# Patient Record
Sex: Female | Born: 1965
Health system: Southern US, Community
[De-identification: ages and names within clinical notes are randomized; demographics above are authoritative.]

## PROBLEM LIST (undated history)

## (undated) DIAGNOSIS — E785 Hyperlipidemia, unspecified: Secondary | ICD-10-CM

## (undated) DIAGNOSIS — T4145XA Adverse effect of unspecified anesthetic, initial encounter: Secondary | ICD-10-CM

## (undated) DIAGNOSIS — T8859XA Other complications of anesthesia, initial encounter: Secondary | ICD-10-CM

## (undated) DIAGNOSIS — I10 Essential (primary) hypertension: Secondary | ICD-10-CM

## (undated) DIAGNOSIS — R112 Nausea with vomiting, unspecified: Secondary | ICD-10-CM

## (undated) DIAGNOSIS — T7840XA Allergy, unspecified, initial encounter: Secondary | ICD-10-CM

## (undated) DIAGNOSIS — Z9889 Other specified postprocedural states: Secondary | ICD-10-CM

## (undated) HISTORY — DX: Hyperlipidemia, unspecified: E78.5

## (undated) HISTORY — DX: Allergy, unspecified, initial encounter: T78.40XA

## (undated) HISTORY — PX: ABDOMINAL HYSTERECTOMY: SHX81

## (undated) HISTORY — DX: Essential (primary) hypertension: I10

## (undated) HISTORY — PX: WISDOM TOOTH EXTRACTION: SHX21

---

## 1998-03-29 ENCOUNTER — Encounter: Admission: RE | Admit: 1998-03-29 | Discharge: 1998-03-29 | Payer: Self-pay | Admitting: *Deleted

## 2003-10-23 ENCOUNTER — Ambulatory Visit (HOSPITAL_COMMUNITY): Admission: RE | Admit: 2003-10-23 | Discharge: 2003-10-23 | Payer: Self-pay | Admitting: Family Medicine

## 2003-12-05 ENCOUNTER — Other Ambulatory Visit: Admission: RE | Admit: 2003-12-05 | Discharge: 2003-12-05 | Payer: Self-pay | Admitting: Obstetrics and Gynecology

## 2004-09-11 ENCOUNTER — Ambulatory Visit: Payer: Self-pay | Admitting: Family Medicine

## 2004-10-16 ENCOUNTER — Ambulatory Visit: Payer: Self-pay | Admitting: Family Medicine

## 2005-06-30 ENCOUNTER — Ambulatory Visit: Payer: Self-pay | Admitting: Family Medicine

## 2005-07-01 ENCOUNTER — Ambulatory Visit (HOSPITAL_COMMUNITY): Admission: RE | Admit: 2005-07-01 | Discharge: 2005-07-01 | Payer: Self-pay | Admitting: Family Medicine

## 2005-09-04 ENCOUNTER — Ambulatory Visit: Payer: Self-pay | Admitting: Family Medicine

## 2005-09-07 ENCOUNTER — Ambulatory Visit (HOSPITAL_COMMUNITY): Admission: RE | Admit: 2005-09-07 | Discharge: 2005-09-07 | Payer: Self-pay | Admitting: Family Medicine

## 2005-10-08 ENCOUNTER — Ambulatory Visit: Payer: Self-pay | Admitting: Family Medicine

## 2005-10-08 ENCOUNTER — Encounter (INDEPENDENT_AMBULATORY_CARE_PROVIDER_SITE_OTHER): Payer: Self-pay | Admitting: *Deleted

## 2005-10-08 LAB — CONVERTED CEMR LAB: Pap Smear: NORMAL

## 2006-03-08 ENCOUNTER — Ambulatory Visit: Payer: Self-pay | Admitting: Family Medicine

## 2006-03-08 ENCOUNTER — Ambulatory Visit (HOSPITAL_COMMUNITY): Admission: RE | Admit: 2006-03-08 | Discharge: 2006-03-08 | Payer: Self-pay | Admitting: Family Medicine

## 2006-03-12 ENCOUNTER — Ambulatory Visit: Payer: Self-pay | Admitting: Family Medicine

## 2006-04-19 ENCOUNTER — Ambulatory Visit: Payer: Self-pay | Admitting: Family Medicine

## 2006-08-10 ENCOUNTER — Ambulatory Visit: Payer: Self-pay | Admitting: Family Medicine

## 2006-11-10 ENCOUNTER — Ambulatory Visit: Payer: Self-pay | Admitting: Family Medicine

## 2006-12-07 ENCOUNTER — Encounter: Payer: Self-pay | Admitting: Family Medicine

## 2006-12-07 LAB — CONVERTED CEMR LAB
ALT: 10 units/L (ref 0–35)
AST: 13 units/L (ref 0–37)
Albumin: 3.9 g/dL (ref 3.5–5.2)
Alkaline Phosphatase: 64 units/L (ref 39–117)
BUN: 9 mg/dL (ref 6–23)
Basophils Absolute: 0 10*3/uL (ref 0.0–0.1)
Basophils Relative: 0 % (ref 0–1)
Bilirubin, Direct: 0.2 mg/dL (ref 0.0–0.3)
CO2: 25 meq/L (ref 19–32)
Calcium: 9.8 mg/dL (ref 8.4–10.5)
Chloride: 100 meq/L (ref 96–112)
Cholesterol: 238 mg/dL — ABNORMAL HIGH (ref 0–200)
Creatinine, Ser: 0.82 mg/dL (ref 0.40–1.20)
Eosinophils Absolute: 0.1 10*3/uL (ref 0.0–0.7)
Eosinophils Relative: 1 % (ref 0–5)
Glucose, Bld: 76 mg/dL (ref 70–99)
HCT: 38.1 % (ref 36.0–46.0)
HDL: 47 mg/dL (ref >39)
Hemoglobin: 12.6 g/dL (ref 12.0–15.0)
Indirect Bilirubin: 0.5 mg/dL (ref 0.0–0.9)
LDL Cholesterol: 171 mg/dL — ABNORMAL HIGH (ref 0–99)
Lymphocytes Relative: 37 % (ref 12–46)
Lymphs Abs: 3 10*3/uL (ref 0.7–3.3)
MCHC: 33.1 g/dL (ref 30.0–36.0)
MCV: 85.4 fL (ref 78.0–100.0)
Monocytes Absolute: 0.6 10*3/uL (ref 0.2–0.7)
Monocytes Relative: 8 % (ref 3–11)
Neutro Abs: 4.4 10*3/uL (ref 1.7–7.7)
Neutrophils Relative %: 55 % (ref 43–77)
Platelets: 257 10*3/uL (ref 150–400)
Potassium: 3.6 meq/L (ref 3.5–5.3)
RBC: 4.46 M/uL (ref 3.87–5.11)
RDW: 13.1 % (ref 11.5–14.0)
Sodium: 138 meq/L (ref 135–145)
Total Bilirubin: 0.7 mg/dL (ref 0.3–1.2)
Total CHOL/HDL Ratio: 5.1
Total Protein: 7.4 g/dL (ref 6.0–8.3)
Triglycerides: 98 mg/dL (ref <150)
VLDL: 20 mg/dL (ref 0–40)
WBC: 8 10*3/uL (ref 4.0–10.5)

## 2006-12-13 ENCOUNTER — Ambulatory Visit: Payer: Self-pay | Admitting: Family Medicine

## 2007-01-24 ENCOUNTER — Ambulatory Visit (HOSPITAL_COMMUNITY): Admission: RE | Admit: 2007-01-24 | Discharge: 2007-01-24 | Payer: Self-pay | Admitting: Family Medicine

## 2007-02-18 ENCOUNTER — Ambulatory Visit: Payer: Self-pay | Admitting: Family Medicine

## 2007-05-24 ENCOUNTER — Encounter: Payer: Self-pay | Admitting: Family Medicine

## 2007-05-24 LAB — CONVERTED CEMR LAB
ALT: 8 units/L (ref 0–35)
AST: 12 units/L (ref 0–37)
Albumin: 4 g/dL (ref 3.5–5.2)
Alkaline Phosphatase: 65 units/L (ref 39–117)
Bilirubin, Direct: 0.1 mg/dL (ref 0.0–0.3)
Cholesterol: 233 mg/dL — ABNORMAL HIGH (ref 0–200)
HDL: 46 mg/dL (ref >39)
Indirect Bilirubin: 0.3 mg/dL (ref 0.0–0.9)
LDL Cholesterol: 168 mg/dL — ABNORMAL HIGH (ref 0–99)
Total Bilirubin: 0.4 mg/dL (ref 0.3–1.2)
Total CHOL/HDL Ratio: 5.1
Total Protein: 6.9 g/dL (ref 6.0–8.3)
Triglycerides: 93 mg/dL (ref <150)
VLDL: 19 mg/dL (ref 0–40)

## 2007-05-31 ENCOUNTER — Ambulatory Visit: Payer: Self-pay | Admitting: Family Medicine

## 2007-09-06 ENCOUNTER — Ambulatory Visit: Payer: Self-pay | Admitting: Family Medicine

## 2007-09-06 LAB — CONVERTED CEMR LAB
ALT: 11 units/L (ref 0–35)
AST: 18 units/L (ref 0–37)
Albumin: 4 g/dL (ref 3.5–5.2)
Alkaline Phosphatase: 67 units/L (ref 39–117)
Bilirubin, Direct: 0.1 mg/dL (ref 0.0–0.3)
Cholesterol: 236 mg/dL — ABNORMAL HIGH (ref 0–200)
HDL: 50 mg/dL (ref >39)
Indirect Bilirubin: 0.6 mg/dL (ref 0.0–0.9)
LDL Cholesterol: 174 mg/dL — ABNORMAL HIGH (ref 0–99)
Total Bilirubin: 0.7 mg/dL (ref 0.3–1.2)
Total CHOL/HDL Ratio: 4.7
Total Protein: 7 g/dL (ref 6.0–8.3)
Triglycerides: 60 mg/dL (ref <150)
VLDL: 12 mg/dL (ref 0–40)

## 2007-10-06 ENCOUNTER — Encounter: Payer: Self-pay | Admitting: Family Medicine

## 2007-10-31 ENCOUNTER — Other Ambulatory Visit: Admission: RE | Admit: 2007-10-31 | Discharge: 2007-10-31 | Payer: Self-pay | Admitting: Obstetrics and Gynecology

## 2007-11-02 ENCOUNTER — Ambulatory Visit: Payer: Self-pay | Admitting: Family Medicine

## 2008-01-02 ENCOUNTER — Encounter: Payer: Self-pay | Admitting: Family Medicine

## 2008-01-02 LAB — CONVERTED CEMR LAB
ALT: 15 U/L
AST: 19 U/L
Albumin: 4.1 g/dL
Alkaline Phosphatase: 59 U/L
BUN: 12 mg/dL
Basophils Absolute: 0 K/uL
Basophils Relative: 0 %
Bilirubin, Direct: 0.1 mg/dL
CO2: 23 meq/L
Calcium: 9.4 mg/dL
Chloride: 102 meq/L
Cholesterol: 256 mg/dL — ABNORMAL HIGH
Creatinine, Ser: 0.95 mg/dL
Eosinophils Absolute: 0.1 K/uL
Eosinophils Relative: 1 %
Glucose, Bld: 76 mg/dL
HCT: 37 %
HDL: 57 mg/dL
Hemoglobin: 12.5 g/dL
Indirect Bilirubin: 0.7 mg/dL
LDL Cholesterol: 181 mg/dL — ABNORMAL HIGH
Lymphocytes Relative: 46 %
Lymphs Abs: 2.8 K/uL
MCHC: 33.8 g/dL
MCV: 88.9 fL
Monocytes Absolute: 0.5 K/uL
Monocytes Relative: 7 %
Neutro Abs: 2.8 K/uL
Neutrophils Relative %: 45 %
Platelets: 219 K/uL
Potassium: 3.4 meq/L — ABNORMAL LOW
RBC: 4.16 M/uL
RDW: 13.8 %
Sodium: 139 meq/L
Total Bilirubin: 0.8 mg/dL
Total CHOL/HDL Ratio: 4.5
Total Protein: 7 g/dL
Triglycerides: 92 mg/dL
VLDL: 18 mg/dL
WBC: 6.1 10*3/microliter

## 2008-01-05 ENCOUNTER — Ambulatory Visit: Payer: Self-pay | Admitting: Family Medicine

## 2008-01-11 ENCOUNTER — Encounter (INDEPENDENT_AMBULATORY_CARE_PROVIDER_SITE_OTHER): Payer: Self-pay | Admitting: *Deleted

## 2008-01-11 DIAGNOSIS — E785 Hyperlipidemia, unspecified: Secondary | ICD-10-CM

## 2008-02-08 ENCOUNTER — Ambulatory Visit (HOSPITAL_COMMUNITY): Admission: RE | Admit: 2008-02-08 | Discharge: 2008-02-08 | Payer: Self-pay | Admitting: Family Medicine

## 2008-04-19 ENCOUNTER — Ambulatory Visit: Payer: Self-pay | Admitting: Family Medicine

## 2008-04-21 ENCOUNTER — Encounter: Payer: Self-pay | Admitting: Family Medicine

## 2008-04-21 LAB — CONVERTED CEMR LAB
ALT: 18 units/L (ref 0–35)
AST: 19 units/L (ref 0–37)
Albumin: 4 g/dL (ref 3.5–5.2)
Alkaline Phosphatase: 64 units/L (ref 39–117)
BUN: 10 mg/dL (ref 6–23)
Bilirubin, Direct: 0.2 mg/dL (ref 0.0–0.3)
CO2: 20 meq/L (ref 19–32)
Calcium: 9.2 mg/dL (ref 8.4–10.5)
Chloride: 106 meq/L (ref 96–112)
Cholesterol: 169 mg/dL (ref 0–200)
Creatinine, Ser: 0.89 mg/dL (ref 0.40–1.20)
Glucose, Bld: 88 mg/dL (ref 70–99)
HDL: 63 mg/dL (ref >39)
Indirect Bilirubin: 0.6 mg/dL (ref 0.0–0.9)
LDL Cholesterol: 94 mg/dL (ref 0–99)
Potassium: 4.3 meq/L (ref 3.5–5.3)
Sodium: 140 meq/L (ref 135–145)
Total Bilirubin: 0.8 mg/dL (ref 0.3–1.2)
Total CHOL/HDL Ratio: 2.7
Total Protein: 6.8 g/dL (ref 6.0–8.3)
Triglycerides: 61 mg/dL (ref <150)
VLDL: 12 mg/dL (ref 0–40)

## 2008-07-27 ENCOUNTER — Telehealth: Payer: Self-pay | Admitting: Family Medicine

## 2008-07-30 ENCOUNTER — Ambulatory Visit (HOSPITAL_COMMUNITY): Admission: RE | Admit: 2008-07-30 | Discharge: 2008-07-30 | Payer: Self-pay | Admitting: Family Medicine

## 2008-07-30 ENCOUNTER — Ambulatory Visit: Payer: Self-pay | Admitting: Family Medicine

## 2008-07-30 LAB — CONVERTED CEMR LAB
Basophils Absolute: 0 10*3/uL (ref 0.0–0.1)
Basophils Relative: 0 % (ref 0–1)
Eosinophils Absolute: 0 10*3/uL (ref 0.0–0.7)
Eosinophils Relative: 0 % (ref 0–5)
HCT: 42.2 % (ref 36.0–46.0)
Hemoglobin: 13.8 g/dL (ref 12.0–15.0)
Lymphocytes Relative: 52 % — ABNORMAL HIGH (ref 12–46)
Lymphs Abs: 2.3 10*3/uL (ref 0.7–4.0)
MCHC: 32.7 g/dL (ref 30.0–36.0)
MCV: 90.2 fL (ref 78.0–100.0)
Monocytes Absolute: 0.4 10*3/uL (ref 0.1–1.0)
Monocytes Relative: 9 % (ref 3–12)
Neutro Abs: 1.7 10*3/uL (ref 1.7–7.7)
Neutrophils Relative %: 38 % — ABNORMAL LOW (ref 43–77)
Platelets: 180 10*3/uL (ref 150–400)
RBC: 4.68 M/uL (ref 3.87–5.11)
RDW: 13.2 % (ref 11.5–15.5)
WBC: 4.5 10*3/uL (ref 4.0–10.5)

## 2008-07-31 ENCOUNTER — Encounter: Payer: Self-pay | Admitting: Family Medicine

## 2009-01-01 ENCOUNTER — Other Ambulatory Visit: Admission: RE | Admit: 2009-01-01 | Discharge: 2009-01-01 | Payer: Self-pay | Admitting: Obstetrics and Gynecology

## 2009-01-10 ENCOUNTER — Ambulatory Visit: Payer: Self-pay | Admitting: Family Medicine

## 2009-01-18 ENCOUNTER — Encounter: Payer: Self-pay | Admitting: Family Medicine

## 2009-01-21 ENCOUNTER — Encounter: Payer: Self-pay | Admitting: Family Medicine

## 2009-01-21 LAB — CONVERTED CEMR LAB
BUN: 8 mg/dL (ref 6–23)
Basophils Absolute: 0 10*3/uL (ref 0.0–0.1)
Basophils Relative: 0 % (ref 0–1)
CO2: 24 meq/L (ref 19–32)
Calcium: 9.4 mg/dL (ref 8.4–10.5)
Chloride: 104 meq/L (ref 96–112)
Cholesterol: 232 mg/dL — ABNORMAL HIGH (ref 0–200)
Creatinine, Ser: 0.9 mg/dL (ref 0.40–1.20)
Eosinophils Absolute: 0.1 10*3/uL (ref 0.0–0.7)
Eosinophils Relative: 1 % (ref 0–5)
Glucose, Bld: 80 mg/dL (ref 70–99)
HCT: 40.3 % (ref 36.0–46.0)
HDL: 61 mg/dL (ref >39)
Hemoglobin: 13.5 g/dL (ref 12.0–15.0)
LDL Cholesterol: 159 mg/dL — ABNORMAL HIGH (ref 0–99)
Lymphocytes Relative: 48 % — ABNORMAL HIGH (ref 12–46)
Lymphs Abs: 2.2 10*3/uL (ref 0.7–4.0)
MCHC: 33.5 g/dL (ref 30.0–36.0)
MCV: 89.2 fL (ref 78.0–100.0)
Monocytes Absolute: 0.3 10*3/uL (ref 0.1–1.0)
Monocytes Relative: 7 % (ref 3–12)
Neutro Abs: 2 10*3/uL (ref 1.7–7.7)
Neutrophils Relative %: 44 % (ref 43–77)
Platelets: 209 10*3/uL (ref 150–400)
Potassium: 4.2 meq/L (ref 3.5–5.3)
RBC: 4.52 M/uL (ref 3.87–5.11)
RDW: 13.8 % (ref 11.5–15.5)
Sodium: 140 meq/L (ref 135–145)
TSH: 1.226 microintl units/mL (ref 0.350–4.500)
Total CHOL/HDL Ratio: 3.8
Triglycerides: 59 mg/dL (ref <150)
VLDL: 12 mg/dL (ref 0–40)
WBC: 4.7 10*3/uL (ref 4.0–10.5)

## 2009-01-22 LAB — CONVERTED CEMR LAB
ALT: 9 units/L (ref 0–35)
AST: 14 units/L (ref 0–37)
Albumin: 4.1 g/dL (ref 3.5–5.2)
Alkaline Phosphatase: 72 units/L (ref 39–117)
Bilirubin, Direct: 0.1 mg/dL (ref 0.0–0.3)
Indirect Bilirubin: 0.3 mg/dL (ref 0.0–0.9)
Total Bilirubin: 0.4 mg/dL (ref 0.3–1.2)
Total Protein: 7.1 g/dL (ref 6.0–8.3)

## 2009-02-06 ENCOUNTER — Telehealth: Payer: Self-pay | Admitting: Family Medicine

## 2010-02-27 ENCOUNTER — Other Ambulatory Visit: Admission: RE | Admit: 2010-02-27 | Discharge: 2010-02-27 | Payer: Self-pay | Admitting: Family Medicine

## 2010-02-27 ENCOUNTER — Ambulatory Visit: Payer: Self-pay | Admitting: Family Medicine

## 2010-02-27 DIAGNOSIS — B351 Tinea unguium: Secondary | ICD-10-CM

## 2010-02-27 DIAGNOSIS — R5383 Other fatigue: Secondary | ICD-10-CM

## 2010-02-27 DIAGNOSIS — R5381 Other malaise: Secondary | ICD-10-CM

## 2010-02-27 DIAGNOSIS — K5909 Other constipation: Secondary | ICD-10-CM

## 2010-02-27 LAB — CONVERTED CEMR LAB: Pap Smear: NEGATIVE

## 2010-03-10 ENCOUNTER — Ambulatory Visit (HOSPITAL_COMMUNITY): Admission: RE | Admit: 2010-03-10 | Discharge: 2010-03-10 | Payer: Self-pay | Admitting: Family Medicine

## 2010-03-11 LAB — CONVERTED CEMR LAB
ALT: 13 units/L (ref 0–35)
AST: 17 units/L (ref 0–37)
Albumin: 4.2 g/dL (ref 3.5–5.2)
Alkaline Phosphatase: 56 units/L (ref 39–117)
BUN: 10 mg/dL (ref 6–23)
Basophils Absolute: 0 10*3/uL (ref 0.0–0.1)
Basophils Relative: 0 % (ref 0–1)
Bilirubin, Direct: 0.2 mg/dL (ref 0.0–0.3)
CO2: 24 meq/L (ref 19–32)
Calcium: 10.1 mg/dL (ref 8.4–10.5)
Chloride: 104 meq/L (ref 96–112)
Cholesterol: 141 mg/dL (ref 0–200)
Creatinine, Ser: 1 mg/dL (ref 0.40–1.20)
Eosinophils Absolute: 0.1 10*3/uL (ref 0.0–0.7)
Eosinophils Relative: 1 % (ref 0–5)
Glucose, Bld: 92 mg/dL (ref 70–99)
HCT: 43.5 % (ref 36.0–46.0)
HDL: 58 mg/dL (ref >39)
Hemoglobin: 14.6 g/dL (ref 12.0–15.0)
Indirect Bilirubin: 0.6 mg/dL (ref 0.0–0.9)
LDL Cholesterol: 72 mg/dL (ref 0–99)
Lymphocytes Relative: 41 % (ref 12–46)
Lymphs Abs: 2.2 10*3/uL (ref 0.7–4.0)
MCHC: 33.6 g/dL (ref 30.0–36.0)
MCV: 89.5 fL (ref 78.0–100.0)
Monocytes Absolute: 0.5 10*3/uL (ref 0.1–1.0)
Monocytes Relative: 8 % (ref 3–12)
Neutro Abs: 2.7 10*3/uL (ref 1.7–7.7)
Neutrophils Relative %: 50 % (ref 43–77)
Platelets: 177 10*3/uL (ref 150–400)
Potassium: 4.7 meq/L (ref 3.5–5.3)
RBC: 4.86 M/uL (ref 3.87–5.11)
RDW: 13.3 % (ref 11.5–15.5)
Sodium: 136 meq/L (ref 135–145)
TSH: 1.481 microintl units/mL (ref 0.350–4.500)
Total Bilirubin: 0.8 mg/dL (ref 0.3–1.2)
Total CHOL/HDL Ratio: 2.4
Total Protein: 7 g/dL (ref 6.0–8.3)
Triglycerides: 56 mg/dL (ref <150)
VLDL: 11 mg/dL (ref 0–40)
Vit D, 25-Hydroxy: 42 ng/mL (ref 30–89)
WBC: 5.5 10*3/uL (ref 4.0–10.5)

## 2010-08-18 ENCOUNTER — Encounter: Payer: Self-pay | Admitting: Family Medicine

## 2010-09-08 ENCOUNTER — Ambulatory Visit: Payer: Self-pay | Admitting: Family Medicine

## 2010-09-08 DIAGNOSIS — J0100 Acute maxillary sinusitis, unspecified: Secondary | ICD-10-CM | POA: Insufficient documentation

## 2010-09-08 DIAGNOSIS — J209 Acute bronchitis, unspecified: Secondary | ICD-10-CM | POA: Insufficient documentation

## 2010-09-08 LAB — CONVERTED CEMR LAB
ALT: 11 units/L (ref 0–35)
AST: 14 units/L (ref 0–37)
Albumin: 4.3 g/dL (ref 3.5–5.2)
Alkaline Phosphatase: 58 units/L (ref 39–117)
Bilirubin, Direct: 0.2 mg/dL (ref 0.0–0.3)
Cholesterol: 156 mg/dL (ref 0–200)
HDL: 56 mg/dL (ref >39)
Indirect Bilirubin: 0.5 mg/dL (ref 0.0–0.9)
LDL Cholesterol: 85 mg/dL (ref 0–99)
Total Bilirubin: 0.7 mg/dL (ref 0.3–1.2)
Total CHOL/HDL Ratio: 2.8
Total Protein: 7.4 g/dL (ref 6.0–8.3)
Triglycerides: 76 mg/dL (ref <150)
VLDL: 15 mg/dL (ref 0–40)

## 2010-09-15 ENCOUNTER — Telehealth: Payer: Self-pay | Admitting: Family Medicine

## 2010-10-26 ENCOUNTER — Encounter: Payer: Self-pay | Admitting: Family Medicine

## 2010-10-27 ENCOUNTER — Encounter: Payer: Self-pay | Admitting: Family Medicine

## 2010-11-04 NOTE — Letter (Signed)
Summary: lab  lab   Imported By: Curtis Sites 03/26/2010 11:55:44  _____________________________________________________________________  External Attachment:    Type:   Image     Comment:   External Document

## 2010-11-04 NOTE — Assessment & Plan Note (Signed)
Summary: F UP   Allergies: No Known Drug Allergies   Complete Medication List: 1)  Crestor 20 Mg Tabs (Rosuvastatin calcium) .... One tab by mouth at bedtime 2)  Terbinafine Hcl 250 Mg Tabs (Terbinafine hcl) .... Take 1 tablet by mouth once a day 3)  Miralax Powd (Polyethylene glycol 3350) .Marland KitchenMarland Kitchen. 17 gms in 8 ounces of water daily 4)  Senokot S 8.6-50 Mg Tabs (Sennosides-docusate sodium) .... Take 1 tablet by mouth two times a day    pt had to leave without being seen

## 2010-11-04 NOTE — Letter (Signed)
Summary: demographic  demographic   Imported By: Curtis Sites 03/26/2010 11:55:05  _____________________________________________________________________  External Attachment:    Type:   Image     Comment:   External Document

## 2010-11-04 NOTE — Letter (Signed)
Summary: xray  xray   Imported By: Curtis Sites 03/26/2010 11:56:49  _____________________________________________________________________  External Attachment:    Type:   Image     Comment:   External Document

## 2010-11-04 NOTE — Letter (Signed)
Summary: history and physical  history and physical   Imported By: Curtis Sites 03/26/2010 11:55:26  _____________________________________________________________________  External Attachment:    Type:   Image     Comment:   External Document

## 2010-11-04 NOTE — Letter (Signed)
Summary: progress notes  progress notes   Imported By: Curtis Sites 03/26/2010 11:56:33  _____________________________________________________________________  External Attachment:    Type:   Image     Comment:   External Document

## 2010-11-04 NOTE — Letter (Signed)
Summary: Out of Work  [facility]  [address]   [place], [place] [address]   Phone: [phone]  Fax: [phone]    September 08, 2010   Employee:  GABRIANA WILMOTT    To Whom It May Concern:   For Medical reasons, please excuse the above named employee from work for the following dates:  Start:   09/08/10  End:   09/09/10 to return with no restrictions  If you need additional information, please feel free to contact our office.         Sincerely,    Milus Mallick. Lodema Hong, MD

## 2010-11-04 NOTE — Assessment & Plan Note (Signed)
Summary: physical   Vital Signs:  Patient profile:   45 year old female Menstrual status:  regular Height:      64 inches Weight:      146 pounds BMI:     25.15 O2 Sat:      98 % Pulse rate:   64 / minute Pulse rhythm:   regular Resp:     16 per minute BP sitting:   112 / 80  (left arm) Cuff size:   regular  Vitals Entered By: Everitt Amber LPN (Feb 27, 2010 2:39 PM)  Nutrition Counseling: Patient's BMI is greater than 25 and therefore counseled on weight management options. CC: CPE   Vision Screening:Left eye with correction: 20 / 20 Right eye with correction: 20 / 20 Both eyes with correction: 20 / 20  Color vision testing: normal      Vision Entered By: Everitt Amber LPN (Feb 27, 2010 2:40 PM)   CC:  CPE .  History of Present Illness: Reports  thatshe has been doing well. Denies recent fever or chills. Denies sinus pressure, nasal congestion , ear pain or sore throat. Denies chest congestion, or cough productive of sputum. Denies chest pain, palpitations, PND, orthopnea or leg swelling. Denies abdominal pain, nausea, vomitting, diarrhea she has chronic constipation for years. Denies change in bowel movements or bloody stool. Denies dysuria , frequency, incontinence or hesitancy. Denies  joint pain, swelling, or reduced mobility. Denies headaches, vertigo, seizures. Denies depression, anxiety or insomnia. Denies  rash, lesions, or itch.c/o thickening of the nails on her left foot 4th and 5th     Current Medications (verified): 1)  Crestor 20 Mg  Tabs (Rosuvastatin Calcium) .... One Tab By Mouth At Bedtime  Allergies (verified): No Known Drug Allergies  Review of Systems      See HPI Eyes:  Denies blurring and discharge. GI:  Complains of constipation; chronic constipation for years, hasbeen using colon cleanser with some relief. Derm:  Complains of changes in nail beds; thickened toenails x 1 yr. Endo:  Denies cold intolerance, excessive hunger, excessive  thirst, excessive urination, heat intolerance, polyuria, and weight change. Heme:  Denies abnormal bruising and bleeding. Allergy:  Denies hives or rash and itching eyes.  Physical Exam  General:  Well-developed,well-nourished,in no acute distress; alert,appropriate and cooperative throughout examination Head:  Normocephalic and atraumatic without obvious abnormalities. No apparent alopecia or balding. Eyes:  No corneal or conjunctival inflammation noted. EOMI. Perrla. Funduscopic exam benign, without hemorrhages, exudates or papilledema. Vision grossly normal. Ears:  External ear exam shows no significant lesions or deformities.  Otoscopic examination reveals clear canals, tympanic membranes are intact bilaterally without bulging, retraction, inflammation or discharge. Hearing is grossly normal bilaterally. Nose:  External nasal examination shows no deformity or inflammation. Nasal mucosa are pink and moist without lesions or exudates. Mouth:  Oral mucosa and oropharynx without lesions or exudates.  Teeth in good repair. Neck:  No deformities, masses, or tenderness noted. Chest Wall:  No deformities, masses, or tenderness noted. Breasts:  No mass, nodules, thickening, tenderness, bulging, retraction, inflamation, nipple discharge or skin changes noted.   Lungs:  Normal respiratory effort, chest expands symmetrically. Lungs are clear to auscultation, no crackles or wheezes. Heart:  Normal rate and regular rhythm. S1 and S2 normal without gallop, murmur, click, rub or other extra sounds. Abdomen:  Bowel sounds positive,abdomen soft and non-tender without masses, organomegaly or hernias noted. Rectal:  No external abnormalities noted. Normal sphincter tone. No rectal masses or tenderness. Genitalia:  Normal introitus for age, no external lesions, no vaginal discharge, mucosa pink and moist, no vaginal or cervical lesions, no vaginal atrophy, no friaility or hemorrhage, normal uterus size and  position, no adnexal masses or tenderness Msk:  No deformity or scoliosis noted of thoracic or lumbar spine.   Pulses:  R and L carotid,radial,femoral,dorsalis pedis and posterior tibial pulses are full and equal bilaterally Extremities:  No clubbing, cyanosis, edema, or deformity noted with normal full range of motion of all joints.   Neurologic:  No cranial nerve deficits noted. Station and gait are normal. Plantar reflexes are down-going bilaterally. DTRs are symmetrical throughout. Sensory, motor and coordinative functions appear intact. Skin:  Intact without suspicious lesions or rashesonychomycosis, mild Cervical Nodes:  No lymphadenopathy noted Axillary Nodes:  No palpable lymphadenopathy Inguinal Nodes:  No significant adenopathy Psych:  Cognition and judgment appear intact. Alert and cooperative with normal attention span and concentration. No apparent delusions, illusions, hallucinations   Impression & Recommendations:  Problem # 1:  SPECIAL SCREENING FOR MALIGNANT NEOPLASMS COLON (ICD-V76.51) Assessment Comment Only  Orders: Hemoccult Guaiac-1 spec.(in office) (82270)  Problem # 2:  SCREENING FOR MALIGNANT NEOPLASM OF THE CERVIX (ICD-V76.2) Assessment: Comment Only  Orders: Pap Smear ([address]109)  Problem # 3:  HYPERLIPIDEMIA (ICD-272.4) Assessment: Comment Only  Her updated medication list for this problem includes:    Crestor 20 Mg Tabs (Rosuvastatin calcium) ..... One tab by mouth at bedtime  Orders: T-Hepatic Function [id number]) T-Lipid Profile [phone])  Labs Reviewed: SGOT: 14 (01/21/2009)   SGPT: 9 (01/21/2009)low fat dietdiscussed and encouraged   HDL:61 (01/18/2009), 63 (04/21/2008)  LDL:159 (01/18/2009), 94 (13/05/6577)  Chol:232 (01/18/2009), 169 (04/21/2008)  Trig:59 (01/18/2009), 61 (04/21/2008)  Problem # 4:  ONYCHOMYCOSIS (ICD-110.1) Assessment: Comment Only  The following medications were removed from the medication list:    Diflucan 100 Mg  Tabs (Fluconazole) .Marland Kitchen... Take 1 tablet by mouth once a day as needed Her updated medication list for this problem includes:    Terbinafine Hcl 250 Mg Tabs (Terbinafine hcl) .Marland Kitchen... Take 1 tablet by mouth once a day  Problem # 5:  CONSTIPATION, CHRONIC (ICD-564.09) Assessment: Unchanged  Her updated medication list for this problem includes:    Miralax Powd (Polyethylene glycol 3350) .Marland KitchenMarland KitchenMarland KitchenMarland Kitchen 17 gms in 8 ounces of water daily    Senokot S 8.6-50 Mg Tabs (Sennosides-docusate sodium) .Marland Kitchen... Take 1 tablet by mouth two times a day  Discussed dietary fiber measures and increased water intake.   Complete Medication List: 1)  Crestor 20 Mg Tabs (Rosuvastatin calcium) .... One tab by mouth at bedtime 2)  Terbinafine Hcl 250 Mg Tabs (Terbinafine hcl) .... Take 1 tablet by mouth once a day 3)  Miralax Powd (Polyethylene glycol 3350) .Marland KitchenMarland Kitchen. 17 gms in 8 ounces of water daily 4)  Senokot S 8.6-50 Mg Tabs (Sennosides-docusate sodium) .... Take 1 tablet by mouth two times a day  Other Orders: T-Basic Metabolic Panel [phone]) T-TSH [phone]) T-CBC w/Diff [phone]) T-Vitamin D (25-Hydroxy) [id number]) Radiology Referral (Radiology)  Patient Instructions: 1)  F/u in 5 months and 3 weeks. 2)  It is important that you exercise regularly at least 20 minutes 5 times a week. If you develop chest pain, have severe difficulty breathing, or feel very tired , stop exercising immediately and seek medical attention. 3)  You need to lose weight. Consider a lower calorie diet and regular exercise.  4)  med is written for fungal nail infection, we will give this to you. 5)  BMP prior to  visit, ICD-9: 6)  Hepatic Panel prior to visit, ICD-9: 7)  Lipid Panel prior to visit, ICD-9: 8)  TSH prior to visit, ICD-9:   fasting next week Friday or after. 9)  your mamo isto be scheduled opn your way out 10)  uily, senokot s one twice daily and ensure you drink at least 8 glasses waterdaily, and eat alot of veg and  fruit, call for gI referral ifno better 11)  CBC w/ Diff prior to visit, ICD-9: 12)  vitamin d Prescriptions: SENOKOT S 8.6-50 MG TABS (SENNOSIDES-DOCUSATE SODIUM) Take 1 tablet by mouth two times a day  #60 x 5   Entered and Authorized by:   Syliva Overman MD   Signed by:   Syliva Overman MD on 02/27/2010   Method used:   [facility] to        [facility] [place] [phone]* (retail)       [address]       [place], [place]  [address]       Ph: [phone] or [phone]       Fax: [phone]   RxID:   [id number] MIRALAX  POWD (POLYETHYLENE GLYCOL 3350) 17 gms in 8 ounces of water daily  #510 gm x 5   Entered and Authorized by:   Syliva Overman MD   Signed by:   Syliva Overman MD on 02/27/2010   Method used:   [facility]ically to        [facility] [phone]* (retail)       [address]       [place], [place]  [address]       Ph: [phone] or [phone]       Fax: [phone]   RxID:   [id number] TERBINAFINE HCL 250 MG TABS (TERBINAFINE HCL) Take 1 tablet by mouth once a day  #90 x 0   Entered and Authorized by:   Syliva Overman MD   Signed by:   Syliva Overman MD on 02/27/2010   Method used:   Print then Give to Patient   RxID:   [id number]   Laboratory Results    Stool - Occult Blood Date: 02/27/2010 Comments: [id number] 9r 8/11 118 10 12

## 2010-11-04 NOTE — Letter (Signed)
Summary: misc.  misc.   Imported By: Curtis Sites 03/26/2010 11:56:00  _____________________________________________________________________  External Attachment:    Type:   Image     Comment:   External Document

## 2010-11-06 NOTE — Assessment & Plan Note (Signed)
Summary: SINUS   Vital Signs:  Patient profile:   45 year old female Menstrual status:  regular Height:      64 inches Weight:      140 pounds BMI:     24.12 O2 Sat:      99 % on Room air Pulse rate:   99 / minute Pulse rhythm:   regular Resp:     16 per minute BP sitting:   110 / 70  (left arm)  Vitals Entered By: Adella Hare LPN (September 08, 2010 9:15 AM)  O2 Flow:  Room air CC: head congestion, cough -x 3 weeks Is Patient Diabetic? No Pain Assessment Patient in pain? no        CC:  head congestion and cough -x 3 weeks.  History of Present Illness: 3 week h/o yellow green thick sinus drainage seemed to have been responding to mucinex but worsened. Sore throat, cough productive of yellow sputum, fever and chills to a temp of 99.9. Reports  that she had been doing well prior to this  Denies chest pain, palpitations, PND, orthopnea or leg swelling. Denies abdominal pain, nausea, vomitting, diarrhea or constipation. Denies change in bowel movements or bloody stool. Denies dysuria , frequency, incontinence or hesitancy. Denies  joint pain, swelling, or reduced mobility. Denies headaches, vertigo, seizures. Denies depression, anxiety or insomnia. Denies  rash, lesions, or itch.      Current Medications (verified): 1)  Crestor 20 Mg  Tabs (Rosuvastatin Calcium) .... One Tab By Mouth At Bedtime 2)  Miralax  Powd (Polyethylene Glycol 3350) .Marland KitchenMarland KitchenMarland Kitchen 17 Gms in 8 Ounces of Water Daily 3)  Senokot S 8.6-50 Mg Tabs (Sennosides-Docusate Sodium) .... Take 1 Tablet By Mouth Two Times A Day  Allergies (verified): No Known Drug Allergies  Review of Systems      See HPI General:  Complains of chills, fatigue, fever, malaise, and sweats. Eyes:  Denies blurring, discharge, eye pain, and red eye. Neuro:  Complains of headaches. Psych:  Denies anxiety and depression. Endo:  Denies excessive hunger, excessive thirst, and excessive urination. Heme:  Denies abnormal bruising and  bleeding. Allergy:  Denies hives or rash and itching eyes.  Physical Exam  General:  Well-developed,well-nourished,in no acute distress; alert,appropriate and cooperative throughout examination HEENT: No facial asymmetry,  EOMI, No sinus tenderness, TM's Clear, oropharynx  pink and moist.   Chest: decreased air entry bilateral crackles no wheezes CVS: S1, S2, No murmurs, No S3.   Abd: Soft, Nontender.  MS: Adequate ROM spine, hips, shoulders and knees.  Ext: No edema.   CNS: CN 2-12 intact, power tone and sensation normal throughout.   Skin: Intact, no visible lesions or rashes.  Psych: Good eye contact, normal affect.  Memory intact, not anxious or depressed appearing.    Impression & Recommendations:  Problem # 1:  ACUTE BRONCHITIS (ICD-466.0) Assessment Comment Only  Her updated medication list for this problem includes:    Levaquin 750 Mg Tabs (Levofloxacin) .Marland Kitchen... Take 1 tablet by mouth once a day  Orders: Rocephin  250mg  (G3151) Admin of Therapeutic Inj  intramuscular or subcutaneous (76160)  Problem # 2:  ACUTE SINUSITIS, UNSPECIFIED (ICD-461.9) Assessment: Comment Only  Her updated medication list for this problem includes:    Levaquin 750 Mg Tabs (Levofloxacin) .Marland Kitchen... Take 1 tablet by mouth once a day  Problem # 3:  HYPERLIPIDEMIA (ICD-272.4) Assessment: Comment Only  Her updated medication list for this problem includes:    Crestor 20 Mg Tabs (Rosuvastatin  calcium) ..... One tab by mouth at bedtime  Orders: T-Lipid Profile [phone]) T-Hepatic Function [phone]) Low fat dietdiscussed and encouraged  Labs Reviewed: SGOT: 17 (03/08/2010)   SGPT: 13 (03/08/2010)   HDL:58 (03/08/2010), 61 (01/18/2009)  LDL:72 (03/08/2010), 159 (30/86/5784)  Chol:141 (03/08/2010), 232 (01/18/2009)  Trig:56 (03/08/2010), 59 (01/18/2009)  Complete Medication List: 1)  Crestor 20 Mg Tabs (Rosuvastatin calcium) .... One tab by mouth at bedtime 2)  Miralax Powd (Polyethylene  glycol 3350) .Marland KitchenMarland Kitchen. 17 gms in 8 ounces of water daily 3)  Senokot S 8.6-50 Mg Tabs (Sennosides-docusate sodium) .... Take 1 tablet by mouth two times a day 4)  Levaquin 750 Mg Tabs (Levofloxacin) .... Take 1 tablet by mouth once a day 5)  Fluconazole 150 Mg Tabs (Fluconazole) .... Take 1 tablet by mouth once a day as needed for vaginal itch  Patient Instructions: 1)  Follow up appointment in 5.51months 2)  You are being treated for acute sinustis and bronchitis. 3)  You will get Rocephin in the opffice, and meds are sent to your pharmacy also, pls take the antibiotics as prescribed. 4)  Take 650-1000mg  of Tylenol every 4-6 hours as needed for relief of pain or comfort of fever AVOID taking more than 4000mg   in a 24 hour period (can cause liver damage in higher doses). 5)  Take 400-600mg  of Ibuprofen (Advil, Motrin) with food every 4-6 hours as needed for relief of pain or comfort of fever. 6)  Drink alot of fluids and get bed rest. 7)  We will call with any med adjustments 8)  Hepatic Panel prior to visit, ICD-9: and lipid panel 9)  Work excuse for today Prescriptions: FLUCONAZOLE 150 MG TABS (FLUCONAZOLE) Take 1 tablet by mouth once a day as needed for vaginal itch  #3 x 0   Entered and Authorized by:   Syliva Overman MD   Signed by:   Syliva Overman MD on 09/08/2010   Method used:   [facility] to        [facility]. [phone]* (retail)       [address]       [place], [place]  [address]       Ph: [phone] or [phone]       Fax: [phone]   RxID:   [id number] LEVAQUIN 750 MG TABS (LEVOFLOXACIN) Take 1 tablet by mouth once a day  #5 x 0   Entered and Authorized by:   Syliva Overman MD   Signed by:   Syliva Overman MD on 09/08/2010   Method used:   Electronically to        [facility]. [phone]* (retail)       [address]       [place], [place]  [address]       Ph: [phone] or [phone]       Fax: [phone]   RxID:    [id number]    Medication Administration  Injection # 1:    Medication: Rocephin  250mg     Diagnosis: ACUTE BRONCHITIS (ICD-466.0)    Route: IM    Site: L thigh    Exp Date: 01/14    Lot #: [id number]    Mfr: [email or web address]    Comments: rocephin 500mg  given    Patient tolerated injection without complications    Given by: Adella Hare LPN (September 08, 2010 9:52 AM)  Orders Added: 1)  Est.  Patient Level IV [[address]109] 2)  T-Lipid Profile [80061-22930] 3)  T-Hepatic Function [80076-22960] 4)  Rocephin  250mg  [J0696] 5)  Admin of Therapeutic Inj  intramuscular or subcutaneous [96372]     Medication Administration  Injection # 1:    Medication: Rocephin  250mg     Diagnosis: ACUTE BRONCHITIS (ICD-466.0)    Route: IM    Site: L thigh    Exp Date: 01/14    Lot #: [id number]    Mfr: [email or web address]    Comments: rocephin 500mg  given    Patient tolerated injection without complications    Given by: Adella Hare LPN (September 08, 2010 9:52 AM)  Orders Added: 1)  Est. Patient Level IV [98119] 2)  T-Lipid Profile [[id number]] 3)  T-Hepatic Function [30865-78469] 4)  Rocephin  250mg  [J0696] 5)  Admin of Therapeutic Inj  intramuscular or subcutaneous [62952]

## 2010-11-06 NOTE — Progress Notes (Signed)
Summary: please advise  Phone Note Call from Patient   Summary of Call: patient called in and states she thinks she might need another prescription of antibiotic called in she states she still has green mucus after taking her 5 days of antibiotic.  [phone] is the best number to contact her at.  Initial call taken by: Curtis Sites,  September 15, 2010 10:36 AM  Follow-up for Phone Call        pls let her know the antibiotic is still in her system, and in the next 5 days she should be better. If concerned that "no better' i suggest sputum culture be sent to see if she has a bacteria which is resistant to the med, pls order and have her collect container ifshe thinks this is necessary, I tried to call yesterday, no response Follow-up by: Syliva Overman MD,  September 16, 2010 4:47 AM  Additional Follow-up for Phone Call Additional follow up Details #1::        left msg for patient to return my call. Additional Follow-up by: Curtis Sites,  September 16, 2010 10:10 AM    Additional Follow-up for Phone Call Additional follow up Details #2::    PATIENT RETURNING CALL ADVISE PATIENT WHAT DR. Lodema Hong HAS REQUESTED FOR HER TO DO IF NOT BETTER IN 5 DAYS GIVE CALL BACK   Additional Follow-up for Phone Call Additional follow up Details #3:: Additional Follow-up by: Eugenio Hoes,  September 16, 2010 10:26 AM

## 2010-11-07 NOTE — Letter (Signed)
Summary: phone notes  phone notes   Imported By: Curtis Sites 03/26/2010 11:56:17  _____________________________________________________________________  External Attachment:    Type:   Image     Comment:   External Document

## 2010-12-04 ENCOUNTER — Telehealth: Payer: Self-pay | Admitting: Family Medicine

## 2010-12-10 ENCOUNTER — Ambulatory Visit: Payer: Self-pay | Admitting: Gastroenterology

## 2010-12-11 NOTE — Progress Notes (Signed)
Summary: lab  Phone Note Call from Patient   Summary of Call: pt would like to get a copy of her lab work. [phone] last time she was in.  Initial call taken by: Rudene Anda,  December 04, 2010 1:27 PM  Follow-up for Phone Call        called patient, left message mailed results Follow-up by: Adella Hare LPN,  December 05, 2010 2:27 PM

## 2011-04-23 ENCOUNTER — Other Ambulatory Visit: Payer: Self-pay | Admitting: Family Medicine

## 2011-04-23 DIAGNOSIS — Z139 Encounter for screening, unspecified: Secondary | ICD-10-CM

## 2011-04-27 ENCOUNTER — Ambulatory Visit (HOSPITAL_COMMUNITY)
Admission: RE | Admit: 2011-04-27 | Discharge: 2011-04-27 | Disposition: A | Discharge status: Home / Self Care | Payer: 59 | Source: Ambulatory Visit | Attending: Family Medicine | Admitting: Family Medicine

## 2011-04-27 DIAGNOSIS — Z139 Encounter for screening, unspecified: Secondary | ICD-10-CM

## 2011-04-27 DIAGNOSIS — Z1231 Encounter for screening mammogram for malignant neoplasm of breast: Secondary | ICD-10-CM | POA: Insufficient documentation

## 2011-05-18 ENCOUNTER — Encounter: Payer: Self-pay | Admitting: Family Medicine

## 2011-05-18 ENCOUNTER — Other Ambulatory Visit (HOSPITAL_COMMUNITY)
Admission: RE | Admit: 2011-05-18 | Discharge: 2011-05-18 | Disposition: A | Discharge status: Home / Self Care | Payer: 59 | Source: Ambulatory Visit | Attending: Family Medicine | Admitting: Family Medicine

## 2011-05-18 ENCOUNTER — Encounter: Payer: Self-pay | Admitting: *Deleted

## 2011-05-18 ENCOUNTER — Ambulatory Visit (INDEPENDENT_AMBULATORY_CARE_PROVIDER_SITE_OTHER): Payer: 59 | Admitting: Family Medicine

## 2011-05-18 VITALS — BP 130/80 | HR 54 | Ht 64.0 in | Wt 145.1 lb

## 2011-05-18 DIAGNOSIS — E785 Hyperlipidemia, unspecified: Secondary | ICD-10-CM

## 2011-05-18 DIAGNOSIS — K5909 Other constipation: Secondary | ICD-10-CM

## 2011-05-18 DIAGNOSIS — Z0000 Encounter for general adult medical examination without abnormal findings: Secondary | ICD-10-CM

## 2011-05-18 DIAGNOSIS — R5381 Other malaise: Secondary | ICD-10-CM

## 2011-05-18 DIAGNOSIS — Z1211 Encounter for screening for malignant neoplasm of colon: Secondary | ICD-10-CM

## 2011-05-18 DIAGNOSIS — Z124 Encounter for screening for malignant neoplasm of cervix: Secondary | ICD-10-CM

## 2011-05-18 DIAGNOSIS — I10 Essential (primary) hypertension: Secondary | ICD-10-CM

## 2011-05-18 DIAGNOSIS — Z13820 Encounter for screening for osteoporosis: Secondary | ICD-10-CM

## 2011-05-18 DIAGNOSIS — Z01419 Encounter for gynecological examination (general) (routine) without abnormal findings: Secondary | ICD-10-CM | POA: Insufficient documentation

## 2011-05-18 DIAGNOSIS — R5383 Other fatigue: Secondary | ICD-10-CM

## 2011-05-18 LAB — POC HEMOCCULT BLD/STL (OFFICE/1-CARD/DIAGNOSTIC): Fecal Occult Blood, POC: NEGATIVE

## 2011-05-18 MED ORDER — SENNOSIDES 8.6 MG PO TABS: 1.0000 | ORAL_TABLET | Freq: Two times a day (BID) | ORAL | Status: AC

## 2011-05-18 MED ORDER — DOCUSATE SODIUM 100 MG PO CAPS: 100.0000 mg | ORAL_CAPSULE | Freq: Two times a day (BID) | ORAL | Status: AC

## 2011-05-18 NOTE — Assessment & Plan Note (Signed)
Controlled, no change in medication  

## 2011-05-18 NOTE — Assessment & Plan Note (Signed)
Unchanged, passes little balls of stool at times, will continue daily fiber

## 2011-05-18 NOTE — Patient Instructions (Signed)
F/U in March 12, 2012 or after.  Keep up the great exercise and dietary habits.  Weight goal is 135 to 140 pounds.  Fasting labs December 6 or after.  Fasting lipid and hepatic in June 2013 3 to 7 days before visit.  You will get coupon for the crestor, and do not take metamucil with senokot if your ins will help cover that and is is more affordable

## 2011-05-18 NOTE — Progress Notes (Signed)
  Subjective:    Patient ID: Cheyenne Harris, female    DOB: 1966-06-19, 45 y.o.   MRN: [id number]  HPI The PT is here for annual exam  and re-evaluation of chronic medical conditions, medication management and review of any available recent lab and radiology data.  Preventive health is updated, specifically  Cancer screening and Immunization.   Questions or concerns regarding consultations or procedures which the PT has had in the interim are  addressed. The PT denies any adverse reactions to current medications since the last visit.  There are no new concerns.  There are no specific complaints       Review of Systems Denies recent fever or chills. Denies sinus pressure, nasal congestion, ear pain or sore throat. Denies chest congestion, productive cough or wheezing. Denies chest pains, palpitations and leg swelling Denies abdominal pain, nausea, vomiting,diarrhea or constipation.   Denies dysuria, frequency, hesitancy or incontinence. Denies joint pain, swelling and limitation in mobility. Denies headaches, seizures, numbness, or tingling. Denies depression, anxiety or insomnia. Denies skin break down or rash.       Objective:   Physical Exam Pleasant well nourished female, alert and oriented x 3, in no cardio-pulmonary distress. Afebrile. HEENT No facial trauma or asymetry. Sinuses non tender.  EOMI, PERTL. External ears normal, tympanic membranes clear. Oropharynx moist, no exudate, good dentition. Neck: supple, no adenopathy,JVD or thyromegaly.No bruits.  Chest: Clear to ascultation bilaterally.No crackles or wheezes. Non tender to palpation  Breast: No asymetry,no masses. No nipple discharge or inversion. No axillary or supraclavicular adenopathy  Cardiovascular system; Heart sounds normal,  S1 and  S2 ,no S3.  No murmur, or thrill. Apical beat not displaced Peripheral pulses normal.  Abdomen: Soft, non tender, no organomegaly or masses. No bruits. Bowel  sounds normal. No guarding, tenderness or rebound.  Rectal:  No mass. Guaiac negative stool.  GU: External genitalia normal. No lesions. Vaginal canal normal.No discharge. Uterus normal size, no adnexal masses, no cervical motion or adnexal tenderness.  Musculoskeletal exam: Full ROM of spine, hips , shoulders and knees. No deformity ,swelling or crepitus noted. No muscle wasting or atrophy.   Neurologic: Cranial nerves 2 to 12 intact. Power, tone ,sensation and reflexes normal throughout. No disturbance in gait. No tremor.  Skin: Intact, no ulceration, erythema , scaling or rash noted. Pigmentation normal throughout  Psych; Normal mood and affect. Judgement and concentration normal        Assessment & Plan:

## 2011-05-20 ENCOUNTER — Encounter: Payer: Self-pay | Admitting: *Deleted

## 2011-09-12 LAB — HEPATIC FUNCTION PANEL
ALT: 9 U/L (ref 0–35)
AST: 19 U/L (ref 0–37)
Albumin: 4 g/dL (ref 3.5–5.2)
Alkaline Phosphatase: 47 U/L (ref 39–117)
Bilirubin, Direct: 0.1 mg/dL (ref 0.0–0.3)
Indirect Bilirubin: 0.5 mg/dL (ref 0.0–0.9)
Total Bilirubin: 0.6 mg/dL (ref 0.3–1.2)
Total Protein: 7 g/dL (ref 6.0–8.3)

## 2011-09-12 LAB — CBC WITH DIFFERENTIAL/PLATELET
Basophils Absolute: 0 10*3/uL (ref 0.0–0.1)
Eosinophils Absolute: 0 10*3/uL (ref 0.0–0.7)
Eosinophils Relative: 1 % (ref 0–5)
HCT: 41.1 % (ref 36.0–46.0)
Hemoglobin: 13.9 g/dL (ref 12.0–15.0)
Lymphocytes Relative: 29 % (ref 12–46)
Lymphs Abs: 1.7 10*3/uL (ref 0.7–4.0)
MCH: 29.9 pg (ref 26.0–34.0)
MCHC: 33.8 g/dL (ref 30.0–36.0)
MCV: 88.4 fL (ref 78.0–100.0)
Monocytes Absolute: 0.4 10*3/uL (ref 0.1–1.0)
Monocytes Relative: 6 % (ref 3–12)
Neutrophils Relative %: 65 % (ref 43–77)
Platelets: 192 10*3/uL (ref 150–400)
RBC: 4.65 MIL/uL (ref 3.87–5.11)
RDW: 13.1 % (ref 11.5–15.5)
WBC: 6 10*3/uL (ref 4.0–10.5)

## 2011-09-12 LAB — LIPID PANEL
Cholesterol: 152 mg/dL (ref 0–200)
HDL: 59 mg/dL (ref >39)
LDL Cholesterol: 85 mg/dL (ref 0–99)
Total CHOL/HDL Ratio: 2.6 Ratio
Triglycerides: 40 mg/dL (ref <150)
VLDL: 8 mg/dL (ref 0–40)

## 2011-09-12 LAB — BASIC METABOLIC PANEL
BUN: 13 mg/dL (ref 6–23)
CO2: 25 mEq/L (ref 19–32)
Calcium: 9.5 mg/dL (ref 8.4–10.5)
Chloride: 103 mEq/L (ref 96–112)
Creat: 0.9 mg/dL (ref 0.50–1.10)
Glucose, Bld: 65 mg/dL — ABNORMAL LOW (ref 70–99)
Sodium: 139 mEq/L (ref 135–145)

## 2011-09-12 LAB — TSH: TSH: 1.005 u[IU]/mL (ref 0.350–4.500)

## 2011-09-17 LAB — VITAMIN D 1,25 DIHYDROXY
Vitamin D 1, 25 (OH)2 Total: 77 pg/mL — ABNORMAL HIGH (ref 18–72)
Vitamin D2 1, 25 (OH)2: <8 pg/mL
Vitamin D3 1, 25 (OH)2: 77 pg/mL

## 2011-09-21 ENCOUNTER — Telehealth: Payer: Self-pay | Admitting: Family Medicine

## 2011-09-21 NOTE — Telephone Encounter (Signed)
Labs mailed to pt on 09/18/2011

## 2011-10-07 ENCOUNTER — Telehealth: Payer: Self-pay | Admitting: Family Medicine

## 2011-10-07 NOTE — Telephone Encounter (Signed)
States she has had a sinus headache and yellow drainage for over a week. She knows its a sinus infection and wants something sent in since there are no appts  [facility]

## 2011-10-07 NOTE — Telephone Encounter (Signed)
pls advise saline nasal washes , sudafed and claritin. Ask scheduling to give work in appt for 01/07 if pt agrees.  I will not prescribe abiotics without Ov. If unable to wait till Monday, I suggest urgent care, unless we have a cancellation in the next 2 days, and she will be called in

## 2011-10-07 NOTE — Telephone Encounter (Signed)
Called patient, no answer 

## 2011-10-08 NOTE — Telephone Encounter (Signed)
Called and left message to return call. 

## 2011-10-08 NOTE — Telephone Encounter (Signed)
Pt stated that she is using nasal washes and sudafed. She states that she will go to urgent care.

## 2012-01-13 ENCOUNTER — Ambulatory Visit (INDEPENDENT_AMBULATORY_CARE_PROVIDER_SITE_OTHER): Payer: 59 | Admitting: Family Medicine

## 2012-01-13 ENCOUNTER — Encounter: Payer: Self-pay | Admitting: Family Medicine

## 2012-01-13 VITALS — BP 118/80 | HR 76 | Resp 15 | Ht 64.0 in | Wt 147.0 lb

## 2012-01-13 DIAGNOSIS — M179 Osteoarthritis of knee, unspecified: Secondary | ICD-10-CM

## 2012-01-13 DIAGNOSIS — M1710 Unilateral primary osteoarthritis, unspecified knee: Secondary | ICD-10-CM

## 2012-01-13 DIAGNOSIS — Z3040 Encounter for surveillance of contraceptives, unspecified: Secondary | ICD-10-CM

## 2012-01-13 DIAGNOSIS — R5381 Other malaise: Secondary | ICD-10-CM

## 2012-01-13 DIAGNOSIS — E785 Hyperlipidemia, unspecified: Secondary | ICD-10-CM

## 2012-01-13 DIAGNOSIS — R5383 Other fatigue: Secondary | ICD-10-CM

## 2012-01-13 MED ORDER — NORETHIN-ETH ESTRAD TRIPHASIC 0.5/0.75/1-35 MG-MCG PO TABS: 1.0000 | ORAL_TABLET | Freq: Every day | ORAL | Status: DC

## 2012-01-13 NOTE — Progress Notes (Signed)
  Subjective:    Patient ID: Cheyenne Harris, female    DOB: 1965/11/11, 46 y.o.   MRN: [id number]  HPI The PT is here for follow up and re-evaluation of chronic medical conditions, medication management and review of any available recent lab and radiology data.  Preventive health is updated, specifically  Cancer screening and Immunization.   Questions or concerns regarding consultations or procedures which the PT has had in the interim are  addressed. The PT denies any adverse reactions to current medications since the last visit.  3 to 4 month h/o increased bilateral knee pain and groin pain. Was running 25 miles per week has had to reduce to about 15. Pain and stiffness are worse after prolonged immobility. Requests contraceptive pills short term, so she will have no menstrual cycle in May when she is away on vacation. She is a non smoker and has no DVT history. Reports cycles every 24 days and they are heavy     Review of Systems See HPI Denies recent fever or chills. Denies sinus pressure, nasal congestion, ear pain or sore throat. Denies chest congestion, productive cough or wheezing. Denies chest pains, palpitations and leg swelling Denies abdominal pain, nausea, vomiting,diarrhea or constipation.   Denies dysuria, frequency, hesitancy or incontinence.  Denies headaches, seizures, numbness, or tingling. Denies depression, anxiety or insomnia. Denies skin break down or rash.        Objective:   Physical Exam Patient alert and oriented and in no cardiopulmonary distress.  HEENT: No facial asymmetry, EOMI, no sinus tenderness,  oropharynx pink and moist.  Neck supple no adenopathy.  Chest: Clear to auscultation bilaterally.  CVS: S1, S2 no murmurs, no S3.  ABD: Soft non tender. Bowel sounds normal.  Ext: No edema  MS: Adequate ROM spine, shoulders, hips and knees.  Skin: Intact, no ulcerations or rash noted.  Psych: Good eye contact, normal affect. Memory intact  not anxious or depressed appearing.  CNS: CN 2-12 intact, power, tone and sensation normal throughout.        Assessment & Plan:

## 2012-01-13 NOTE — Assessment & Plan Note (Signed)
Hyperlipidemia:Low fat diet discussed and encouraged.  Updated labs before next visit, continue current medication

## 2012-01-13 NOTE — Patient Instructions (Signed)
CPE August 13 or after, please cancel June appointment.   Start OCP on day 1 of your next cycle. Take the first 3 weeks, the go to the 2nd pack and take only the active pills , the first 3 weeks.You will bleed once you start week 4 of cycle  2 or 3 , whichever you chose. Enjoy vacation!  CBc, fastin lipid, cmp and TSH in August before you return.  Mammogram due in July, please schedule.  Ibuprofen 200mg  four tablets twice daily for 4 days then once daily as needed. Try to incorporate less weight bearing exercise as well

## 2012-01-13 NOTE — Assessment & Plan Note (Signed)
3 months of oral contraceptives prescribed, and use explained for 3 weeks continually from 1 pack to the next

## 2012-01-13 NOTE — Assessment & Plan Note (Signed)
Judicious use of anti inflammatory as well as reduction in weight bearing exercises

## 2012-03-17 ENCOUNTER — Ambulatory Visit: Payer: 59 | Admitting: Family Medicine

## 2012-05-12 ENCOUNTER — Other Ambulatory Visit: Payer: Self-pay | Admitting: Family Medicine

## 2012-05-12 DIAGNOSIS — Z139 Encounter for screening, unspecified: Secondary | ICD-10-CM

## 2012-05-16 ENCOUNTER — Ambulatory Visit (HOSPITAL_COMMUNITY)
Admission: RE | Admit: 2012-05-16 | Discharge: 2012-05-16 | Disposition: A | Discharge status: Home / Self Care | Payer: 59 | Source: Ambulatory Visit | Attending: Family Medicine | Admitting: Family Medicine

## 2012-05-16 DIAGNOSIS — Z1231 Encounter for screening mammogram for malignant neoplasm of breast: Secondary | ICD-10-CM | POA: Insufficient documentation

## 2012-05-16 DIAGNOSIS — Z139 Encounter for screening, unspecified: Secondary | ICD-10-CM

## 2012-05-17 ENCOUNTER — Other Ambulatory Visit: Payer: Self-pay | Admitting: Family Medicine

## 2012-05-18 LAB — LIPID PANEL
Cholesterol: 160 mg/dL (ref 0–200)
HDL: 57 mg/dL (ref >39)

## 2012-05-18 LAB — COMPLETE METABOLIC PANEL WITH GFR
ALT: 8 U/L (ref 0–35)
AST: 14 U/L (ref 0–37)
Calcium: 9.7 mg/dL (ref 8.4–10.5)
Chloride: 104 mEq/L (ref 96–112)
Creat: 0.9 mg/dL (ref 0.50–1.10)
Sodium: 137 mEq/L (ref 135–145)
Total Protein: 7.2 g/dL (ref 6.0–8.3)

## 2012-05-18 LAB — CBC WITH DIFFERENTIAL/PLATELET
Basophils Absolute: 0 10*3/uL (ref 0.0–0.1)
Eosinophils Relative: 1 % (ref 0–5)
Lymphocytes Relative: 35 % (ref 12–46)
MCV: 89.3 fL (ref 78.0–100.0)
Neutro Abs: 2.9 10*3/uL (ref 1.7–7.7)
Neutrophils Relative %: 55 % (ref 43–77)
Platelets: 199 10*3/uL (ref 150–400)
RDW: 13.7 % (ref 11.5–15.5)
WBC: 5.3 10*3/uL (ref 4.0–10.5)

## 2012-05-18 LAB — TSH: TSH: 1.73 u[IU]/mL (ref 0.350–4.500)

## 2012-05-23 ENCOUNTER — Encounter: Payer: 59 | Admitting: Family Medicine

## 2012-06-02 ENCOUNTER — Telehealth: Payer: Self-pay | Admitting: Family Medicine

## 2012-06-03 NOTE — Telephone Encounter (Signed)
Copy mailed.

## 2012-07-11 ENCOUNTER — Encounter: Payer: 59 | Admitting: Family Medicine

## 2012-07-31 ENCOUNTER — Other Ambulatory Visit: Payer: Self-pay | Admitting: Family Medicine

## 2013-03-13 ENCOUNTER — Other Ambulatory Visit (HOSPITAL_COMMUNITY)
Admission: RE | Admit: 2013-03-13 | Discharge: 2013-03-13 | Disposition: A | Discharge status: Home / Self Care | Payer: 59 | Source: Ambulatory Visit | Attending: Family Medicine | Admitting: Family Medicine

## 2013-03-13 ENCOUNTER — Ambulatory Visit (INDEPENDENT_AMBULATORY_CARE_PROVIDER_SITE_OTHER): Payer: 59 | Admitting: Family Medicine

## 2013-03-13 ENCOUNTER — Encounter: Payer: Self-pay | Admitting: Family Medicine

## 2013-03-13 ENCOUNTER — Telehealth: Payer: Self-pay | Admitting: Family Medicine

## 2013-03-13 VITALS — BP 110/68 | HR 72 | Resp 18 | Ht 64.0 in | Wt 143.0 lb

## 2013-03-13 DIAGNOSIS — Z1211 Encounter for screening for malignant neoplasm of colon: Secondary | ICD-10-CM

## 2013-03-13 DIAGNOSIS — D251 Intramural leiomyoma of uterus: Secondary | ICD-10-CM | POA: Insufficient documentation

## 2013-03-13 DIAGNOSIS — R5381 Other malaise: Secondary | ICD-10-CM

## 2013-03-13 DIAGNOSIS — R87810 Cervical high risk human papillomavirus (HPV) DNA test positive: Secondary | ICD-10-CM | POA: Insufficient documentation

## 2013-03-13 DIAGNOSIS — Z01419 Encounter for gynecological examination (general) (routine) without abnormal findings: Secondary | ICD-10-CM

## 2013-03-13 DIAGNOSIS — E785 Hyperlipidemia, unspecified: Secondary | ICD-10-CM

## 2013-03-13 DIAGNOSIS — R1900 Intra-abdominal and pelvic swelling, mass and lump, unspecified site: Secondary | ICD-10-CM

## 2013-03-13 DIAGNOSIS — Z1321 Encounter for screening for nutritional disorder: Secondary | ICD-10-CM

## 2013-03-13 DIAGNOSIS — Z1151 Encounter for screening for human papillomavirus (HPV): Secondary | ICD-10-CM | POA: Insufficient documentation

## 2013-03-13 DIAGNOSIS — R32 Unspecified urinary incontinence: Secondary | ICD-10-CM

## 2013-03-13 DIAGNOSIS — Z0000 Encounter for general adult medical examination without abnormal findings: Secondary | ICD-10-CM

## 2013-03-13 DIAGNOSIS — Z130 Encounter for screening for diseases of the blood and blood-forming organs and certain disorders involving the immune mechanism: Secondary | ICD-10-CM

## 2013-03-13 DIAGNOSIS — R5383 Other fatigue: Secondary | ICD-10-CM

## 2013-03-13 LAB — POCT URINALYSIS DIPSTICK
Bilirubin, UA: NEGATIVE
Blood, UA: NEGATIVE
Glucose, UA: NEGATIVE
Ketones, UA: NEGATIVE
Leukocytes, UA: NEGATIVE
pH, UA: 7

## 2013-03-13 LAB — POC HEMOCCULT BLD/STL (OFFICE/1-CARD/DIAGNOSTIC): Fecal Occult Blood, POC: NEGATIVE

## 2013-03-13 MED ORDER — SOLIFENACIN SUCCINATE 5 MG PO TABS: 5.0000 mg | ORAL_TABLET | Freq: Every day | ORAL | Status: DC

## 2013-03-13 NOTE — Progress Notes (Signed)
  Subjective:    Patient ID: Cheyenne Harris, female    DOB: 1966/07/10, 47 y.o.   MRN: [id number]  HPI The PT is here for annual exam  and re-evaluation of chronic medical conditions, medication management and review of any available recent lab and radiology data.  Preventive health is updated, specifically  Cancer screening and Immunization.    The PT denies any adverse reactions to current medications since the last visit.  C/o worsening urge incontinence and is interested in medication for symptom control    Review of Systems    See HPI Denies recent fever or chills. Denies sinus pressure, nasal congestion, ear pain or sore throat. Denies chest congestion, productive cough or wheezing. Denies chest pains, palpitations and leg swelling Denies abdominal pain, nausea, vomiting,diarrhea or constipation.    Denies joint pain, swelling and limitation in mobility. Denies headaches, seizures, numbness, or tingling. Denies depression, anxiety or insomnia. Denies skin break down or rash.     Objective:   Physical Exam Pleasant well nourished female, alert and oriented x 3, in no cardio-pulmonary distress. Afebrile. HEENT No facial trauma or asymetry. Sinuses non tender.  EOMI, PERTL, fundoscopic exam is normal, no hemorhage or exudate.  External ears normal, tympanic membranes clear. Oropharynx moist, no exudate, good dentition. Neck: supple, no adenopathy,JVD or thyromegaly.No bruits.  Chest: Clear to ascultation bilaterally.No crackles or wheezes. Non tender to palpation  Breast: No asymetry,no masses. No nipple discharge or inversion. No axillary or supraclavicular adenopathy  Cardiovascular system; Heart sounds normal,  S1 and  S2 ,no S3.  No murmur, or thrill. Apical beat not displaced Peripheral pulses normal.  Abdomen: Soft, non tender, no organomegaly or masses. No bruits. Bowel sounds normal. No guarding, tenderness or rebound.  Rectal:  No mass. Guaiac  negative stool.  GU: External genitalia normal. No lesions. Vaginal canal normal.physiologic  discharge. Uterus enlarged to approx 16 week size, no adnexal masses, no cervical motion or adnexal tenderness.  Musculoskeletal exam: Full ROM of spine, hips , shoulders and knees. No deformity ,swelling or crepitus noted. No muscle wasting or atrophy.   Neurologic: Cranial nerves 2 to 12 intact. Power, tone ,sensation and reflexes normal throughout. No disturbance in gait. No tremor.  Skin: Intact, no ulceration, erythema , scaling or rash noted. Pigmentation normal throughout  Psych; Normal mood and affect. Judgement and concentration normal        Assessment & Plan:

## 2013-03-13 NOTE — Patient Instructions (Addendum)
F/u in 3 month, call if you need me before.  Urine is being tested for infection and you will start medication vesicare, to help with incontinence, if not successful I will refer you to urologist for further evaluation  You are referred for a pelvic US , your womb is enlarged, which may be contributing too the problem with urine  Please do the exercises discussed to strengthen bladder muscles and empty your bladder regularly on a schedule to reduce accidents. If no success you will be referred to urology  Check with your insurance to see if screening colonoscopy is covered, recommended for African American community starting at age 10, call back to let me know if it is , so I can refer you  Fasting CBC.lipid, cmp, and TSH and Vit D end August

## 2013-03-14 NOTE — Telephone Encounter (Signed)
Patient is aware 

## 2013-03-20 ENCOUNTER — Other Ambulatory Visit (HOSPITAL_COMMUNITY): Payer: 59

## 2013-03-20 ENCOUNTER — Ambulatory Visit (HOSPITAL_COMMUNITY): Payer: 59

## 2013-03-20 ENCOUNTER — Ambulatory Visit (HOSPITAL_COMMUNITY)
Admission: RE | Admit: 2013-03-20 | Discharge: 2013-03-20 | Disposition: A | Discharge status: Home / Self Care | Payer: 59 | Source: Ambulatory Visit | Attending: Family Medicine | Admitting: Family Medicine

## 2013-03-20 DIAGNOSIS — R1900 Intra-abdominal and pelvic swelling, mass and lump, unspecified site: Secondary | ICD-10-CM

## 2013-03-20 DIAGNOSIS — D259 Leiomyoma of uterus, unspecified: Secondary | ICD-10-CM | POA: Insufficient documentation

## 2013-03-20 DIAGNOSIS — R1909 Other intra-abdominal and pelvic swelling, mass and lump: Secondary | ICD-10-CM | POA: Insufficient documentation

## 2013-03-25 DIAGNOSIS — Z0000 Encounter for general adult medical examination without abnormal findings: Secondary | ICD-10-CM | POA: Insufficient documentation

## 2013-03-25 NOTE — Assessment & Plan Note (Signed)
Large fibroids, refer for [place] to further eval

## 2013-03-25 NOTE — Assessment & Plan Note (Signed)
Pelvic , breast and rectal exam as documented. Pt encouraged to contiue healthy lifestyle re diet,and exercise

## 2013-03-25 NOTE — Assessment & Plan Note (Signed)
Worsening urge incontinence, likely due to fibroid, UA is negative for infection

## 2013-05-08 ENCOUNTER — Telehealth: Payer: Self-pay | Admitting: Family Medicine

## 2013-05-08 DIAGNOSIS — Z3040 Encounter for surveillance of contraceptives, unspecified: Secondary | ICD-10-CM

## 2013-05-08 MED ORDER — NORETHIN-ETH ESTRAD TRIPHASIC 0.5/0.75/1-35 MG-MCG PO TABS: 1.0000 | ORAL_TABLET | Freq: Every day | ORAL | Status: DC

## 2013-05-08 NOTE — Telephone Encounter (Signed)
rx sent

## 2013-05-23 ENCOUNTER — Other Ambulatory Visit: Payer: Self-pay | Admitting: Family Medicine

## 2013-05-23 DIAGNOSIS — Z139 Encounter for screening, unspecified: Secondary | ICD-10-CM

## 2013-05-29 ENCOUNTER — Ambulatory Visit (HOSPITAL_COMMUNITY)
Admission: RE | Admit: 2013-05-29 | Discharge: 2013-05-29 | Disposition: A | Discharge status: Home / Self Care | Payer: 59 | Source: Ambulatory Visit | Attending: Family Medicine | Admitting: Family Medicine

## 2013-05-29 DIAGNOSIS — Z1231 Encounter for screening mammogram for malignant neoplasm of breast: Secondary | ICD-10-CM | POA: Insufficient documentation

## 2013-05-29 DIAGNOSIS — Z139 Encounter for screening, unspecified: Secondary | ICD-10-CM

## 2013-06-06 ENCOUNTER — Other Ambulatory Visit: Payer: Self-pay | Admitting: Family Medicine

## 2013-06-06 DIAGNOSIS — R928 Other abnormal and inconclusive findings on diagnostic imaging of breast: Secondary | ICD-10-CM

## 2013-06-20 ENCOUNTER — Other Ambulatory Visit: Payer: Self-pay | Admitting: Family Medicine

## 2013-06-20 ENCOUNTER — Ambulatory Visit (INDEPENDENT_AMBULATORY_CARE_PROVIDER_SITE_OTHER): Payer: 59 | Admitting: Family Medicine

## 2013-06-20 VITALS — BP 122/66 | HR 78 | Resp 18 | Ht 64.0 in | Wt 150.0 lb

## 2013-06-20 DIAGNOSIS — E785 Hyperlipidemia, unspecified: Secondary | ICD-10-CM

## 2013-06-20 DIAGNOSIS — J0110 Acute frontal sinusitis, unspecified: Secondary | ICD-10-CM | POA: Insufficient documentation

## 2013-06-20 LAB — LIPID PANEL
HDL: 66 mg/dL (ref >39)
LDL Cholesterol: 98 mg/dL (ref 0–99)

## 2013-06-20 LAB — COMPREHENSIVE METABOLIC PANEL
AST: 14 U/L (ref 0–37)
Albumin: 3.8 g/dL (ref 3.5–5.2)
Alkaline Phosphatase: 41 U/L (ref 39–117)
BUN: 9 mg/dL (ref 6–23)
Calcium: 9.5 mg/dL (ref 8.4–10.5)
Chloride: 104 mEq/L (ref 96–112)
Glucose, Bld: 84 mg/dL (ref 70–99)
Potassium: 4.6 mEq/L (ref 3.5–5.3)
Sodium: 135 mEq/L (ref 135–145)
Total Protein: 7.3 g/dL (ref 6.0–8.3)

## 2013-06-20 LAB — CBC
HCT: 39 % (ref 36.0–46.0)
Hemoglobin: 13 g/dL (ref 12.0–15.0)
MCHC: 33.3 g/dL (ref 30.0–36.0)
WBC: 7.9 10*3/uL (ref 4.0–10.5)

## 2013-06-20 MED ORDER — FLUCONAZOLE 150 MG PO TABS: ORAL_TABLET | ORAL | Status: AC

## 2013-06-20 MED ORDER — LEVOFLOXACIN 750 MG PO TABS: 750.0000 mg | ORAL_TABLET | Freq: Every day | ORAL | Status: AC

## 2013-06-20 NOTE — Patient Instructions (Addendum)
F/u ni mid January, call if you need me before  Today please go directly to lab for bloodwork , we will reprint order form for you  Today, please go to the radiology dept and schedule the further imaging on the right breast that you need  Medication is sent in for frontal sinus infection, levaquin for 5 days. Fill and take fluconazole only if you develop vaginal itch/irritation after antibiotics from fungal infection  Call and return in 2.5 to 3 weeks for flu vaccine , you need this

## 2013-06-20 NOTE — Progress Notes (Signed)
  Subjective:    Patient ID: Cheyenne Harris, female    DOB: Sep 20, 1966, 47 y.o.   MRN: [id number]  HPI 10 day h/o increased frontal pressure with yellow green nasal drainage, chills, no fever, throat irritarted no ear pain or productive cough. Prior to this she has been well, continues to remain active and to maintain a healthy weight   Review of Systems See HPI Denies recent fever or chills. Denies sinus pressure, nasal congestion, ear pain or sore throat. Denies chest congestion, productive cough or wheezing. Denies chest pains, palpitations and leg swelling Denies abdominal pain, nausea, vomiting,diarrhea or constipation.   Denies dysuria, frequency, hesitancy or incontinence. Denies joint pain, swelling and limitation in mobility. Denies headaches, seizures, numbness, or tingling. Denies depression, anxiety or insomnia. Denies skin break down or rash.        Objective:   Physical Exam  Patient alert and oriented and in no cardiopulmonary distress.  HEENT: No facial asymmetry, EOMI, frontal  sinus tenderness,  oropharynx pink and moist.  Neck supple no adenopathy.TM clear  Chest: Clear to auscultation bilaterally.  CVS: S1, S2 no murmurs, no S3.  ABD: Soft non tender. Bowel sounds normal.  Ext: No edema  MS: Adequate ROM spine, shoulders, hips and knees.  Skin: Intact, no ulcerations or rash noted.  Psych: Good eye contact, normal affect. Memory intact not anxious or depressed appearing.  CNS: CN 2-12 intact, power, tone and sensation normal throughout.       Assessment & Plan:

## 2013-06-21 ENCOUNTER — Other Ambulatory Visit: Payer: Self-pay | Admitting: Family Medicine

## 2013-06-21 LAB — TSH: TSH: 3.345 u[IU]/mL (ref 0.350–4.500)

## 2013-06-23 ENCOUNTER — Other Ambulatory Visit: Payer: Self-pay

## 2013-06-23 ENCOUNTER — Telehealth: Payer: Self-pay | Admitting: Family Medicine

## 2013-06-23 MED ORDER — ROSUVASTATIN CALCIUM 20 MG PO TABS: 20.0000 mg | ORAL_TABLET | Freq: Every day | ORAL | Status: DC

## 2013-06-23 NOTE — Telephone Encounter (Signed)
Called patient back and no answer. Left a message that Labs were normal

## 2013-06-25 ENCOUNTER — Encounter: Payer: Self-pay | Admitting: Family Medicine

## 2013-06-25 NOTE — Assessment & Plan Note (Signed)
Hyperlipidemia:Low fat diet discussed and encouraged.  Updated lab past due and will be reviewed as soon as available

## 2013-06-25 NOTE — Assessment & Plan Note (Signed)
Antibiotic course prescribed 

## 2013-07-05 ENCOUNTER — Ambulatory Visit (HOSPITAL_COMMUNITY)
Admission: RE | Admit: 2013-07-05 | Discharge: 2013-07-05 | Disposition: A | Discharge status: Home / Self Care | Payer: 59 | Source: Ambulatory Visit | Attending: Family Medicine | Admitting: Family Medicine

## 2013-07-05 DIAGNOSIS — R928 Other abnormal and inconclusive findings on diagnostic imaging of breast: Secondary | ICD-10-CM | POA: Insufficient documentation

## 2013-08-10 ENCOUNTER — Other Ambulatory Visit: Payer: Self-pay

## 2013-10-26 ENCOUNTER — Ambulatory Visit: Payer: 59 | Admitting: Family Medicine

## 2014-10-09 ENCOUNTER — Telehealth: Payer: Self-pay | Admitting: *Deleted

## 2014-10-09 DIAGNOSIS — N939 Abnormal uterine and vaginal bleeding, unspecified: Secondary | ICD-10-CM

## 2014-10-09 NOTE — Telephone Encounter (Signed)
Please advise 

## 2014-10-09 NOTE — Telephone Encounter (Signed)
Needs OV , by next week, in the interim needs tSH, chem 7 and CBC   How many months has this been going on for? Probvera 5 mg twice daily is prescribed for 5 days, pls send in after you speak with her, this should stop the current bleed, after this needs to start  Birth control pill, ortho tricyclen one daily as new pack  pls send this after you spk with her ensure she understands Will start orhtoJan 10 or after I will refer her for pelvic US after she comes in, most likely bleeding is due to hormonal chnages, which are sometimes aggravated by stress

## 2014-10-09 NOTE — Telephone Encounter (Signed)
Pt called requesting something to stop menstrual cycle, pt said she starts for 2 weeks stops for a week and on again for 2 weeks, pt said she is having to change tampons every 2 hours. Pt is concerned if something is going on, pt requesting medication for it. Please advise [phone]

## 2014-10-10 ENCOUNTER — Other Ambulatory Visit: Payer: Self-pay

## 2014-10-10 MED ORDER — MEDROXYPROGESTERONE ACETATE 5 MG PO TABS: 5.0000 mg | ORAL_TABLET | Freq: Every day | ORAL | Status: DC

## 2014-10-10 MED ORDER — NORGESTIM-ETH ESTRAD TRIPHASIC 0.18/0.215/0.25 MG-25 MCG PO TABS: 1.0000 | ORAL_TABLET | Freq: Every day | ORAL | Status: DC

## 2014-10-10 NOTE — Telephone Encounter (Signed)
Called and left message for patient to return call.  

## 2014-10-10 NOTE — Addendum Note (Signed)
Addended by: Denman George B on: 10/10/2014 10:58 AM   Modules accepted: Orders

## 2014-10-10 NOTE — Telephone Encounter (Signed)
Patient aware and will have labs done prior to 1/12 appt.  Medications sent to pharmacy

## 2014-10-11 ENCOUNTER — Encounter: Payer: Self-pay | Admitting: Family Medicine

## 2014-10-11 LAB — CBC
HCT: 38 % (ref 36.0–46.0)
HEMOGLOBIN: 12.3 g/dL (ref 12.0–15.0)
MCH: 28.2 pg (ref 26.0–34.0)
MCHC: 32.4 g/dL (ref 30.0–36.0)
MCV: 87.2 fL (ref 78.0–100.0)
MPV: 12.1 fL (ref 8.6–12.4)
Platelets: 229 10*3/uL (ref 150–400)
RBC: 4.36 MIL/uL (ref 3.87–5.11)
RDW: 15.3 % (ref 11.5–15.5)
WBC: 6.1 10*3/uL (ref 4.0–10.5)

## 2014-10-11 LAB — COMPREHENSIVE METABOLIC PANEL
ALT: 10 U/L (ref 0–35)
AST: 18 U/L (ref 0–37)
Albumin: 3.9 g/dL (ref 3.5–5.2)
Alkaline Phosphatase: 61 U/L (ref 39–117)
BILIRUBIN TOTAL: 0.5 mg/dL (ref 0.2–1.2)
BUN: 13 mg/dL (ref 6–23)
CO2: 26 meq/L (ref 19–32)
CREATININE: 1.01 mg/dL (ref 0.50–1.10)
Calcium: 9.5 mg/dL (ref 8.4–10.5)
Chloride: 102 mEq/L (ref 96–112)
GLUCOSE: 82 mg/dL (ref 70–99)
Potassium: 4.2 mEq/L (ref 3.5–5.3)
SODIUM: 139 meq/L (ref 135–145)
Total Protein: 6.8 g/dL (ref 6.0–8.3)

## 2014-10-11 LAB — TSH: TSH: 1.619 u[IU]/mL (ref 0.350–4.500)

## 2014-10-16 ENCOUNTER — Ambulatory Visit: Payer: Self-pay | Admitting: Family Medicine

## 2014-10-19 ENCOUNTER — Ambulatory Visit (INDEPENDENT_AMBULATORY_CARE_PROVIDER_SITE_OTHER): Payer: 59 | Admitting: Family Medicine

## 2014-10-19 ENCOUNTER — Encounter: Payer: Self-pay | Admitting: Family Medicine

## 2014-10-19 VITALS — BP 110/74 | HR 82 | Temp 98.7°F | Resp 16 | Ht 64.0 in | Wt 147.0 lb

## 2014-10-19 DIAGNOSIS — D251 Intramural leiomyoma of uterus: Secondary | ICD-10-CM

## 2014-10-19 DIAGNOSIS — N924 Excessive bleeding in the premenopausal period: Secondary | ICD-10-CM

## 2014-10-19 DIAGNOSIS — N920 Excessive and frequent menstruation with regular cycle: Secondary | ICD-10-CM | POA: Insufficient documentation

## 2014-10-19 DIAGNOSIS — J111 Influenza due to unidentified influenza virus with other respiratory manifestations: Secondary | ICD-10-CM

## 2014-10-19 DIAGNOSIS — N921 Excessive and frequent menstruation with irregular cycle: Secondary | ICD-10-CM

## 2014-10-19 DIAGNOSIS — J1189 Influenza due to unidentified influenza virus with other manifestations: Secondary | ICD-10-CM

## 2014-10-19 DIAGNOSIS — E785 Hyperlipidemia, unspecified: Secondary | ICD-10-CM

## 2014-10-19 DIAGNOSIS — N3941 Urge incontinence: Secondary | ICD-10-CM

## 2014-10-19 MED ORDER — OSELTAMIVIR PHOSPHATE 75 MG PO CAPS: 75.0000 mg | ORAL_CAPSULE | Freq: Two times a day (BID) | ORAL | Status: DC

## 2014-10-19 NOTE — Assessment & Plan Note (Signed)
Acute onset of febrile illness with body aches. Tamiflu prescribed, fluids and rest. Pt does not return to work before 10/23/2014. She will call if she needs an extended period out after this

## 2014-10-19 NOTE — Progress Notes (Signed)
   Subjective:    Patient ID: Cheyenne Harris, female    DOB: December 12, 1965, 49 y.o.   MRN: [id number]  HPI 2 day h/o acute onset of chills , fever, generalized body aches and sore throat. Has headache, denies neck stiffness. No nasal drainage or productive cough. Did not take the flu vaccine, no known sick contact, temp up to 102 5 month h/o irregular bleeding , at times with heayy menses, no excessive new stress, has fibroids. She has noted occasional night sweats in the past several months, she was placed on provera and then has recently started OCP, bleeding is less, currently spotting, will refer to gyne for further management Labs were checked, she is not anemic and her thyroid function is normal   Review of Systems See HPI  Denies chest pains, palpitations and leg swelling Denies abdominal pain, nausea, vomiting,diarrhea or constipation.   Denies dysuria, frequency, hesitancy has chronic incontinence, likely due to the her fibroids.  Denies headaches, seizures, numbness, or tingling. Denies depression, anxiety or insomnia. Denies skin break down or rash.        Objective:   Physical Exam BP 110/74 mmHg  Pulse 82  Temp(Src) 98.7 F (37.1 C) (Oral)  Resp 16  Ht 5\' 4"  (1.626 m)  Wt 147 lb (66.679 kg)  BMI 25.22 kg/m2  SpO2 99%  Patient alert and oriented and in no cardiopulmonary distress.Looks ill, chills  HEENT: No facial asymmetry, EOMI,   oropharynx pink and moist.  Neck supple no JVD, no mass. TM clear bilaterally Chest: Clear to auscultation bilaterally.  CVS: S1, S2 no murmurs, no S3.Regular rate.  ABD: Soft non tender.   Ext: No edema  MS: Adequate ROM spine, shoulders, hips and knees.  Skin: Intact, no ulcerations or rash noted.  Psych: Good eye contact, normal affect. Memory intact not anxious or depressed appearing.  CNS: CN 2-12 intact, power,  normal throughout.no focal deficits noted.        Assessment & Plan:  Influenza caused by  unspecified influenza virus Acute onset of febrile illness with body aches. Tamiflu prescribed, fluids and rest. Pt does not return to work before 10/23/2014. She will call if she needs an extended period out after this    Heavy periods 5 month h/o heavy and irregular periods , with possible hot flashes at night. Likely perimenopausal with bleeding complicated by fibroids Pt referred to gyne for further management . Basic labs are normal Five day course of provera has helped to reduce bleeding temoporarily, and she is now spotting on OCP, her fibroids increase the risk of recurrent flooding   Hyperlipidemia LDL goal <100 Hyperlipidemia:Low fat diet discussed and encouraged.  Updated lab needed at/ before next visit. Pt has not taken a statin for over 14 month reportedly   Urinary incontinence Unchnaged, fibroids likely contributing

## 2014-10-19 NOTE — Assessment & Plan Note (Signed)
5 month h/o heavy and irregular periods , with possible hot flashes at night. Likely perimenopausal with bleeding complicated by fibroids Pt referred to gyne for further management . Basic labs are normal Five day course of provera has helped to reduce bleeding temoporarily, and she is now spotting on OCP, her fibroids increase the risk of recurrent flooding

## 2014-10-19 NOTE — Patient Instructions (Signed)
F/u in 4 month, call if you need me before   You have irregular periods due to perienopause, and also fibroids are complicating this  You are referred to Dr Eual Fines are being treated for influenza  Fasting lipid in 4 month  Influenza Influenza ("the flu") is a viral infection of the respiratory tract. It occurs more often in winter months because people spend more time in close contact with one another. Influenza can make you feel very sick. Influenza easily spreads from person to person (contagious). CAUSES  Influenza is caused by a virus that infects the respiratory tract. You can catch the virus by breathing in droplets from an infected person's cough or sneeze. You can also catch the virus by touching something that was recently contaminated with the virus and then touching your mouth, nose, or eyes. RISKS AND COMPLICATIONS You may be at risk for a more severe case of influenza if you smoke cigarettes, have diabetes, have chronic heart disease (such as heart failure) or lung disease (such as asthma), or if you have a weakened immune system. Elderly people and pregnant women are also at risk for more serious infections. The most common problem of influenza is a lung infection (pneumonia). Sometimes, this problem can require emergency medical care and may be life threatening. SIGNS AND SYMPTOMS  Symptoms typically last 4 to 10 days and may include:  Fever.  Chills.  Headache, body aches, and muscle aches.  Sore throat.  Chest discomfort and cough.  Poor appetite.  Weakness or feeling tired.  Dizziness.  Nausea or vomiting. DIAGNOSIS  Diagnosis of influenza is often made based on your history and a physical exam. A nose or throat swab test can be done to confirm the diagnosis. TREATMENT  In mild cases, influenza goes away on its own. Treatment is directed at relieving symptoms. For more severe cases, your health care provider may prescribe antiviral medicines to shorten  the sickness. Antibiotic medicines are not effective because the infection is caused by a virus, not by bacteria. HOME CARE INSTRUCTIONS  Take medicines only as directed by your health care provider.  Use a cool mist humidifier to make breathing easier.  Get plenty of rest until your temperature returns to normal. This usually takes 3 to 4 days.  Drink enough fluid to keep your urine clear or pale yellow.  Cover yourmouth and nosewhen coughing or sneezing,and wash your handswellto prevent thevirusfrom spreading.  Stay homefromwork orschool untilthe fever is gonefor at least 12full day. PREVENTION  An annual influenza vaccination (flu shot) is the best way to avoid getting influenza. An annual flu shot is now routinely recommended for all adults in the [place] IF:  You experiencechest pain, yourcough worsens,or you producemore mucus.  Youhave nausea,vomiting, ordiarrhea.  Your fever returns or gets worse. SEEK IMMEDIATE MEDICAL CARE IF:  You havetrouble breathing, you become short of breath,or your skin ornails becomebluish.  You have severe painor stiffnessin the neck.  You develop a sudden headache, or pain in the face or ear.  You have nausea or vomiting that you cannot control. MAKE SURE YOU:   Understand these instructions.  Will watch your condition.  Will get help right away if you are not doing well or get worse. Document Released: 09/18/2000 Document Revised: 02/05/2014 Document Reviewed: 12/21/2011 [facility] Patient Information 2015 [employer], [place]. This information is not intended to replace advice given to you by your health care provider. Make sure you discuss any questions you  have with your health care provider.  

## 2014-10-19 NOTE — Assessment & Plan Note (Signed)
Hyperlipidemia:Low fat diet discussed and encouraged.  Updated lab needed at/ before next visit. Pt has not taken a statin for over 14 month reportedly

## 2014-10-19 NOTE — Assessment & Plan Note (Signed)
Unchnaged, fibroids likely contributing

## 2014-10-30 ENCOUNTER — Telehealth: Payer: Self-pay | Admitting: Obstetrics and Gynecology

## 2014-10-30 NOTE — Telephone Encounter (Signed)
Call made to patient who is concerned over bleeding, and has called dr Griffin Dakin office for evaluation.  I left a message for pt to call our office in the morning, and we'll make appt this week

## 2014-10-30 NOTE — Telephone Encounter (Signed)
Thanks for your follow up

## 2014-11-07 ENCOUNTER — Encounter: Payer: Self-pay | Admitting: Obstetrics and Gynecology

## 2014-11-07 ENCOUNTER — Ambulatory Visit (INDEPENDENT_AMBULATORY_CARE_PROVIDER_SITE_OTHER): Payer: 59 | Admitting: Obstetrics and Gynecology

## 2014-11-07 VITALS — BP 140/100 | Ht 64.0 in | Wt 151.0 lb

## 2014-11-07 DIAGNOSIS — D259 Leiomyoma of uterus, unspecified: Secondary | ICD-10-CM

## 2014-11-07 MED ORDER — MEGESTROL ACETATE 40 MG PO TABS: 40.0000 mg | ORAL_TABLET | Freq: Three times a day (TID) | ORAL | Status: DC

## 2014-11-07 NOTE — Progress Notes (Signed)
Patient ID: [name], female   DOB: [birth year], 49 y.o.   MRN: [id number] Pt here today for prolonged period. Pt states that she has been bleeding since November 27th and has maybe stopped for about 2 weeks since then. Pt states that she has had cramping and some pain.    [facility] Visit  Patient name: Cheyenne Harris MRN [id number]  Date of birth: 05/30/1966  CC & HPI:  Cheyenne Harris is a 49 y.o. female presenting today for prolonged period. Bleeding since the end of November. Bleeding heavy with clots. Has had to change tampons every 2 hours. Taking OCP's now with no change. Since 10/14/14.   ROS:  Seen by Dr Cheyenne Harris, 1/10 and placed on ocp, no change in bleeding. + incr fatigue  Pertinent History Reviewed:   Reviewed: Significant for last pap 2 yr ago Medical         Past Medical History  Diagnosis Date  . Hyperlipidemia   . Hypertension                               Surgical Hx:   History reviewed. No pertinent past surgical history. Medications: Reviewed & Updated - see associated section                       Current outpatient prescriptions:  .  KRILL OIL PO, Take 1 capsule by mouth daily., Disp: , Rfl:  .  loratadine (CLARITIN) 10 MG tablet, Take 10 mg by mouth daily., Disp: , Rfl:  .  Multiple Vitamins-Minerals (MULTIVITAMIN WITH MINERALS) tablet, Take 1 tablet by mouth daily., Disp: , Rfl:  .  Norgestimate-Ethinyl Estradiol Triphasic (ORTHO TRI-CYCLEN LO) 0.18/0.215/0.25 MG-25 MCG tab, Take 1 tablet by mouth daily., Disp: 1 Package, Rfl: 4 .  psyllium (METAMUCIL) 58.6 % powder, Take 1 packet by mouth as needed.  , Disp: , Rfl:    Social History: Reviewed -  reports that she has never smoked. She has never used smokeless tobacco.  Objective Findings:  Vitals: Blood pressure 140/100, height 5\' 4"  (1.626 m), weight 151 lb (68.493 kg), last menstrual period 08/31/2014.  Physical Examination: General appearance - alert, well appearing, and in no distress,  oriented to person, place, and time and dehydrated Mental status - alert, oriented to person, place, and time, normal mood, behavior, speech, dress, motor activity, and thought processes Abdomen - soft, nontender, nondistended, no masses or organomegaly Pelvic - VULVA: normal appearing vulva with no masses, tenderness or lesions, VAGINA: normal appearing vagina with normal color and discharge, no lesions, HEAVY FLOW, 10 CC OF WATERY BLOOD IN VAULT ,NO CLOTS  PELVIC FLOOR EXAM: no cystocele, rectocele or prolapse noted, bulbous cervix well supported, CERVIX: normal appearing cervix without discharge or lesions, no discharge noted, multiparous os, UTERUS: enlarged to 16-18 week's size, est weight 800 gm , ADNEXA: normal adnexa in size, nontender and no masses, exam limited by ut enlargement, exam chaperoned by AMT   Assessment & Plan:   A:   1. Fibroid uterus enlargement 2  Heavy and prolonged menses(AUB) 3  Needs pap PPlan: 1. Pelvic u/s              2. Megace 40 tid             3. Endometrial biopsy             4 consider lupron supression  5.     Anemia panel

## 2014-11-08 ENCOUNTER — Telehealth: Payer: Self-pay | Admitting: Obstetrics and Gynecology

## 2014-11-08 LAB — IRON AND TIBC
IRON: 68 ug/dL (ref 27–159)
Iron Saturation: 16 % (ref 15–55)
TIBC: 435 ug/dL (ref 250–450)
UIBC: 367 ug/dL (ref 131–425)

## 2014-11-08 LAB — CBC WITH DIFFERENTIAL/PLATELET
Basophils Absolute: 0 10*3/uL (ref 0.0–0.2)
Basos: 1 %
EOS ABS: 0.1 10*3/uL (ref 0.0–0.4)
EOS: 1 %
HEMATOCRIT: 36.8 % (ref 34.0–46.6)
Hemoglobin: 12.4 g/dL (ref 11.1–15.9)
IMMATURE GRANULOCYTES: 0 %
Immature Grans (Abs): 0 10*3/uL (ref 0.0–0.1)
LYMPHS ABS: 1.8 10*3/uL (ref 0.7–3.1)
Lymphs: 37 %
MCH: 29 pg (ref 26.6–33.0)
MCHC: 33.7 g/dL (ref 31.5–35.7)
MCV: 86 fL (ref 79–97)
MONOCYTES: 7 %
MONOS ABS: 0.3 10*3/uL (ref 0.1–0.9)
NEUTROS PCT: 54 %
Neutrophils Absolute: 2.6 10*3/uL (ref 1.4–7.0)
PLATELETS: 336 10*3/uL (ref 150–379)
RBC: 4.27 x10E6/uL (ref 3.77–5.28)
RDW: 14.3 % (ref 12.3–15.4)
WBC: 4.9 10*3/uL (ref 3.4–10.8)

## 2014-11-08 LAB — FOLATE: FOLATE: 18.7 ng/mL (ref >3.0)

## 2014-11-08 LAB — VITAMIN B12: Vitamin B-12: 1295 pg/mL — ABNORMAL HIGH (ref 211–946)

## 2014-11-08 LAB — FERRITIN: FERRITIN: 13 ng/mL — AB (ref 15–150)

## 2014-11-16 ENCOUNTER — Other Ambulatory Visit: Payer: 59 | Admitting: Obstetrics and Gynecology

## 2014-11-16 ENCOUNTER — Other Ambulatory Visit: Payer: Self-pay | Admitting: Obstetrics and Gynecology

## 2014-11-16 ENCOUNTER — Ambulatory Visit (INDEPENDENT_AMBULATORY_CARE_PROVIDER_SITE_OTHER): Payer: 59

## 2014-11-16 DIAGNOSIS — N852 Hypertrophy of uterus: Secondary | ICD-10-CM

## 2014-11-16 DIAGNOSIS — D259 Leiomyoma of uterus, unspecified: Secondary | ICD-10-CM

## 2014-11-16 DIAGNOSIS — N939 Abnormal uterine and vaginal bleeding, unspecified: Secondary | ICD-10-CM

## 2014-11-19 ENCOUNTER — Other Ambulatory Visit: Payer: 59 | Admitting: Obstetrics and Gynecology

## 2014-11-28 ENCOUNTER — Encounter: Payer: Self-pay | Admitting: Obstetrics and Gynecology

## 2014-11-28 ENCOUNTER — Other Ambulatory Visit (HOSPITAL_COMMUNITY)
Admission: RE | Admit: 2014-11-28 | Discharge: 2014-11-28 | Disposition: A | Discharge status: Home / Self Care | Payer: 59 | Source: Ambulatory Visit | Attending: Obstetrics and Gynecology | Admitting: Obstetrics and Gynecology

## 2014-11-28 ENCOUNTER — Ambulatory Visit (INDEPENDENT_AMBULATORY_CARE_PROVIDER_SITE_OTHER): Payer: 59 | Admitting: Obstetrics and Gynecology

## 2014-11-28 VITALS — BP 128/80 | Ht 64.0 in | Wt 153.0 lb

## 2014-11-28 DIAGNOSIS — Z01419 Encounter for gynecological examination (general) (routine) without abnormal findings: Secondary | ICD-10-CM

## 2014-11-28 DIAGNOSIS — Z1151 Encounter for screening for human papillomavirus (HPV): Secondary | ICD-10-CM | POA: Diagnosis present

## 2014-11-28 DIAGNOSIS — Z1212 Encounter for screening for malignant neoplasm of rectum: Secondary | ICD-10-CM

## 2014-11-28 LAB — HEMOCCULT GUIAC POC 1CARD (OFFICE): Fecal Occult Blood, POC: NEGATIVE

## 2014-11-28 MED ORDER — LEUPROLIDE ACETATE 3.75 MG IM KIT: 11.2500 mg | PACK | Freq: Once | INTRAMUSCULAR | Status: DC

## 2014-11-28 NOTE — Progress Notes (Signed)
Patient ID: Cheyenne Harris, female   DOB: 25-Nov-1965, 49 y.o.   MRN: [id number]  Assessment:  Annual Gyn Exam  stable fibroid uterus 16 wk size pt desires to proceed toward removal Will suppress with lupron Plan:  1. pap smear done, next pap due 3 yr 2. return annually or prn 3    Annual mammogram advised Subjective:  Cheyenne Harris is a 49 y.o. female No obstetric history on file. who presents for annual exam. Patient's last menstrual period was 08/31/2014. The patient has complaints today of pressure ffrom fibriods. Pt works out regularly. Has high cholesterol above 240's  The following portions of the patient's history were reviewed and updated as appropriate: allergies, current medications, past family history, past medical history, past social history, past surgical history and problem list. Past Medical History  Diagnosis Date  . Hyperlipidemia   . Hypertension   Dr Moshe Cipro  History reviewed. No pertinent past surgical history.   Current outpatient prescriptions:  .  KRILL OIL PO, Take 1 capsule by mouth daily., Disp: , Rfl:  .  loratadine (CLARITIN) 10 MG tablet, Take 10 mg by mouth daily., Disp: , Rfl:  .  megestrol (MEGACE) 40 MG tablet, Take 1 tablet (40 mg total) by mouth 3 (three) times daily., Disp: 45 tablet, Rfl: 2 .  Multiple Vitamins-Minerals (MULTIVITAMIN WITH MINERALS) tablet, Take 1 tablet by mouth daily., Disp: , Rfl:  .  psyllium (METAMUCIL) 58.6 % powder, Take 1 packet by mouth as needed.  , Disp: , Rfl:  .  Norgestimate-Ethinyl Estradiol Triphasic (ORTHO TRI-CYCLEN LO) 0.18/0.215/0.25 MG-25 MCG tab, Take 1 tablet by mouth daily. (Patient not taking: Reported on 11/28/2014), Disp: 1 Package, Rfl: 4  Review of Systems Constitutional: negative Gastrointestinal: suprapubic pressure Genitourinary: nocturia rarely, days q< 2hr in days  Objective:  BP 128/80 mmHg  Ht 5\' 4"  (1.626 m)  Wt 153 lb (69.4 kg)  BMI 26.25 kg/m2  LMP 08/31/2014   BMI: Body mass index  is 26.25 kg/(m^2).  General Appearance: Alert, appropriate appearance for age. No acute distress HEENT: Grossly normal Neck / Thyroid:  Cardiovascular: RRR; normal S1, S2, no murmur Lungs: CTA bilaterally Back: No CVAT Breast Exam: No dimpling, nipple retraction or discharge. No masses or nodes. and No masses or nodes.No dimpling, nipple retraction or discharge. Gastrointestinal: Soft, non-tender, no masses or organomegaly Pelvic Exam: Vulva and vagina appear normal. Bimanual exam reveals normal uterus and adnexa. External genitalia: normal general appearance Vaginal: normal mucosa without prolapse or lesions Cervix: normal appearance and small Uterus: irregular enlargement and 16 wk Rectovaginal: normal rectal, no masses and guaiac negative stool obtained Lymphatic Exam: Non-palpable nodes in neck, clavicular, axillary, or inguinal regions Skin: no rash or abnormalities Neurologic: Normal gait and speech, no tremor  Psychiatric: Alert and oriented, appropriate affect.  Urinalysis:Not done GYNECOLOGIC SONOGRAM   Cheyenne Harris is a 49 y.o. for a pelvic sonogram for AUB, enlarged uterus h/o fibroids  Uterus 15.2 x 10.9 x 10.5 cm, multiple fibroids noted,   Fibroids- #1- 4.5 x 3.2 cm  #2-7.5 x 5.4 cm  #3-6.4 x 4.9 cm  #4-3.3 x 1.6 cm  Endometrium 6.7 mm distorted by fibroids  Right ovary 3.7 x 2.6 x 1.8 cm,   Left ovary 3.6 x 2.0 x 2.0 cm,   No free fluid or adnexal masses noted within the pelvis  Technician Comments:  Anteverted uterus, fibroids noted, Endometrium distorted by fibroids, bilateral adnexa/ovaries appear WNL   Lazarus Gowda 11/16/2014 10:51 AM  Clinical Impression and recommendations:  I have reviewed the sonogram results above.  Combined with the patient's current clinical course, below are my impressions and any appropriate recommendations for management  based on the sonographic findings:  1. Enlarged uterus, estimated weigh 920+ gram uterine size. 2 Normal endometrium for premenopausal female 3. Norma adnexal structures  Wendall Mola. MD Pgr [phone] 2:58 PM

## 2014-12-03 LAB — CYTOLOGY - PAP

## 2014-12-05 ENCOUNTER — Telehealth: Payer: Self-pay | Admitting: Obstetrics and Gynecology

## 2014-12-05 NOTE — Telephone Encounter (Signed)
Pt aware that prior authorization has been sent to her insurance and should be back in 24- 72 hours. Pt was advised that as soon as I knew something I would call her back. Pt verbalized understanding.

## 2014-12-06 NOTE — Telephone Encounter (Signed)
Pt aware that Rx has been approved.

## 2014-12-12 ENCOUNTER — Encounter: Payer: Self-pay | Admitting: *Deleted

## 2014-12-12 ENCOUNTER — Ambulatory Visit (INDEPENDENT_AMBULATORY_CARE_PROVIDER_SITE_OTHER): Payer: 59 | Admitting: *Deleted

## 2014-12-12 DIAGNOSIS — D259 Leiomyoma of uterus, unspecified: Secondary | ICD-10-CM

## 2014-12-12 DIAGNOSIS — Z3202 Encounter for pregnancy test, result negative: Secondary | ICD-10-CM | POA: Diagnosis not present

## 2014-12-12 DIAGNOSIS — D251 Intramural leiomyoma of uterus: Secondary | ICD-10-CM

## 2014-12-12 DIAGNOSIS — Z3200 Encounter for pregnancy test, result unknown: Secondary | ICD-10-CM

## 2014-12-12 LAB — POCT URINE PREGNANCY: Preg Test, Ur: NEGATIVE

## 2014-12-12 MED ORDER — LEUPROLIDE ACETATE (3 MONTH) 11.25 MG IM KIT
11.2500 mg | PACK | Freq: Once | INTRAMUSCULAR | Status: AC
  Administered 2014-12-12: 11.25 mg via INTRAMUSCULAR

## 2014-12-12 NOTE — Progress Notes (Signed)
Patient ID: Cheyenne Harris, female   DOB: 1966/08/16, 49 y.o.   MRN: [id number] Pt here today for First Lupron injection. Pt given injection in right deltoid and tolerated well.

## 2015-02-06 ENCOUNTER — Encounter: Payer: Self-pay | Admitting: Obstetrics and Gynecology

## 2015-02-06 ENCOUNTER — Ambulatory Visit (INDEPENDENT_AMBULATORY_CARE_PROVIDER_SITE_OTHER): Payer: 59 | Admitting: Obstetrics and Gynecology

## 2015-02-06 VITALS — BP 120/80 | Ht 64.0 in | Wt 150.0 lb

## 2015-02-06 DIAGNOSIS — D251 Intramural leiomyoma of uterus: Secondary | ICD-10-CM | POA: Diagnosis not present

## 2015-02-06 NOTE — Progress Notes (Signed)
Patient ID: Cheyenne Harris, female   DOB: 1966-03-20, 49 y.o.   MRN: [id number]   [facility] Visit  Patient name: Cheyenne Harris MRN [id number]  Date of birth: 1966/03/13  CC & HPI:  Cheyenne Harris is a 49 y.o. female presenting today for follow-up on her Lupron injection, last given about 12/12/14, per medical records. Patient continues to work out regularly. Patient does reports a history of abnormal pap smears, although they were not precancerous.  ROS:  A complete 10 system review of systems was obtained and all systems are negative except as noted in the HPI and PMH.    Pertinent History Reviewed:   Reviewed: Significant for n/a Medical         Past Medical History  Diagnosis Date  . Hyperlipidemia   . Hypertension                               Surgical Hx:   History reviewed. No pertinent past surgical history. Medications: Reviewed & Updated - see associated section                       Current outpatient prescriptions:  .  KRILL OIL PO, Take 1 capsule by mouth daily., Disp: , Rfl:  .  leuprolide (LUPRON) 3.75 MG injection, Inject 11.25 mg into the muscle once., Disp: 1 kit, Rfl: 1 .  loratadine (CLARITIN) 10 MG tablet, Take 10 mg by mouth daily., Disp: , Rfl:  .  megestrol (MEGACE) 40 MG tablet, Take 1 tablet (40 mg total) by mouth 3 (three) times daily., Disp: 45 tablet, Rfl: 2 .  Multiple Vitamins-Minerals (MULTIVITAMIN WITH MINERALS) tablet, Take 1 tablet by mouth daily., Disp: , Rfl:  .  Norgestimate-Ethinyl Estradiol Triphasic (ORTHO TRI-CYCLEN LO) 0.18/0.215/0.25 MG-25 MCG tab, Take 1 tablet by mouth daily. (Patient not taking: Reported on 11/28/2014), Disp: 1 Package, Rfl: 4 .  psyllium (METAMUCIL) 58.6 % powder, Take 1 packet by mouth as needed.  , Disp: , Rfl:    Social History: Reviewed -  reports that she has never smoked. She has never used smokeless tobacco.  Objective Findings:  Vitals: Blood pressure 120/80, height _0  (1.626 m), weight 150 lb  (68.04 kg).  Physical Examination: General appearance - alert, well appearing, and in no distress, oriented to person, place, and time and normal appearing weight Mental status - alert, oriented to person, place, and time, normal mood, behavior, speech, dress, motor activity, and thought processes, affect appropriate to mood Pelvic - normal external genitalia, good support not easy vag hyst. UTERUS: uterus has significantly decreased in size, now 300 gm est size  Assessment & Plan:   A:  1. Uterus has significantly decreased in size. Fibroid uterus 10-12 wk size  P:   1.  Described various surgical options with patient, including laparoscopy and cervix removal. Patient decides on abdominal supracervical hysterectomy with bilateral salpingectomy.  2. Will schedule surgery after Memorial Day.(June 7?)    This chart was SCRIBED for Mallory Shirk, MD by Stephania Fragmin, ED Scribe. This patient was seen in room 1 and the patient's care was started at 3:34 PM.  I personally performed the services described in this documentation, which was SCRIBED in my presence. The recorded information has been reviewed and considered accurate. It has been edited as necessary during review. Jonnie Kind, MD

## 2015-02-06 NOTE — Patient Instructions (Signed)
Supracervical Hysterectomy A supracervical hysterectomy is surgery to remove the top part of the uterus, but not the cervix. You will no longer have menstrual periods or be able to get pregnant after this surgery. The fallopian tubes and ovaries may also be removed (bilateral salpingo-oophorectomy) during this surgery. This surgery is usually performed using a minimally invasive technique called laparoscopy. This technique allows the surgery to be done through small incisions. The minimally invasive technique provides benefits such as less pain, less risk of infection, and shorter recovery time. LET [facility] CARE PROVIDER KNOW ABOUT:  Any allergies you have.  All medicines you are taking, including vitamins, herbs, eye drops, creams, and over-the-counter medicines.  Previous problems you or members of your family have had with the use of anesthetics.  Any blood disorders you have.  Previous surgeries you have had.  Medical conditions you have. RISKS AND COMPLICATIONS  Generally, this is a safe procedure. However, as with any procedure, complications can occur. Possible complications include:  Bleeding.  Blood clots in the legs or lung.  Infection.  Injury to surrounding organs.  Problems related to anesthesia.  Conversion to an open abdominal surgery.  Additional surgery later to remove the cervix if you have problems with the cervix. BEFORE THE PROCEDURE  Ask your health care provider about changing or stopping your regular medicines.  Do not take aspirin or blood thinners (anticoagulants) for 1 week before the surgery, or as directed by your health care provider.  Do not eat or drink anything for 8 hours before the surgery, or as directed by your health care provider.  Quit smoking if you smoke.  Arrange for a ride home after surgery and for someone to help you at home during recovery. PROCEDURE   You will be given an antibiotic medicine.  An IV tube will be placed  in one of your veins. You will be given medicine to make you sleep (general anesthetic).  A gas (carbon dioxide) will be used to inflate your abdomen. This will allow your surgeon to look inside your abdomen, perform your surgery, and treat any other problems found if necessary.  Three or four small incisions will be made in your abdomen. One of these incisions will be made in the area of your belly button (navel). A thin, flexible tube with a tiny camera and light on the end of it (laparoscope) will be inserted into the incision. The camera on the laparoscope sends a picture to a TV screen in the operating room. This gives your surgeon a good view inside the abdomen.  Other surgical instruments will be inserted through the other incisions.  The uterus will be cut into small pieces and removed through the small incisions.  Your incisions will be closed. AFTER THE PROCEDURE   You will be taken to a recovery area where your progress will be monitored until you are awake, stable, and taking fluids well. If there are no other problems, you will then be moved to a regular hospital room, or you will be allowed to go home.  You will likely have minimal discomfort after the surgery because the incisions are so small with the laparoscopic technique.  You will be given pain medicine while you are in the hospital and for when you go home.  If a bilateral salpingo-oophorectomy was performed before menopause, you will go through a sudden (abrupt) menopause. This can be helped with hormone medicines. Document Released: 03/09/2008 Document Revised: 07/12/2013 Document Reviewed: 03/24/2013 [employer] Patient Information  2015 [employer]. This information is not intended to replace advice given to you by your health care provider. Make sure you discuss any questions you have with your health care provider.  

## 2015-02-06 NOTE — Progress Notes (Signed)
Patient ID: Cheyenne Harris, female   DOB: 01/31/1966, 49 y.o.   MRN: [id number] Pt here today for follow up visit. Pt has had one Lupron injection and is here to follow up to see if she needs another.

## 2015-02-17 LAB — LIPID PANEL
CHOLESTEROL: 232 mg/dL — AB (ref 0–200)
HDL: 65 mg/dL (ref >=46)
LDL CALC: 156 mg/dL — AB (ref 0–99)
TRIGLYCERIDES: 53 mg/dL (ref <150)
Total CHOL/HDL Ratio: 3.6 Ratio
VLDL: 11 mg/dL (ref 0–40)

## 2015-02-21 ENCOUNTER — Ambulatory Visit (INDEPENDENT_AMBULATORY_CARE_PROVIDER_SITE_OTHER): Payer: 59 | Admitting: Family Medicine

## 2015-02-21 VITALS — BP 118/68 | HR 60 | Resp 18 | Ht 64.0 in | Wt 152.0 lb

## 2015-02-21 DIAGNOSIS — K5909 Other constipation: Secondary | ICD-10-CM | POA: Diagnosis not present

## 2015-02-21 DIAGNOSIS — Z23 Encounter for immunization: Secondary | ICD-10-CM | POA: Diagnosis not present

## 2015-02-21 DIAGNOSIS — E785 Hyperlipidemia, unspecified: Secondary | ICD-10-CM | POA: Diagnosis not present

## 2015-02-21 DIAGNOSIS — Z1239 Encounter for other screening for malignant neoplasm of breast: Secondary | ICD-10-CM | POA: Diagnosis not present

## 2015-02-21 DIAGNOSIS — N924 Excessive bleeding in the premenopausal period: Secondary | ICD-10-CM

## 2015-02-21 MED ORDER — ROSUVASTATIN CALCIUM 5 MG PO TABS: 5.0000 mg | ORAL_TABLET | Freq: Every day | ORAL | Status: DC

## 2015-02-21 NOTE — Patient Instructions (Addendum)
F/u in 5 month, call if you need me before  New for cholesterol is crestor 5 mg one at bedtime  Please refuce fried and fatty foods  Fasting lipid and cmp in 5 monhts, 1 week before appt please  TdAP today   All the best with upcoming surgery  Thanks for choosing [facility], we consider it a privelige to serve you.

## 2015-02-21 NOTE — Progress Notes (Signed)
   Subjective:    Patient ID: Cheyenne Harris, female    DOB: 01-Mar-1966, 49 y.o.   MRN: [id number]  HPI The PT is here for follow up and re-evaluation of chronic medical conditions, medication management and review of any available recent lab and radiology data.  Preventive health is updated, specifically  Cancer screening and Immunization.   Questions or concerns regarding consultations or procedures which the PT has had in the interim are  addressed. The PT denies any adverse reactions to current medications since the last visit.  There are no new concerns.  There are no specific complaints       Review of Systems See HPI Denies recent fever or chills. Denies sinus pressure, nasal congestion, ear pain or sore throat. Denies chest congestion, productive cough or wheezing. Denies chest pains, palpitations and leg swelling Denies abdominal pain, nausea, vomiting,diarrhea or constipation.   Denies dysuria, frequency, hesitancy or incontinence. Denies joint pain, swelling and limitation in mobility. Denies headaches, seizures, numbness, or tingling. Denies depression, anxiety or insomnia. Denies skin break down or rash.        Objective:   Physical Exam BP 118/68 mmHg  Pulse 60  Resp 18  Ht 5\' 4"  (1.626 m)  Wt 152 lb 0.6 oz (68.965 kg)  BMI 26.08 kg/m2  SpO2 100% Patient alert and oriented and in no cardiopulmonary distress.  HEENT: No facial asymmetry, EOMI,   oropharynx pink and moist.  Neck supple no JVD, no mass.  Chest: Clear to auscultation bilaterally.  CVS: S1, S2 no murmurs, no S3.Regular rate.  ABD: Soft non tender.   Ext: No edema  MS: Adequate ROM spine, shoulders, hips and knees.  Skin: Intact, no ulcerations or rash noted.  Psych: Good eye contact, normal affect. Memory intact not anxious or depressed appearing.  CNS: CN 2-12 intact, power,  normal throughout.no focal deficits noted.         Assessment & Plan:  Hyperlipidemia LDL goal  <100 Deteriorated and uncontrolled off of medication, resume statin therapy Hyperlipidemia:Low fat diet discussed and encouraged.   Lipid Panel  Lab Results  Component Value Date   CHOL 232* 02/16/2015   HDL 65 02/16/2015   LDLCALC 156* 02/16/2015   TRIG 53 02/16/2015   CHOLHDL 3.6 02/16/2015      Updated lab needed at/ before next visit.    Heavy periods Planned hysterectomy and  Salpingectomy by Dr Glo Herring in the near future   CONSTIPATION, CHRONIC Fibroids likely contributing , daily fiber supplement benmeficial, continue same

## 2015-02-24 ENCOUNTER — Encounter: Payer: Self-pay | Admitting: Family Medicine

## 2015-02-24 NOTE — Assessment & Plan Note (Signed)
Deteriorated and uncontrolled off of medication, resume statin therapy Hyperlipidemia:Low fat diet discussed and encouraged.   Lipid Panel  Lab Results  Component Value Date   CHOL 232* 02/16/2015   HDL 65 02/16/2015   LDLCALC 156* 02/16/2015   TRIG 53 02/16/2015   CHOLHDL 3.6 02/16/2015      Updated lab needed at/ before next visit.

## 2015-02-24 NOTE — Assessment & Plan Note (Signed)
Planned hysterectomy and  Salpingectomy by Dr Glo Herring in the near future

## 2015-02-24 NOTE — Assessment & Plan Note (Signed)
Fibroids likely contributing , daily fiber supplement benmeficial, continue same

## 2015-02-25 ENCOUNTER — Other Ambulatory Visit: Payer: Self-pay | Admitting: Family Medicine

## 2015-02-25 DIAGNOSIS — Z1231 Encounter for screening mammogram for malignant neoplasm of breast: Secondary | ICD-10-CM

## 2015-03-06 ENCOUNTER — Encounter: Payer: Self-pay | Admitting: Obstetrics and Gynecology

## 2015-03-06 ENCOUNTER — Ambulatory Visit (INDEPENDENT_AMBULATORY_CARE_PROVIDER_SITE_OTHER): Payer: 59 | Admitting: Obstetrics and Gynecology

## 2015-03-06 VITALS — BP 138/84 | HR 64 | Ht 64.0 in | Wt 151.8 lb

## 2015-03-06 DIAGNOSIS — D251 Intramural leiomyoma of uterus: Secondary | ICD-10-CM

## 2015-03-06 NOTE — Progress Notes (Signed)
Patient ID: Cheyenne Harris, female   DOB: 1965-11-01, 49 y.o.   MRN: [id number]  Preoperative History and Physical  Cheyenne Harris is a 49 y.o. No obstetric history on file. here for surgical management of symptomatic uterine fibroids.   No significant preoperative concerns. She denies any symptoms of URI at this time.  Proposed surgery: open supracervical hysterectomy and bilateral salpingectomy.   Past Medical History  Diagnosis Date  . Hyperlipidemia   . Hypertension    History reviewed. No pertinent past surgical history. OB History  No data available  Patient denies any other pertinent gynecologic issues.   Current Outpatient Prescriptions on File Prior to Visit  Medication Sig Dispense Refill  . KRILL OIL PO Take 1 capsule by mouth daily.    Marland Kitchen loratadine (CLARITIN) 10 MG tablet Take 10 mg by mouth daily.    . Multiple Vitamins-Minerals (MULTIVITAMIN WITH MINERALS) tablet Take 1 tablet by mouth daily.    . psyllium (METAMUCIL) 58.6 % powder Take 1 packet by mouth as needed.      . rosuvastatin (CRESTOR) 5 MG tablet Take 1 tablet (5 mg total) by mouth daily. 90 tablet 1  . leuprolide (LUPRON) 3.75 MG injection Inject 11.25 mg into the muscle once. (Patient not taking: Reported on 02/28/2015) 1 kit 1  . megestrol (MEGACE) 40 MG tablet Take 1 tablet (40 mg total) by mouth 3 (three) times daily. (Patient not taking: Reported on 03/06/2015) 45 tablet 2  . Norgestimate-Ethinyl Estradiol Triphasic (ORTHO TRI-CYCLEN LO) 0.18/0.215/0.25 MG-25 MCG tab Take 1 tablet by mouth daily. (Patient not taking: Reported on 02/28/2015) 1 Package 4   No current facility-administered medications on file prior to visit.   Allergies  Allergen Reactions  . Sulfa Antibiotics Hives    Social History:   reports that she has never smoked. She has never used smokeless tobacco. She reports that she drinks alcohol. She reports that she does not use illicit drugs.  Family History  Problem Relation Age of Onset   . Hypertension Mother   . Hypertension Sister   . Hypertension Brother   . Diabetes Maternal Grandmother   . Heart disease Maternal Grandmother     Review of Systems: Noncontributory  PHYSICAL EXAM: Blood pressure 138/84, pulse 64, height $RemoveBe'5\' 4"'rhmQqHRdN$  (1.626 m), weight 151 lb 12.8 oz (68.856 kg). General appearance - alert, well appearing, and in no distress Chest - clear to auscultation, no wheezes, rales or rhonchi, symmetric air entry Heart - normal rate and regular rhythm Abdomen - soft, nontender, nondistended, no masses or organomegaly Pelvic - examination not indicated se notes from last exam , 12 wk sized uterus. Extremities - peripheral pulses normal, no pedal edema, no clubbing or cyanosis  Labs: No results found for this or any previous visit (from the past 336 hour(s)).  Imaging Studies: No results found.  Assessment: Patient Active Problem List   Diagnosis Date Noted  . Heavy periods 10/19/2014  . Routine general medical examination at a health care facility 03/25/2013  . Fibroids, intramural 03/13/2013  . Urinary incontinence 03/13/2013  . CONSTIPATION, CHRONIC 02/27/2010  . Hyperlipidemia LDL goal <100 01/11/2008    Plan: Patient will undergo surgical management with open supracervical hysterectomy and bilateral salpingectomy.  F/u in 2 weeks for first post-op visit.    Jonnie Kind, MD   03/06/2015 4:34 PM    This chart was SCRIBED for Cheyenne Shirk, MD by Stephania Fragmin, ED Scribe. This patient was seen in room 1 and the patient's  care was started at 4:36 PM.  I personally performed the services described in this documentation, which was SCRIBED in my presence. The recorded information has been reviewed and considered accurate. It has been edited as necessary during review. Jonnie Kind, MD

## 2015-03-07 ENCOUNTER — Other Ambulatory Visit: Payer: Self-pay | Admitting: Obstetrics and Gynecology

## 2015-03-07 ENCOUNTER — Encounter (HOSPITAL_COMMUNITY): Payer: Self-pay

## 2015-03-07 ENCOUNTER — Encounter (HOSPITAL_COMMUNITY)
Admission: RE | Admit: 2015-03-07 | Discharge: 2015-03-07 | Disposition: A | Discharge status: Home / Self Care | Payer: 59 | Source: Ambulatory Visit | Attending: Obstetrics and Gynecology | Admitting: Obstetrics and Gynecology

## 2015-03-07 ENCOUNTER — Other Ambulatory Visit (HOSPITAL_COMMUNITY): Payer: 59

## 2015-03-07 DIAGNOSIS — Z01818 Encounter for other preprocedural examination: Secondary | ICD-10-CM | POA: Insufficient documentation

## 2015-03-07 DIAGNOSIS — D259 Leiomyoma of uterus, unspecified: Secondary | ICD-10-CM | POA: Diagnosis not present

## 2015-03-07 HISTORY — DX: Other complications of anesthesia, initial encounter: T88.59XA

## 2015-03-07 HISTORY — DX: Other specified postprocedural states: Z98.890

## 2015-03-07 HISTORY — DX: Adverse effect of unspecified anesthetic, initial encounter: T41.45XA

## 2015-03-07 HISTORY — DX: Nausea with vomiting, unspecified: R11.2

## 2015-03-07 LAB — COMPREHENSIVE METABOLIC PANEL
ALBUMIN: 3.7 g/dL (ref 3.5–5.0)
ALT: 18 U/L (ref 14–54)
ANION GAP: 5 (ref 5–15)
AST: 24 U/L (ref 15–41)
Alkaline Phosphatase: 71 U/L (ref 38–126)
BUN: 15 mg/dL (ref 6–20)
CO2: 26 mmol/L (ref 22–32)
CREATININE: 0.97 mg/dL (ref 0.44–1.00)
Calcium: 9.2 mg/dL (ref 8.9–10.3)
Chloride: 107 mmol/L (ref 101–111)
Glucose, Bld: 89 mg/dL (ref 65–99)
Potassium: 3.7 mmol/L (ref 3.5–5.1)
SODIUM: 138 mmol/L (ref 135–145)
Total Bilirubin: 0.7 mg/dL (ref 0.3–1.2)
Total Protein: 6.8 g/dL (ref 6.5–8.1)

## 2015-03-07 LAB — CBC
HCT: 38.1 % (ref 36.0–46.0)
Hemoglobin: 12.5 g/dL (ref 12.0–15.0)
MCH: 28.8 pg (ref 26.0–34.0)
MCHC: 32.8 g/dL (ref 30.0–36.0)
MCV: 87.8 fL (ref 78.0–100.0)
Platelets: 183 10*3/uL (ref 150–400)
RBC: 4.34 MIL/uL (ref 3.87–5.11)
RDW: 13.2 % (ref 11.5–15.5)
WBC: 5.7 10*3/uL (ref 4.0–10.5)

## 2015-03-07 LAB — URINALYSIS, ROUTINE W REFLEX MICROSCOPIC
Bilirubin Urine: NEGATIVE
Glucose, UA: NEGATIVE mg/dL
Ketones, ur: NEGATIVE mg/dL
LEUKOCYTES UA: NEGATIVE
Nitrite: NEGATIVE
PROTEIN: NEGATIVE mg/dL
SPECIFIC GRAVITY, URINE: 1.02 (ref 1.005–1.030)
Urobilinogen, UA: 0.2 mg/dL (ref 0.0–1.0)
pH: 6 (ref 5.0–8.0)

## 2015-03-07 LAB — URINE MICROSCOPIC-ADD ON

## 2015-03-07 LAB — TYPE AND SCREEN
ABO/RH(D): O POS
ANTIBODY SCREEN: NEGATIVE

## 2015-03-07 LAB — HCG, SERUM, QUALITATIVE: Preg, Serum: NEGATIVE

## 2015-03-07 NOTE — Patient Instructions (Signed)
GRACEANNA THEISSEN  03/07/2015    Your procedure is scheduled on 03/11/14.  Report to [facility] at 06:15 A.M.  Call this number if you have problems the morning of surgery: None  [phone]   Remember:  Do not eat food or drink liquids after midnight.  Take these medicines the morning of surgery with A SIP OF WATER: None   Do not wear jewelry, make-up or nail polish.  Do not wear lotions, powders, or perfumes.  You may wear deodorant.  Do not shave 48 hours prior to surgery.  Men may shave face and neck.  Do not bring valuables to the hospital.  [facility] is not responsible for any belongings or valuables.  Contacts, dentures or bridgework may not be worn into surgery.  Leave your suitcase in the car.  After surgery it may be brought to your room.  For patients admitted to the hospital, discharge time will be determined by your treatment team.  Patients discharged the day of surgery will not be allowed to drive home.   Special instructions:  Shower using Hibiclens (CHG bath) the night before surgery and the morning of surgery.  Please read over the following fact sheets that you were given. Anesthesia Post-op Instructions    Supracervical Hysterectomy A supracervical hysterectomy is surgery to remove the top part of the uterus, but not the cervix. You will no longer have menstrual periods or be able to get pregnant after this surgery. The fallopian tubes and ovaries may also be removed (bilateral salpingo-oophorectomy) during this surgery. This surgery is usually performed using a minimally invasive technique called laparoscopy. This technique allows the surgery to be done through small incisions. The minimally invasive technique provides benefits such as less pain, less risk of infection, and shorter recovery time. LET [facility] CARE PROVIDER KNOW ABOUT:  Any allergies you have.  All medicines you are taking, including vitamins, herbs, eye drops, creams, and  over-the-counter medicines.  Previous problems you or members of your family have had with the use of anesthetics.  Any blood disorders you have.  Previous surgeries you have had.  Medical conditions you have. RISKS AND COMPLICATIONS  Generally, this is a safe procedure. However, as with any procedure, complications can occur. Possible complications include:  Bleeding.  Blood clots in the legs or lung.  Infection.  Injury to surrounding organs.  Problems related to anesthesia.  Conversion to an open abdominal surgery.  Additional surgery later to remove the cervix if you have problems with the cervix. BEFORE THE PROCEDURE  Ask your health care provider about changing or stopping your regular medicines.  Do not take aspirin or blood thinners (anticoagulants) for 1 week before the surgery, or as directed by your health care provider.  Do not eat or drink anything for 8 hours before the surgery, or as directed by your health care provider.  Quit smoking if you smoke.  Arrange for a ride home after surgery and for someone to help you at home during recovery. PROCEDURE   You will be given an antibiotic medicine.  An IV tube will be placed in one of your veins. You will be given medicine to make you sleep (general anesthetic).  A gas (carbon dioxide) will be used to inflate your abdomen. This will allow your surgeon to look inside your abdomen, perform your surgery, and treat any other problems found if necessary.  Three or four small incisions will be made in your abdomen. One of these  incisions will be made in the area of your belly button (navel). A thin, flexible tube with a tiny camera and light on the end of it (laparoscope) will be inserted into the incision. The camera on the laparoscope sends a picture to a TV screen in the operating room. This gives your surgeon a good view inside the abdomen.  Other surgical instruments will be inserted through the other  incisions.  The uterus will be cut into small pieces and removed through the small incisions.  Your incisions will be closed. AFTER THE PROCEDURE   You will be taken to a recovery area where your progress will be monitored until you are awake, stable, and taking fluids well. If there are no other problems, you will then be moved to a regular hospital room, or you will be allowed to go home.  You will likely have minimal discomfort after the surgery because the incisions are so small with the laparoscopic technique.  You will be given pain medicine while you are in the hospital and for when you go home.  If a bilateral salpingo-oophorectomy was performed before menopause, you will go through a sudden (abrupt) menopause. This can be helped with hormone medicines. Document Released: 03/09/2008 Document Revised: 07/12/2013 Document Reviewed: 03/24/2013 [facility] Patient Information 2015 [place], [place]. This information is not intended to replace advice given to you by your health care provider. Make sure you discuss any questions you have with your health care provider.    Bilateral Salpingo-Oophorectomy Bilateral salpingo-oophorectomy is the surgical removal of both fallopian tubes and both ovaries. The ovaries are small organs that produce eggs in women. The fallopian tubes transport the egg from the ovary to the womb (uterus). Usually, when this surgery is done, the uterus was previously removed. A bilateral salpingo-oophorectomy may be done to treat cancer or to reduce the risk of cancer in women who are at high risk. Removing both fallopian tubes and both ovaries will make you unable to become pregnant (sterile). It will also put you into menopause so that you will no longer have menstrual periods and may have menopausal symptoms such as hot flashes, night sweats, and mood changes. It will not affect your sex drive. LET [facility] CARE PROVIDER KNOW ABOUT:  Any allergies you have.  All  medicines you are taking, including vitamins, herbs, eye drops, creams, and over-the-counter medicines.  Previous problems you or members of your family have had with the use of anesthetics.  Any blood disorders you have.  Previous surgeries you have had.  Medical conditions you have. RISKS AND COMPLICATIONS Generally, this is a safe procedure. However, as with any procedure, complications can occur. Possible complications include:  Injury to surrounding organs.  Bleeding.  Infection.  Blood clots in the legs or lungs.  Problems related to anesthesia. BEFORE THE PROCEDURE  Ask your health care provider about changing or stopping your regular medicines. You may need to stop taking certain medicines, such as aspirin or blood thinners, at least 1 week before the surgery.  Do not eat or drink anything for at least 8 hours before the surgery.  If you smoke, do not smoke for at least 2 weeks before the surgery.  Make plans to have someone drive you home after the procedure or after your hospital stay. Also arrange for someone to help you with activities during recovery. PROCEDURE   You will be given medicine to help you relax before the procedure (sedative). You will then be given medicine to  make you sleep through the procedure (general anesthetic). These medicines will be given through an IV access tube that is put into one of your veins.  Once you are asleep, your lower abdomen will be shaved and cleaned. A thin, flexible tube (catheter) will be placed in your bladder.  The surgeon may use a laparoscopic, robotic, or open technique for this surgery:  In the laparoscopic technique, the surgery is done through two small cuts (incisions) in the abdomen. A thin, lighted tube with a tiny camera on the end (laparoscope) is inserted into one of the incisions. The tools needed for the procedure are put through the other incision.  A robotic technique may be chosen to perform complex  surgery in a small space. In the robotic technique, small incisions will be made. A camera and surgical instruments are passed through the incisions. Surgical instruments will be controlled with the help of a robotic arm.  In the open technique, the surgery is done through one large incision in the abdomen.  Using any of these techniques, the surgeon removes the fallopian tubes and ovaries. The blood vessels will be clamped and tied.  The surgeon then uses staples or stitches to close the incision or incisions. AFTER THE PROCEDURE  You will be taken to a recovery area where you will be monitored for 1 to 3 hours. Your blood pressure, pulse, and temperature will be checked often. You will remain in the recovery area until you are stable and waking up.  If the laparoscopic technique was used, you may be allowed to go home after several hours. You may have some shoulder pain after the laparoscopic procedure. This is normal and usually goes away in a day or two.  If the open technique was used, you will be admitted to the hospital for a couple of days.  You will be given pain medicine as needed.  The IV access tube and catheter will be removed before you are discharged. Document Released: 09/21/2005 Document Revised: 09/26/2013 Document Reviewed: 03/15/2013 [facility] Patient Information 2015 [place], [place]. This information is not intended to replace advice given to you by your health care provider. Make sure you discuss any questions you have with your health care provider.    PATIENT INSTRUCTIONS POST-ANESTHESIA  IMMEDIATELY FOLLOWING SURGERY:  Do not drive or operate machinery for the first twenty four hours after surgery.  Do not make any important decisions for twenty four hours after surgery or while taking narcotic pain medications or sedatives.  If you develop intractable nausea and vomiting or a severe headache please notify your doctor immediately.  FOLLOW-UP:  Please make an  appointment with your surgeon as instructed. You do not need to follow up with anesthesia unless specifically instructed to do so.  WOUND CARE INSTRUCTIONS (if applicable):  Keep a dry clean dressing on the anesthesia/puncture wound site if there is drainage.  Once the wound has quit draining you may leave it open to air.  Generally you should leave the bandage intact for twenty four hours unless there is drainage.  If the epidural site drains for more than 36-48 hours please call the anesthesia department.  QUESTIONS?:  Please feel free to call your physician or the hospital operator if you have any questions, and they will be happy to assist you.

## 2015-03-11 NOTE — H&P (Signed)
Expand All Collapse All   Patient ID: Cheyenne Harris, female DOB: [birth year], 49 y.o. MRN: [id number]  Preoperative History and Physical  Cheyenne Harris is a 49 y.o. No obstetric history on file. here for surgical management of symptomatic uterine fibroids. No significant preoperative concerns. She denies any symptoms of URI at this time. She has had a normal pap smear this year , with negative HPV. She has been on Lupron supression since March, with significant reduction in Uterine size, allowing now for a transverse uterine incision. Original uterine size was 16-18 wk size, now estimated at 12 wk size, with a thin endometrium.   Proposed surgery: open supracervical hysterectomy and bilateral salpingectomy.  Past Medical History  Diagnosis Date  . Hyperlipidemia   . Hypertension    History reviewed. No pertinent past surgical history. OB History  No data available  Patient denies any other pertinent gynecologic issues.   Current Outpatient Prescriptions on File Prior to Visit  Medication Sig Dispense Refill  . KRILL OIL PO Take 1 capsule by mouth daily.    Marland Kitchen loratadine (CLARITIN) 10 MG tablet Take 10 mg by mouth daily.    . Multiple Vitamins-Minerals (MULTIVITAMIN WITH MINERALS) tablet Take 1 tablet by mouth daily.    . psyllium (METAMUCIL) 58.6 % powder Take 1 packet by mouth as needed.     . rosuvastatin (CRESTOR) 5 MG tablet Take 1 tablet (5 mg total) by mouth daily. 90 tablet 1  . leuprolide (LUPRON) 3.75 MG injection Inject 11.25 mg into the muscle once. (Patient not taking: Reported on 02/28/2015) 1 kit 1  . megestrol (MEGACE) 40 MG tablet Take 1 tablet (40 mg total) by mouth 3 (three) times daily. (Patient not taking: Reported on 03/06/2015) 45 tablet 2  . Norgestimate-Ethinyl Estradiol Triphasic (ORTHO TRI-CYCLEN LO) 0.18/0.215/0.25 MG-25 MCG tab Take 1 tablet by mouth daily. (Patient not taking: Reported on  02/28/2015) 1 Package 4   No current facility-administered medications on file prior to visit.   Allergies  Allergen Reactions  . Sulfa Antibiotics Hives    Social History:  reports that she has never smoked. She has never used smokeless tobacco. She reports that she drinks alcohol. She reports that she does not use illicit drugs.  Family History  Problem Relation Age of Onset  . Hypertension Mother   . Hypertension Sister   . Hypertension Brother   . Diabetes Maternal Grandmother   . Heart disease Maternal Grandmother     Review of Systems: Noncontributory  PHYSICAL EXAM: Blood pressure 138/84, pulse 64, height $RemoveBe'5\' 4"'OlHjYjyoR$  (1.626 m), weight 151 lb 12.8 oz (68.856 kg). General appearance - alert, well appearing, and in no distress Chest - clear to auscultation, no wheezes, rales or rhonchi, symmetric air entry Heart - normal rate and regular rhythm Abdomen - soft, nontender, nondistended, no masses or organomegaly Pelvic - examination not indicated se notes from last exam , 12 wk sized uterus. Extremities - peripheral pulses normal, no pedal edema, no clubbing or cyanosis  Labs: No results found for this or any previous visit (from the past 36 hour(s)).  Imaging Studies:  Imaging Results    No results found.    Assessment: Patient Active Problem List   Diagnosis Date Noted  . Heavy periods 10/19/2014  . Routine general medical examination at a health care facility 03/25/2013  . Fibroids, intramural 03/13/2013  . Urinary incontinence 03/13/2013  . CONSTIPATION, CHRONIC 02/27/2010  . Hyperlipidemia LDL goal <100 01/11/2008    Plan: Patient  will undergo surgical management with open supracervical hysterectomy and bilateral salpingectomy. F/u in 2 weeks for first post-op visit.    Jonnie Kind, MD  03/06/2015 4:34 PM

## 2015-03-12 ENCOUNTER — Inpatient Hospital Stay (HOSPITAL_COMMUNITY)
Admission: RE | Admit: 2015-03-12 | Discharge: 2015-03-14 | DRG: 742 | Disposition: A | Discharge status: Home / Self Care | Payer: 59 | Source: Ambulatory Visit | Attending: Obstetrics and Gynecology | Admitting: Obstetrics and Gynecology

## 2015-03-12 ENCOUNTER — Encounter (HOSPITAL_COMMUNITY)
Admission: RE | Disposition: A | Discharge status: Home / Self Care | Payer: Self-pay | Source: Ambulatory Visit | Attending: Obstetrics and Gynecology

## 2015-03-12 ENCOUNTER — Inpatient Hospital Stay (HOSPITAL_COMMUNITY): Payer: 59 | Admitting: Anesthesiology

## 2015-03-12 ENCOUNTER — Encounter (HOSPITAL_COMMUNITY): Payer: Self-pay | Admitting: *Deleted

## 2015-03-12 DIAGNOSIS — Z8249 Family history of ischemic heart disease and other diseases of the circulatory system: Secondary | ICD-10-CM

## 2015-03-12 DIAGNOSIS — D62 Acute posthemorrhagic anemia: Secondary | ICD-10-CM | POA: Diagnosis not present

## 2015-03-12 DIAGNOSIS — N838 Other noninflammatory disorders of ovary, fallopian tube and broad ligament: Secondary | ICD-10-CM

## 2015-03-12 DIAGNOSIS — Z90710 Acquired absence of both cervix and uterus: Secondary | ICD-10-CM | POA: Diagnosis present

## 2015-03-12 DIAGNOSIS — E785 Hyperlipidemia, unspecified: Secondary | ICD-10-CM | POA: Diagnosis present

## 2015-03-12 DIAGNOSIS — I10 Essential (primary) hypertension: Secondary | ICD-10-CM | POA: Diagnosis present

## 2015-03-12 DIAGNOSIS — D259 Leiomyoma of uterus, unspecified: Principal | ICD-10-CM | POA: Diagnosis present

## 2015-03-12 DIAGNOSIS — Z833 Family history of diabetes mellitus: Secondary | ICD-10-CM | POA: Diagnosis not present

## 2015-03-12 DIAGNOSIS — D251 Intramural leiomyoma of uterus: Secondary | ICD-10-CM | POA: Diagnosis not present

## 2015-03-12 HISTORY — PX: SUPRACERVICAL ABDOMINAL HYSTERECTOMY: SHX5393

## 2015-03-12 HISTORY — PX: BILATERAL SALPINGECTOMY: SHX5743

## 2015-03-12 LAB — CBC
HCT: 32.4 % — ABNORMAL LOW (ref 36.0–46.0)
HCT: 31.4 % — ABNORMAL LOW (ref 36.0–46.0)
Hemoglobin: 10.7 g/dL — ABNORMAL LOW (ref 12.0–15.0)
Hemoglobin: 10.4 g/dL — ABNORMAL LOW (ref 12.0–15.0)
MCH: 29.3 pg (ref 26.0–34.0)
MCH: 29.3 pg (ref 26.0–34.0)
MCHC: 33 g/dL (ref 30.0–36.0)
MCHC: 33.1 g/dL (ref 30.0–36.0)
MCV: 88.8 fL (ref 78.0–100.0)
MCV: 88.5 fL (ref 78.0–100.0)
PLATELETS: 179 10*3/uL (ref 150–400)
PLATELETS: 180 10*3/uL (ref 150–400)
RBC: 3.65 MIL/uL — AB (ref 3.87–5.11)
RBC: 3.55 MIL/uL — AB (ref 3.87–5.11)
RDW: 13.3 % (ref 11.5–15.5)
RDW: 13.2 % (ref 11.5–15.5)
WBC: 13.3 10*3/uL — AB (ref 4.0–10.5)
WBC: 13.2 10*3/uL — AB (ref 4.0–10.5)

## 2015-03-12 LAB — BASIC METABOLIC PANEL
Anion gap: 9 (ref 5–15)
BUN: 16 mg/dL (ref 6–20)
CO2: 26 mmol/L (ref 22–32)
Calcium: 8.7 mg/dL — ABNORMAL LOW (ref 8.9–10.3)
Chloride: 105 mmol/L (ref 101–111)
Creatinine, Ser: 1.06 mg/dL — ABNORMAL HIGH (ref 0.44–1.00)
GFR calc non Af Amer: >60 mL/min (ref >60)
Glucose, Bld: 170 mg/dL — ABNORMAL HIGH (ref 65–99)
Potassium: 4.5 mmol/L (ref 3.5–5.1)
SODIUM: 140 mmol/L (ref 135–145)

## 2015-03-12 LAB — GLUCOSE, CAPILLARY: Glucose-Capillary: 185 mg/dL — ABNORMAL HIGH (ref 65–99)

## 2015-03-12 LAB — PREPARE RBC (CROSSMATCH)

## 2015-03-12 SURGERY — HYSTERECTOMY, SUPRACERVICAL, ABDOMINAL
Anesthesia: General

## 2015-03-12 MED ORDER — BUPIVACAINE LIPOSOME 1.3 % IJ SUSP
INTRAMUSCULAR | Status: AC
  Filled 2015-03-12: qty 20

## 2015-03-12 MED ORDER — SODIUM CHLORIDE 0.9 % IV SOLN
Freq: Once | INTRAVENOUS | Status: AC
  Administered 2015-03-12: 17:00:00 via INTRAVENOUS

## 2015-03-12 MED ORDER — FENTANYL CITRATE (PF) 100 MCG/2ML IJ SOLN
25.0000 ug | INTRAMUSCULAR | Status: DC | PRN
  Administered 2015-03-12: 50 ug via INTRAVENOUS
  Administered 2015-03-12 (×2): 25 ug via INTRAVENOUS
  Administered 2015-03-12 (×2): 50 ug via INTRAVENOUS

## 2015-03-12 MED ORDER — BUPIVACAINE HCL (PF) 0.5 % IJ SOLN
INTRAMUSCULAR | Status: DC | PRN
  Administered 2015-03-12: 13 mL

## 2015-03-12 MED ORDER — SODIUM CHLORIDE 0.9 % IJ SOLN
9.0000 mL | INTRAMUSCULAR | Status: DC | PRN
Start: 2015-03-12 — End: 2015-03-13

## 2015-03-12 MED ORDER — LABETALOL HCL 5 MG/ML IV SOLN
10.0000 mg | INTRAVENOUS | Status: DC | PRN
  Administered 2015-03-12: 10 mg via INTRAVENOUS

## 2015-03-12 MED ORDER — KETOROLAC TROMETHAMINE 30 MG/ML IJ SOLN: 30.0000 mg | Freq: Four times a day (QID) | INTRAMUSCULAR | Status: DC

## 2015-03-12 MED ORDER — SODIUM CHLORIDE 0.9 % IV SOLN: Freq: Once | INTRAVENOUS | Status: AC

## 2015-03-12 MED ORDER — MIDAZOLAM HCL 5 MG/5ML IJ SOLN
INTRAMUSCULAR | Status: DC | PRN
  Administered 2015-03-12: 2 mg via INTRAVENOUS

## 2015-03-12 MED ORDER — MIDAZOLAM HCL 2 MG/2ML IJ SOLN
INTRAMUSCULAR | Status: AC
  Filled 2015-03-12: qty 2

## 2015-03-12 MED ORDER — FENTANYL CITRATE (PF) 100 MCG/2ML IJ SOLN
INTRAMUSCULAR | Status: AC
Start: 2015-03-12 — End: 2015-03-12
  Filled 2015-03-12: qty 2

## 2015-03-12 MED ORDER — GLYCOPYRROLATE 0.2 MG/ML IJ SOLN
INTRAMUSCULAR | Status: DC | PRN
  Administered 2015-03-12: 0.6 mg via INTRAVENOUS

## 2015-03-12 MED ORDER — ONDANSETRON HCL 4 MG/2ML IJ SOLN
4.0000 mg | Freq: Once | INTRAMUSCULAR | Status: AC
  Administered 2015-03-12: 4 mg via INTRAVENOUS

## 2015-03-12 MED ORDER — PANTOPRAZOLE SODIUM 40 MG PO TBEC
40.0000 mg | DELAYED_RELEASE_TABLET | Freq: Every day | ORAL | Status: DC
  Administered 2015-03-13 – 2015-03-14 (×2): 40 mg via ORAL
  Filled 2015-03-12 (×2): qty 1

## 2015-03-12 MED ORDER — LIDOCAINE HCL (PF) 1 % IJ SOLN
INTRAMUSCULAR | Status: AC
  Filled 2015-03-12: qty 5

## 2015-03-12 MED ORDER — CEFAZOLIN SODIUM-DEXTROSE 2-3 GM-% IV SOLR
INTRAVENOUS | Status: AC
  Filled 2015-03-12: qty 50

## 2015-03-12 MED ORDER — DIPHENHYDRAMINE HCL 50 MG/ML IJ SOLN: 12.5000 mg | Freq: Four times a day (QID) | INTRAMUSCULAR | Status: DC | PRN

## 2015-03-12 MED ORDER — LABETALOL HCL 5 MG/ML IV SOLN
INTRAVENOUS | Status: AC
  Filled 2015-03-12: qty 4

## 2015-03-12 MED ORDER — MIDAZOLAM HCL 2 MG/2ML IJ SOLN
1.0000 mg | INTRAMUSCULAR | Status: DC | PRN
  Administered 2015-03-12 (×2): 2 mg via INTRAVENOUS
  Filled 2015-03-12: qty 2

## 2015-03-12 MED ORDER — KETOROLAC TROMETHAMINE 30 MG/ML IJ SOLN
30.0000 mg | Freq: Four times a day (QID) | INTRAMUSCULAR | Status: DC
  Administered 2015-03-12 – 2015-03-14 (×8): 30 mg via INTRAVENOUS
  Filled 2015-03-12 (×8): qty 1

## 2015-03-12 MED ORDER — SUFENTANIL CITRATE 50 MCG/ML IV SOLN
INTRAVENOUS | Status: AC
  Filled 2015-03-12: qty 1

## 2015-03-12 MED ORDER — SODIUM CHLORIDE 0.9 % IJ SOLN
INTRAMUSCULAR | Status: AC
  Filled 2015-03-12: qty 20

## 2015-03-12 MED ORDER — CEFAZOLIN SODIUM-DEXTROSE 2-3 GM-% IV SOLR
2.0000 g | INTRAVENOUS | Status: AC
  Administered 2015-03-12: 2 g via INTRAVENOUS

## 2015-03-12 MED ORDER — ONDANSETRON HCL 4 MG/2ML IJ SOLN: 4.0000 mg | Freq: Once | INTRAMUSCULAR | Status: DC | PRN

## 2015-03-12 MED ORDER — NEOSTIGMINE METHYLSULFATE 10 MG/10ML IV SOLN
INTRAVENOUS | Status: AC
  Filled 2015-03-12: qty 1

## 2015-03-12 MED ORDER — 0.9 % SODIUM CHLORIDE (POUR BTL) OPTIME
TOPICAL | Status: DC | PRN
  Administered 2015-03-12: 2000 mL

## 2015-03-12 MED ORDER — SODIUM CHLORIDE 0.9 % IV SOLN
INTRAVENOUS | Status: DC
Start: 2015-03-12 — End: 2015-03-13
  Administered 2015-03-12 – 2015-03-13 (×2): via INTRAVENOUS

## 2015-03-12 MED ORDER — IBUPROFEN 600 MG PO TABS: 600.0000 mg | ORAL_TABLET | Freq: Four times a day (QID) | ORAL | Status: DC | PRN

## 2015-03-12 MED ORDER — DIPHENHYDRAMINE HCL 12.5 MG/5ML PO ELIX: 12.5000 mg | ORAL_SOLUTION | Freq: Four times a day (QID) | ORAL | Status: DC | PRN

## 2015-03-12 MED ORDER — ONDANSETRON HCL 4 MG PO TABS: 4.0000 mg | ORAL_TABLET | Freq: Four times a day (QID) | ORAL | Status: DC | PRN

## 2015-03-12 MED ORDER — HYDROMORPHONE 0.3 MG/ML IV SOLN
INTRAVENOUS | Status: DC
  Administered 2015-03-12: 12:00:00 via INTRAVENOUS

## 2015-03-12 MED ORDER — LACTATED RINGERS IV SOLN
INTRAVENOUS | Status: DC
  Administered 2015-03-12: 08:00:00 via INTRAVENOUS
  Administered 2015-03-12: 1000 mL via INTRAVENOUS

## 2015-03-12 MED ORDER — LIDOCAINE HCL 1 % IJ SOLN
INTRAMUSCULAR | Status: DC | PRN
  Administered 2015-03-12: 30 mg via INTRADERMAL

## 2015-03-12 MED ORDER — ROCURONIUM BROMIDE 100 MG/10ML IV SOLN
INTRAVENOUS | Status: DC | PRN
  Administered 2015-03-12: 40 mg via INTRAVENOUS

## 2015-03-12 MED ORDER — FUROSEMIDE 10 MG/ML IJ SOLN
20.0000 mg | Freq: Once | INTRAMUSCULAR | Status: AC
  Administered 2015-03-12: 20 mg via INTRAVENOUS
  Filled 2015-03-12: qty 2

## 2015-03-12 MED ORDER — HYDROMORPHONE 0.3 MG/ML IV SOLN
INTRAVENOUS | Status: AC
  Filled 2015-03-12: qty 25

## 2015-03-12 MED ORDER — ROCURONIUM BROMIDE 50 MG/5ML IV SOLN
INTRAVENOUS | Status: AC
  Filled 2015-03-12: qty 1

## 2015-03-12 MED ORDER — SODIUM CHLORIDE 0.9 % IV SOLN
INTRAVENOUS | Status: DC
  Administered 2015-03-12: 13:00:00 via INTRAVENOUS

## 2015-03-12 MED ORDER — BUPIVACAINE HCL (PF) 0.5 % IJ SOLN
INTRAMUSCULAR | Status: AC
  Filled 2015-03-12: qty 30

## 2015-03-12 MED ORDER — ONDANSETRON HCL 4 MG/2ML IJ SOLN: 4.0000 mg | Freq: Four times a day (QID) | INTRAMUSCULAR | Status: DC | PRN

## 2015-03-12 MED ORDER — SODIUM CHLORIDE 0.9 % IV BOLUS (SEPSIS)
500.0000 mL | Freq: Once | INTRAVENOUS | Status: AC
  Administered 2015-03-12: 500 mL via INTRAVENOUS

## 2015-03-12 MED ORDER — NALOXONE HCL 0.4 MG/ML IJ SOLN: 0.4000 mg | INTRAMUSCULAR | Status: DC | PRN

## 2015-03-12 MED ORDER — SUFENTANIL CITRATE 50 MCG/ML IV SOLN
INTRAVENOUS | Status: DC | PRN
  Administered 2015-03-12 (×4): 10 ug via INTRAVENOUS

## 2015-03-12 MED ORDER — GLYCOPYRROLATE 0.2 MG/ML IJ SOLN
INTRAMUSCULAR | Status: AC
  Filled 2015-03-12: qty 3

## 2015-03-12 MED ORDER — PROPOFOL 10 MG/ML IV BOLUS
INTRAVENOUS | Status: AC
  Filled 2015-03-12: qty 20

## 2015-03-12 MED ORDER — NEOSTIGMINE METHYLSULFATE 10 MG/10ML IV SOLN
INTRAVENOUS | Status: DC | PRN
  Administered 2015-03-12: 2 mg via INTRAVENOUS
  Administered 2015-03-12 (×2): 1 mg via INTRAVENOUS

## 2015-03-12 MED ORDER — DEXAMETHASONE SODIUM PHOSPHATE 4 MG/ML IJ SOLN
INTRAMUSCULAR | Status: AC
  Filled 2015-03-12: qty 1

## 2015-03-12 MED ORDER — SODIUM CHLORIDE 0.9 % IJ SOLN
INTRAMUSCULAR | Status: AC
  Filled 2015-03-12: qty 10

## 2015-03-12 MED ORDER — PROPOFOL 10 MG/ML IV BOLUS
INTRAVENOUS | Status: DC | PRN
  Administered 2015-03-12: 150 mg via INTRAVENOUS

## 2015-03-12 MED ORDER — KETOROLAC TROMETHAMINE 30 MG/ML IJ SOLN
30.0000 mg | Freq: Once | INTRAMUSCULAR | Status: AC
  Administered 2015-03-12: 30 mg via INTRAVENOUS
  Filled 2015-03-12: qty 1

## 2015-03-12 MED ORDER — DEXAMETHASONE SODIUM PHOSPHATE 4 MG/ML IJ SOLN
4.0000 mg | Freq: Once | INTRAMUSCULAR | Status: AC
  Administered 2015-03-12: 4 mg via INTRAVENOUS

## 2015-03-12 MED ORDER — FENTANYL CITRATE (PF) 100 MCG/2ML IJ SOLN
INTRAMUSCULAR | Status: AC
  Filled 2015-03-12: qty 2

## 2015-03-12 MED ORDER — SODIUM CHLORIDE 0.9 % IV SOLN: Freq: Once | INTRAVENOUS | Status: DC

## 2015-03-12 MED ORDER — ONDANSETRON HCL 4 MG/2ML IJ SOLN
INTRAMUSCULAR | Status: AC
  Filled 2015-03-12: qty 2

## 2015-03-12 MED ORDER — OXYCODONE-ACETAMINOPHEN 5-325 MG PO TABS
1.0000 | ORAL_TABLET | ORAL | Status: DC | PRN
  Administered 2015-03-13: 1 via ORAL
  Filled 2015-03-12: qty 2

## 2015-03-12 SURGICAL SUPPLY — 56 items
BAG HAMPER (MISCELLANEOUS) ×4 IMPLANT
BENZOIN TINCTURE PRP APPL 2/3 (GAUZE/BANDAGES/DRESSINGS) ×4 IMPLANT
CELLS DAT CNTRL 66122 CELL SVR (MISCELLANEOUS) ×2 IMPLANT
CLOSURE WOUND 1/2 X4 (GAUZE/BANDAGES/DRESSINGS) ×1
CLOTH BEACON ORANGE TIMEOUT ST (SAFETY) ×4 IMPLANT
COVER LIGHT HANDLE STERIS (MISCELLANEOUS) ×8 IMPLANT
DRAPE WARM FLUID 44X44 (DRAPE) ×4 IMPLANT
DRSG OPSITE POSTOP 4X8 (GAUZE/BANDAGES/DRESSINGS) ×4 IMPLANT
DURAPREP 26ML APPLICATOR (WOUND CARE) ×4 IMPLANT
ELECT REM PT RETURN 9FT ADLT (ELECTROSURGICAL) ×4
ELECTRODE REM PT RTRN 9FT ADLT (ELECTROSURGICAL) ×2 IMPLANT
EVACUATOR DRAINAGE 10X20 100CC (DRAIN) IMPLANT
EVACUATOR SILICONE 100CC (DRAIN)
GAUZE SPONGE 4X4 12PLY STRL (GAUZE/BANDAGES/DRESSINGS) IMPLANT
GLOVE BIO SURGEON STRL SZ 6.5 (GLOVE) ×3 IMPLANT
GLOVE BIO SURGEONS STRL SZ 6.5 (GLOVE) ×1
GLOVE BIOGEL PI IND STRL 7.0 (GLOVE) ×4 IMPLANT
GLOVE BIOGEL PI IND STRL 7.5 (GLOVE) ×2 IMPLANT
GLOVE BIOGEL PI IND STRL 9 (GLOVE) ×2 IMPLANT
GLOVE BIOGEL PI INDICATOR 7.0 (GLOVE) ×4
GLOVE BIOGEL PI INDICATOR 7.5 (GLOVE) ×2
GLOVE BIOGEL PI INDICATOR 9 (GLOVE) ×2
GLOVE ECLIPSE 6.5 STRL STRAW (GLOVE) ×4 IMPLANT
GLOVE ECLIPSE 9.0 STRL (GLOVE) ×4 IMPLANT
GOWN SPEC L3 XXLG W/TWL (GOWN DISPOSABLE) ×4 IMPLANT
GOWN STRL REUS W/TWL LRG LVL3 (GOWN DISPOSABLE) ×8 IMPLANT
INST SET MAJOR GENERAL (KITS) ×4 IMPLANT
KIT ROOM TURNOVER APOR (KITS) ×4 IMPLANT
MANIFOLD NEPTUNE II (INSTRUMENTS) ×4 IMPLANT
NEEDLE HYPO 18GX1.5 BLUNT FILL (NEEDLE) ×4 IMPLANT
NEEDLE HYPO 25X1 1.5 SAFETY (NEEDLE) ×4 IMPLANT
NS IRRIG 1000ML POUR BTL (IV SOLUTION) ×8 IMPLANT
PACK ABDOMINAL MAJOR (CUSTOM PROCEDURE TRAY) ×4 IMPLANT
PAD ARMBOARD 7.5X6 YLW CONV (MISCELLANEOUS) ×4 IMPLANT
RETRACTOR WND ALEXIS 25 LRG (MISCELLANEOUS) IMPLANT
RTRCTR WOUND ALEXIS 18CM MED (MISCELLANEOUS) ×4
RTRCTR WOUND ALEXIS 25CM LRG (MISCELLANEOUS)
SET BASIN LINEN APH (SET/KITS/TRAYS/PACK) ×4 IMPLANT
SPONGE DRAIN TRACH 4X4 STRL 2S (GAUZE/BANDAGES/DRESSINGS) IMPLANT
SPONGE LAP 18X18 X RAY DECT (DISPOSABLE) IMPLANT
STRIP CLOSURE SKIN 1/2X4 (GAUZE/BANDAGES/DRESSINGS) ×3 IMPLANT
SUT CHROMIC 0 CT 1 (SUTURE) ×48 IMPLANT
SUT CHROMIC 2 0 CT 1 (SUTURE) ×8 IMPLANT
SUT CHROMIC GUT BROWN 0 54 (SUTURE) IMPLANT
SUT CHROMIC GUT BROWN 0 54IN (SUTURE)
SUT ETHILON 3 0 FSL (SUTURE) IMPLANT
SUT PDS AB CT VIOLET #0 27IN (SUTURE) IMPLANT
SUT PLAIN CT 1/2CIR 2-0 27IN (SUTURE) ×4 IMPLANT
SUT PROLENE 0 CT 1 30 (SUTURE) IMPLANT
SUT VIC AB 0 CT1 27 (SUTURE) ×2
SUT VIC AB 0 CT1 27XBRD ANTBC (SUTURE) ×2 IMPLANT
SUT VICRYL 4 0 KS 27 (SUTURE) ×4 IMPLANT
SUT VICRYL AB 2 0 TIES (SUTURE) IMPLANT
TOWEL BLUE STERILE X RAY DET (MISCELLANEOUS) ×4 IMPLANT
TOWEL OR 17X26 4PK STRL BLUE (TOWEL DISPOSABLE) ×4 IMPLANT
TRAY FOLEY CATH SILVER 16FR (SET/KITS/TRAYS/PACK) ×4 IMPLANT

## 2015-03-12 NOTE — Progress Notes (Signed)
Patient complaining of needing to have a bowel movement. Assisted patient up to bathroom. When patient got to bathroom stated she was dizzy and went unresponsive. Emergency response called. Patient BP 59/40 with heart rate 53. Assisted back to bed. Dr. Glo Herring notified. Dressing to abdomen saturated. Stat CBC ordered and NS bolus started.

## 2015-03-12 NOTE — Anesthesia Postprocedure Evaluation (Signed)
  Anesthesia Post-op Note  Patient: Cheyenne Harris  Procedure(s) Performed: Procedure(s): HYSTERECTOMY SUPRACERVICAL ABDOMINAL (N/A) BILATERAL SALPINGECTOMY (Bilateral)  Patient Location: PACU  Anesthesia Type:General  Level of Consciousness: awake, alert  and patient cooperative  Airway and Oxygen Therapy: Patient Spontanous Breathing and Patient connected to face mask oxygen  Post-op Pain: 3 /10, mild  Post-op Assessment: Post-op Vital signs reviewed, Patient's Cardiovascular Status Stable, Respiratory Function Stable, Patent Airway, No signs of Nausea or vomiting and Pain level controlled  Post-op Vital Signs: Reviewed and stable  Last Vitals:  Filed Vitals:   03/12/15 0921  BP: 151/87  Pulse:   Temp: 36.3 C  Resp: 12    Complications: No apparent anesthesia complications

## 2015-03-12 NOTE — Progress Notes (Signed)
Day of Surgery Procedure(s) (LRB): HYSTERECTOMY SUPRACERVICAL ABDOMINAL (N/A) BILATERAL SALPINGECTOMY (Bilateral)  Subjective: Patient reports lightheaded with bp FA21'H sysytolic with low pulse upon going to void. Urine output has been steady AT 100 cc/hr. sss.    Objective: I have reviewed patient's vital signs.  GI: incision: bloody and serous drainage present and soakin the telfa , dressing will be change.  Assessment: s/p Procedure(s): HYSTERECTOMY SUPRACERVICAL ABDOMINAL (N/A) BILATERAL SALPINGECTOMY (Bilateral): suspect vasovagal, wil check cbc and give fludi bolus  Plan: Cbc, check bolus fluids  LOS: 0 days    Melissaann Dizdarevic V 03/12/2015, 4:46 PM

## 2015-03-12 NOTE — Transfer of Care (Signed)
Immediate Anesthesia Transfer of Care Note  Patient: Cheyenne Harris  Procedure(s) Performed: Procedure(s): HYSTERECTOMY SUPRACERVICAL ABDOMINAL (N/A) BILATERAL SALPINGECTOMY (Bilateral)  Patient Location: PACU  Anesthesia Type:General  Level of Consciousness: awake and patient cooperative  Airway & Oxygen Therapy: Patient Spontanous Breathing and Patient connected to face mask oxygen  Post-op Assessment: Report given to RN, Post -op Vital signs reviewed and stable and Patient moving all extremities  Post vital signs: Reviewed and stable  Last Vitals:  Filed Vitals:   03/12/15 0725  BP: 146/90  Pulse:   Temp:   Resp: 15    Complications: No apparent anesthesia complications

## 2015-03-12 NOTE — Progress Notes (Signed)
Urine Output is 48 mL for the past hour. Paged Dr Elonda Husky, notifying of output.  Dr. Elonda Husky ordered 500 cc Bolus, to administer 20 mg Lasix after bolus, and to decrease continuous fluids to 125 ml/hr.  Will administer as soon as available. Will continue to monitor pt frequently throughout night.  VSS temp 98.6, BP 137/63, HR 67, O2 sat 100% on 2L O2, 18 Resp. Pt denies any pain at this time. Abdominal dressing is clean, dry & intact. Bed remains in lowest position and call bell is within reach.

## 2015-03-12 NOTE — Progress Notes (Addendum)
Day of Surgery Procedure(s) (LRB): HYSTERECTOMY SUPRACERVICAL ABDOMINAL (N/A) BILATERAL SALPINGECTOMY (Bilateral) Recheck after syncope and fluid bolus Subjective: Patient reports no further lightheadedness..    Objective: I have reviewed patient's vital signs, intake and output and labs. BP 148/69 mmHg  Pulse 66  Temp(Src) 98.1 F (36.7 C) (Oral)  Resp 18  Ht 5\' 4"  (1.626 m)  Wt 152 lb (68.947 kg)  BMI 26.08 kg/m2  SpO2 96%  LMP 10/11/2014 CBC Latest Ref Rng 03/12/2015 03/07/2015 11/07/2014  WBC 4.0 - 10.5 K/uL 13.3(H) 5.7 4.9  Hemoglobin 12.0 - 15.0 g/dL 10.7(L) 12.5 12.4  Hematocrit 36.0 - 46.0 % 32.4(L) 38.1 36.8  Platelets 150 - 400 K/uL 179 183 336   BMP Latest Ref Rng 03/12/2015 03/07/2015 10/10/2014  Glucose 65 - 99 mg/dL 170(H) 89 82  BUN 6 - 20 mg/dL 16 15 13   Creatinine 0.44 - 1.00 mg/dL 1.06(H) 0.97 1.01  Sodium 135 - 145 mmol/L 140 138 139  Potassium 3.5 - 5.1 mmol/L 4.5 3.7 4.2  Chloride 101 - 111 mmol/L 105 107 102  CO2 22 - 32 mmol/L 26 26 26   Calcium 8.9 - 10.3 mg/dL 8.7(L) 9.2 9.5    Intake/Output Summary (Last 24 hours) at 03/12/15 1808 Last data filed at 03/12/15 1656  Gross per 24 hour  Intake 2006.25 ml  Output    800 ml  Net 1206.25 ml      General: alert, cooperative, no distress and tolerating p.o. hungry. GI: incision: lite ooze from dressing Vaginal Bleeding: none  Assessment: s/p Procedure(s): HYSTERECTOMY SUPRACERVICAL ABDOMINAL (N/A) BILATERAL SALPINGECTOMY (Bilateral): suspect vasovagal, but will check cbc in 4 hrs.monitor urine output q1h for now  Plan: continue iv at 200/hr.  LOS: 0 days    Cheyenne Harris V 03/12/2015, 6:00 PM

## 2015-03-12 NOTE — Anesthesia Procedure Notes (Signed)
Procedure Name: Intubation Date/Time: 03/12/2015 7:43 AM Performed by: Charmaine Downs Pre-anesthesia Checklist: Patient being monitored, Suction available, Emergency Drugs available and Patient identified Patient Re-evaluated:Patient Re-evaluated prior to inductionOxygen Delivery Method: Circle system utilized Preoxygenation: Pre-oxygenation with 100% oxygen Intubation Type: IV induction Ventilation: Mask ventilation without difficulty and Oral airway inserted - appropriate to patient size Laryngoscope Size: Mac and 3 Grade View: Grade II Tube type: Oral Tube size: 7.0 mm Number of attempts: 1 Airway Equipment and Method: Stylet and Oral airway Placement Confirmation: ETT inserted through vocal cords under direct vision,  positive ETCO2 and breath sounds checked- equal and bilateral Secured at: 22 cm Tube secured with: Tape Dental Injury: Teeth and Oropharynx as per pre-operative assessment

## 2015-03-12 NOTE — Progress Notes (Signed)
Patient resting quietly at this time. Drowsy but easily arousable. Vital signs stable. Honeycomb dressing 3/4 covered with bright red blood. Will continue to monitor. Patient sipping sprite and water without problem. Dilaudid PCA set up. Patient and family educated on use of PCA and family informed not to hit the button but to allow the patient to hit the button. Will continue to monitor.

## 2015-03-12 NOTE — Anesthesia Preprocedure Evaluation (Signed)
Anesthesia Evaluation  Patient identified by MRN, date of birth, ID band Patient awake    Reviewed: Allergy & Precautions, NPO status , Patient's Chart, lab work & pertinent test results  History of Anesthesia Complications (+) PONV and history of anesthetic complications  Airway Mallampati: II  TM Distance: >3 FB     Dental  (+) Teeth Intact   Pulmonary neg pulmonary ROS,  breath sounds clear to auscultation        Cardiovascular hypertension (borderline), Rhythm:Regular Rate:Normal     Neuro/Psych    GI/Hepatic negative GI ROS,   Endo/Other    Renal/GU      Musculoskeletal   Abdominal   Peds  Hematology   Anesthesia Other Findings   Reproductive/Obstetrics                             Anesthesia Physical Anesthesia Plan  ASA: II  Anesthesia Plan: General   Post-op Pain Management:    Induction: Intravenous  Airway Management Planned: Oral ETT  Additional Equipment:   Intra-op Plan:   Post-operative Plan: Extubation in OR  Informed Consent: I have reviewed the patients History and Physical, chart, labs and discussed the procedure including the risks, benefits and alternatives for the proposed anesthesia with the patient or authorized representative who has indicated his/her understanding and acceptance.     Plan Discussed with:   Anesthesia Plan Comments: (labetolol in pre op.)        Anesthesia Quick Evaluation

## 2015-03-12 NOTE — Op Note (Signed)
03/12/2015  11:27 AM  PATIENT:  Cheyenne Harris  49 y.o. female  PRE-OPERATIVE DIAGNOSIS:  FIBROID UTERUS  POST-OPERATIVE DIAGNOSIS:  FIBROID UTERUS  PROCEDURE:  Procedure(s): HYSTERECTOMY SUPRACERVICAL ABDOMINAL (N/A) BILATERAL SALPINGECTOMY (Bilateral)  SURGEON:  Surgeon(s) and Role:    * Jonnie Kind, MD - Primary  PHYSICIAN ASSISTANT:   ASSISTANTS: Forest Gleason, RNFA   ANESTHESIA:   local and general  EBL:  Total I/O In: 1700 [I.V.:1700] Out: 400 [Urine:300; Blood:100]  BLOOD ADMINISTERED:none  DRAINS: Urinary Catheter (Foley)   LOCAL MEDICATIONS USED:  MARCAINE    and Amount: 20 ml  SPECIMEN:  Source of Specimen:  uterus , fallopian tubes  DISPOSITION OF SPECIMEN:  PATHOLOGY  COUNTS:  YES  TOURNIQUET:  * No tourniquets in log *  DICTATION: .Dragon Dictation  PLAN OF CARE: Admit to inpatient   PATIENT DISPOSITION:  PACU - hemodynamically stable.   Delay start of Pharmacological VTE agent (>24hrs) due to surgical blood loss or risk of bleeding: not applicable Details of procedure:. Patient was taken to the operating room prepped and draped for abdominal procedure with Foley catheter in place timeout having been conducted. Ancef was administered 2 g. Transverse lower abdominal incision was made in the method of Pfannenstiel with sharp dissection to the fascia, transverse opening of the incision and careful entry of the peritoneal cavity. Alexis wound retractor was in position and bowel packed away. The uterus was enlarged, 12 week size and flexible. Ovaries were grossly normal as were tubes. Round ligaments were taken down clamped cut and suture ligated. The utero-ovarian ligaments were isolated clamped cut and suture ligated. Bladder flap was developed anteriorly. The uterus could be exteriorized through the incision. It was a large posterior lower uterine segment fibroid on the left side which caused the vessels on the left side to be very spread out. Double  clamping with curved Heaney clamps and Kelly clamps placed for backbleeding was performed. With vascular pedicle pedicles tied with 0 chromic on a transfixing suture. Upper cardinal ligaments were then clamped cut and suture ligated using straight Heaney clamp knife dissection and 0 chromic suture ligature. The uterus and lower uterine segment were amputated off of the cuff at this time and then the cuff oversewn with interrupted sutures of 0 chromic, incorporating the upper cardinal ligament pedicles into the closure hemostasis was excellent. Pelvis was irrigated. Pedicles were hemostatic. Bilateral salpingectomies were performed using clamp to cross-clamp across the mesosalpinx, with excision and pedicle ligature. Laparotomy equipment was removed anterior peritoneum closed with running 2-0 chromic, fascia closed with 0 Vicryls, subcutaneous tissue closed with interrupted 20 plain 3 sutures and then subcuticular 40 Vicryls used to close the skin. Sponge and needle counts were correct, EBL 100 cc  Of note is that the patient's blood pressures were initially elevated during the case dropping to normal range during case after induction of anesthesia. The patient did receive labetalol intravenously to address blood pressure issues at the onset of the surgical procedure.

## 2015-03-12 NOTE — Brief Op Note (Signed)
03/12/2015  11:27 AM  PATIENT:  Cheyenne Harris  49 y.o. female  PRE-OPERATIVE DIAGNOSIS:  FIBROID UTERUS  POST-OPERATIVE DIAGNOSIS:  FIBROID UTERUS  PROCEDURE:  Procedure(s): HYSTERECTOMY SUPRACERVICAL ABDOMINAL (N/A) BILATERAL SALPINGECTOMY (Bilateral)  SURGEON:  Surgeon(s) and Role:    * Jonnie Kind, MD - Primary  PHYSICIAN ASSISTANT:   ASSISTANTS: Forest Gleason, RNFA   ANESTHESIA:   local and general  EBL:  Total I/O In: 1700 [I.V.:1700] Out: 400 [Urine:300; Blood:100]  BLOOD ADMINISTERED:none  DRAINS: Urinary Catheter (Foley)   LOCAL MEDICATIONS USED:  MARCAINE    and Amount: 20 ml  SPECIMEN:  Source of Specimen:  uterus , fallopian tubes  DISPOSITION OF SPECIMEN:  PATHOLOGY  COUNTS:  YES  TOURNIQUET:  * No tourniquets in log *  DICTATION: .Dragon Dictation  PLAN OF CARE: Admit to inpatient   PATIENT DISPOSITION:  PACU - hemodynamically stable.   Delay start of Pharmacological VTE agent (>24hrs) due to surgical blood loss or risk of bleeding: not applicable

## 2015-03-12 NOTE — Interval H&P Note (Signed)
History and Physical Interval Note:  03/12/2015 7:26 AM  Cheyenne Harris  has presented today for surgery, with the diagnosis of FIBROID UTERUS  The various methods of treatment have been discussed with the patient and family. After consideration of risks, benefits and other options for treatment, the patient has consented to  Procedure(s): HYSTERECTOMY SUPRACERVICAL ABDOMINAL (N/A) BILATERAL SALPINGECTOMY (Bilateral) as a surgical intervention .  The patient's history has been reviewed, patient examined, no change in status, stable for surgery.  I have reviewed the patient's chart and labs.  Questions were answered to the patient's satisfaction.     Cheyenne Harris

## 2015-03-13 ENCOUNTER — Encounter (HOSPITAL_COMMUNITY): Payer: Self-pay | Admitting: Obstetrics and Gynecology

## 2015-03-13 LAB — CBC
HEMATOCRIT: 26.7 % — AB (ref 36.0–46.0)
Hemoglobin: 8.9 g/dL — ABNORMAL LOW (ref 12.0–15.0)
MCH: 29.6 pg (ref 26.0–34.0)
MCHC: 33.3 g/dL (ref 30.0–36.0)
MCV: 88.7 fL (ref 78.0–100.0)
PLATELETS: 160 10*3/uL (ref 150–400)
RBC: 3.01 MIL/uL — ABNORMAL LOW (ref 3.87–5.11)
RDW: 13.3 % (ref 11.5–15.5)
WBC: 9.7 10*3/uL (ref 4.0–10.5)

## 2015-03-13 LAB — BASIC METABOLIC PANEL
Anion gap: 5 (ref 5–15)
BUN: 18 mg/dL (ref 6–20)
CALCIUM: 8.7 mg/dL — AB (ref 8.9–10.3)
CHLORIDE: 105 mmol/L (ref 101–111)
CO2: 30 mmol/L (ref 22–32)
Creatinine, Ser: 0.92 mg/dL (ref 0.44–1.00)
GFR calc non Af Amer: >60 mL/min (ref >60)
GLUCOSE: 110 mg/dL — AB (ref 65–99)
Potassium: 4.6 mmol/L (ref 3.5–5.1)
Sodium: 140 mmol/L (ref 135–145)

## 2015-03-13 MED ORDER — HYDROMORPHONE HCL 1 MG/ML IJ SOLN
0.5000 mg | INTRAMUSCULAR | Status: DC | PRN
  Administered 2015-03-13: 0.5 mg via INTRAVENOUS
  Filled 2015-03-13: qty 1

## 2015-03-13 NOTE — Care Management Note (Signed)
Case Management Note  Patient Details  Name: Cheyenne Harris MRN: [id number] Date of Birth: April 19, 1966  Subjective/Objective:                  Pt admitted from home s/p abd hysterectomy. Pt lives with her husband and will return home at discharge. Pt is independent with ADL's.  Action/Plan: No CM needs noted.  Expected Discharge Date:  03/13/15               Expected Discharge Plan:  Home/Self Care  In-House Referral:  NA  Discharge planning Services  CM Consult  Post Acute Care Choice:  NA Choice offered to:  NA  DME Arranged:    DME Agency:     HH Arranged:    HH Agency:     Status of Service:  Completed, signed off  Medicare Important Message Given:    Date Medicare IM Given:    Medicare IM give by:    Date Additional Medicare IM Given:    Additional Medicare Important Message give by:     If discussed at [place] of Stay Meetings, dates discussed:    Additional Comments:  Joylene Draft, RN 03/13/2015, 11:53 AM

## 2015-03-13 NOTE — Progress Notes (Signed)
Dr. Glo Herring paged and made aware pt eating and drinking well.  Tolerating diet as ordered.  New order to saline lock IV.  Pt and husband also ambulated in hallway.  Pt tolerated well.  Pt ambulated approximately 300 feet without complaint.  Pt now sitting up in chair.  Denies any pain.  Will continue to monitor.

## 2015-03-13 NOTE — Anesthesia Postprocedure Evaluation (Signed)
  Anesthesia Post-op Note  Patient: Cheyenne Harris  Procedure(s) Performed: Procedure(s): HYSTERECTOMY SUPRACERVICAL ABDOMINAL (N/A) BILATERAL SALPINGECTOMY (Bilateral)  Patient Location: room 323  Anesthesia Type:General  Level of Consciousness: awake, alert , oriented and patient cooperative  Airway and Oxygen Therapy: Patient Spontanous Breathing  Post-op Pain: 3 /10, mild  Post-op Assessment: Post-op Vital signs reviewed, Patient's Cardiovascular Status Stable, Respiratory Function Stable, Patent Airway, No signs of Nausea or vomiting, Adequate PO intake and Pain level controlled              Post-op Vital Signs: Reviewed and stable  Last Vitals:  Filed Vitals:   03/13/15 0436  BP: 129/69  Pulse: 74  Temp: 36.9 C  Resp: 19    Complications: No apparent anesthesia complications

## 2015-03-13 NOTE — Addendum Note (Signed)
Addendum  created 03/13/15 1029 by Charmaine Downs, CRNA   Modules edited: Notes Section   Notes Section:  File: [id number]

## 2015-03-13 NOTE — Progress Notes (Signed)
UR chart review completed.  

## 2015-03-13 NOTE — Progress Notes (Signed)
Pt ambulated in hallway with standby assist from husband.  Tolerated well.  Ambulated approximately 250 feet.  Pt now sitting up in chair without any complaints.

## 2015-03-13 NOTE — Progress Notes (Signed)
1 Day Post-Op Procedure(s) (LRB): HYSTERECTOMY SUPRACERVICAL ABDOMINAL (N/A) BILATERAL SALPINGECTOMY (Bilateral)  Subjective: Patient reports incisional pain and tolerating PO.  The Foley catheter has not been removed urine output during the night was good. Patient remained stable with hemoglobin unchanged on serial exams since her hypotensive episode. The combination of bowel prep prior to surgery, beta blockers during the case and early ambulation likely resulted in the hypotensive episode. Hemoglobin drop is out of proportion to the surgical blood loss for the procedure so the possibility of some intra-abdominal oozing remains  .will recheck CBC in a.m.  Objective: I have reviewed patient's vital signs, intake and output and labs. Temp:  [97.3 F (36.3 C)-98.8 F (37.1 C)] 98.4 F (36.9 C) (06/08 0436) Pulse Rate:  [51-74] 74 (06/08 0436) Resp:  [11-20] 19 (06/08 0436) BP: (59-174)/(40-94) 129/69 mmHg (06/08 0436) SpO2:  [94 %-100 %] 100 % (06/08 0436) FiO2 (%):  [0 %] 0 % (06/07 1200)  Intake/Output Summary (Last 24 hours) at 03/13/15 0736 Last data filed at 03/13/15 0600  Gross per 24 hour  Intake 5613.75 ml  Output   1558 ml  Net 4055.75 ml   CBC Latest Ref Rng 03/13/2015 03/12/2015 03/12/2015  WBC 4.0 - 10.5 K/uL 9.7 13.2(H) 13.3(H)  Hemoglobin 12.0 - 15.0 g/dL 8.9(L) 10.4(L) 10.7(L)  Hematocrit 36.0 - 46.0 % 26.7(L) 31.4(L) 32.4(L)  Platelets 150 - 400 K/uL 160 180 179   BMP Latest Ref Rng 03/12/2015 03/07/2015 10/10/2014  Glucose 65 - 99 mg/dL 170(H) 89 82  BUN 6 - 20 mg/dL 16 15 13   Creatinine 0.44 - 1.00 mg/dL 1.06(H) 0.97 1.01  Sodium 135 - 145 mmol/L 140 138 139  Potassium 3.5 - 5.1 mmol/L 4.5 3.7 4.2  Chloride 101 - 111 mmol/L 105 107 102  CO2 22 - 32 mmol/L 26 26 26   Calcium 8.9 - 10.3 mg/dL 8.7(L) 9.2 9.5     General: alert, cooperative and no distress Resp: clear to auscultation bilaterally GI: soft, non-tender; bowel sounds normal; no masses,  no organomegaly and  incision: clean, dry and intact Vaginal Bleeding: none  Assessment: s/p Procedure(s): HYSTERECTOMY SUPRACERVICAL ABDOMINAL (N/A) BILATERAL SALPINGECTOMY (Bilateral): stable, progressing well, tolerating diet and anemia  Plan: Advance diet Encourage ambulation Advance to PO medication Discontinue IV fluids Keep for one more day recheck CBC in a.m.  LOS: 1 day    Cheyenne Harris V 03/13/2015, 7:34 AM

## 2015-03-14 LAB — CBC
HCT: 22.7 % — ABNORMAL LOW (ref 36.0–46.0)
Hemoglobin: 7.5 g/dL — ABNORMAL LOW (ref 12.0–15.0)
MCH: 29.5 pg (ref 26.0–34.0)
MCHC: 33 g/dL (ref 30.0–36.0)
MCV: 89.4 fL (ref 78.0–100.0)
Platelets: 134 10*3/uL — ABNORMAL LOW (ref 150–400)
RBC: 2.54 MIL/uL — ABNORMAL LOW (ref 3.87–5.11)
RDW: 13.6 % (ref 11.5–15.5)
WBC: 8.1 10*3/uL (ref 4.0–10.5)

## 2015-03-14 MED ORDER — TRAMADOL HCL 50 MG PO TABS: 50.0000 mg | ORAL_TABLET | Freq: Four times a day (QID) | ORAL | Status: DC | PRN

## 2015-03-14 MED ORDER — KETOROLAC TROMETHAMINE 10 MG PO TABS: 10.0000 mg | ORAL_TABLET | Freq: Four times a day (QID) | ORAL | Status: DC | PRN

## 2015-03-14 NOTE — Discharge Summary (Signed)
Physician Discharge Summary  Patient ID: BAO COREAS MRN: [id number] DOB/AGE: 06-24-1966 49 y.o.  Admit date: 03/12/2015 Discharge date: 03/14/2015  Admission Diagnoses: Uterine fibroids, symptomatic  Discharge Diagnoses: Same, also postoperative surgical anemia Active Problems:   S/P abdominal hysterectomy  status post bilateral salpingectomy  Discharged Condition: good  Hospital Course: This 49 year old female who had undergone Lupron suppression of the uterine cavity and shrinkage of the uterus making week sized 12 week size underwent  bilateral salpingectomy, supracervical hysterectomy on 03/12/2015. The patient had moderate hypertension upon induction of labor but normal pressures during the case itself. EBL at the surgical procedure was only 100 cc. Postoperatively the patient had a set of hypotension or strip to the bathroom 6 hours postprocedure with slightly decreased urinary output remained otherwise stable hemoglobins were checked serially overnight care and shows slight decreased hemoglobin compared to expected from 12.4 preprocedure 10 overnight. The following morning hemoglobin is 8.9. 0.4 hours later was 7.5. She remained stable throughout this process with this indicates probably a postsurgical intra-abdominal oozing process that has now considered resolved. The patient's incision looks fine she is ambulatory, tolerating regular diet and will be discharged home on iron and stool softener, Toradol and tramadol for pain management. Pathology shows benign endometrial cavity. Uterine weight was 377 g. Saline appeared clean dry and intact and bowel function was normal  Consults: None  Significant Diagnostic Studies: labs:  CBC Latest Ref Rng 03/14/2015 03/13/2015 03/12/2015  WBC 4.0 - 10.5 K/uL 8.1 9.7 13.2(H)  Hemoglobin 12.0 - 15.0 g/dL 7.5(L) 8.9(L) 10.4(L)  Hematocrit 36.0 - 46.0 % 22.7(L) 26.7(L) 31.4(L)  Platelets 150 - 400 K/uL 134(L) 160 180      Treatments: IV hydration  and surgery: Abdominal supracervical hysterectomy bilateral salpingectomy  Discharge Exam: Blood pressure 121/63, pulse 85, temperature 98.7 F (37.1 C), temperature source Oral, resp. rate 20, height 5\' 4"  (1.626 m), weight 152 lb (68.947 kg), last menstrual period 10/11/2014, SpO2 98 %. General appearance: alert, cooperative and no distress Head: Normocephalic, without obvious abnormality, atraumatic Resp: clear to auscultation bilaterally GI: soft, non-tender; bowel sounds normal; no masses,  no organomegaly and Incision clean dry and intact  Disposition: Final discharge disposition not confirmed  Discharge Instructions    Call MD for:  extreme fatigue    Complete by:  As directed      Call MD for:  persistant nausea and vomiting    Complete by:  As directed      Call MD for:  severe uncontrolled pain    Complete by:  As directed      Call MD for:  temperature >100.4    Complete by:  As directed      Diet - low sodium heart healthy    Complete by:  As directed      Discharge instructions    Complete by:  As directed   Please take an iron tablet, and take a stool softener daily 30 days. Expect mild fatigue with activity as the hemoglobin recovers     Discharge wound care:    Complete by:  As directed   Leave the Steri-Strips in place for 10 days, then the may be removed. Notify the office for any oozing or bleeding from the incision     Increase activity slowly    Complete by:  As directed             Medication List    STOP taking these medications  leuprolide 3.75 MG injection  Commonly known as:  LUPRON     megestrol 40 MG tablet  Commonly known as:  MEGACE     Norgestimate-Ethinyl Estradiol Triphasic 0.18/0.215/0.25 MG-25 MCG tab  Commonly known as:  ORTHO TRI-CYCLEN LO      TAKE these medications        ketorolac 10 MG tablet  Commonly known as:  TORADOL  Take 1 tablet (10 mg total) by mouth every 6 (six) hours as needed.     KRILL OIL PO  Take 1  capsule by mouth daily.     loratadine 10 MG tablet  Commonly known as:  CLARITIN  Take 10 mg by mouth daily.     multivitamin with minerals tablet  Take 1 tablet by mouth daily.     PROBIOTIC PO  Take 1 tablet by mouth daily.     psyllium 58.6 % powder  Commonly known as:  METAMUCIL  Take 1 packet by mouth as needed.     rosuvastatin 5 MG tablet  Commonly known as:  CRESTOR  Take 1 tablet (5 mg total) by mouth daily.     traMADol 50 MG tablet  Commonly known as:  ULTRAM  Take 1 tablet (50 mg total) by mouth every 6 (six) hours as needed for moderate pain or severe pain.         SignedJonnie Kind 03/14/2015, 10:01 AM

## 2015-03-14 NOTE — Progress Notes (Signed)
2 Days Post-Op Procedure(s) (LRB): HYSTERECTOMY SUPRACERVICAL ABDOMINAL (N/A) BILATERAL SALPINGECTOMY (Bilateral)  Subjective: Patient reports tolerating PO, + flatus and no problems voiding.  She has used minimal pain medicines while in the hospital  Objective: I have reviewed patient's vital signs, intake and output, medications, labs and pathology. CBC Latest Ref Rng 03/14/2015 03/13/2015 03/12/2015  WBC 4.0 - 10.5 K/uL 8.1 9.7 13.2(H)  Hemoglobin 12.0 - 15.0 g/dL 7.5(L) 8.9(L) 10.4(L)  Hematocrit 36.0 - 46.0 % 22.7(L) 26.7(L) 31.4(L)  Platelets 150 - 400 K/uL 134(L) 160 180   Pathology shows a 377 g normal uterine tissues, with fibroids. No precancerous or cancerous tissues. Fallopian tubes are normal Hemoglobin is lower than anticipated suggesting postoperative bleeding. This is explained by the initial postoperative low blood pressures.. I think she is equilibrated at this point as well hydrated well perfused with good skin color. She is asymptomatic. Bowel function is fine. I think there was some surgical postoperative blood loss into the abdominal cavity which will be reabsorbed by the body. The patient was aware of the need to take an iron tablet, stool softener and expects: Slow recovery and resolution of the suspected intra-abdominal bleeding now resolved  General: alert, cooperative and no distress Resp: clear to auscultation bilaterally GI: soft, non-tender; bowel sounds normal; no masses,  no organomegaly and incision: clean, dry and intact Vaginal Bleeding: none  Assessment: s/p Procedure(s): HYSTERECTOMY SUPRACERVICAL ABDOMINAL (N/A) BILATERAL SALPINGECTOMY (Bilateral): stable and anemia, acute postsurgical  Plan: Advance diet Discharge home  LOS: 2 days    Cheyenne Harris V 03/14/2015, 9:54 AM

## 2015-03-14 NOTE — Progress Notes (Signed)
Pt. Given medication and discharge instructions. Pt. Stated she had no further questions. Pt. Stable at discharge. Escorted to main entrance via W/C by Chartered certified accountant.

## 2015-03-14 NOTE — Care Management Note (Signed)
Case Management Note  Patient Details  Name: Cheyenne Harris MRN: [id number] Date of Birth: 1966-09-12  Subjective/Objective:                    Action/Plan:   Expected Discharge Date:  03/13/15               Expected Discharge Plan:  Home/Self Care  In-House Referral:  NA  Discharge planning Services  CM Consult  Post Acute Care Choice:  NA Choice offered to:  NA  DME Arranged:    DME Agency:     HH Arranged:    [place] Agency:     Status of Service:  Completed, signed off  Medicare Important Message Given:    Date Medicare IM Given:    Medicare IM give by:    Date Additional Medicare IM Given:    Additional Medicare Important Message give by:     If discussed at [place] of Stay Meetings, dates discussed:    Additional Comments: Pt discharged home today. No CM needs noted. Cheyenne Gully [place], RN 03/14/2015, 10:12 AM

## 2015-03-14 NOTE — Discharge Instructions (Signed)
Abdominal Hysterectomy, Care After Refer to this sheet in the next few weeks. These instructions provide you with information on caring for yourself after your procedure. Your health care provider may also give you more specific instructions. Your treatment has been planned according to current medical practices, but problems sometimes occur. Call your health care provider if you have any problems or questions after your procedure.  WHAT TO EXPECT AFTER THE PROCEDURE After your procedure, it is typical to have the following:  Pain.  Feeling tired.  Poor appetite.  Less interest in sex. HOME CARE INSTRUCTIONS  It takes 4-6 weeks to recover from this surgery. Make sure you follow all your health care provider's instructions. Home care instructions may include:  Take pain medicines only as directed by your health care provider. Do not take over-the-counter pain medicines without checking with your health care provider first.  Change your bandage as directed by your health care provider.  Return to your health care provider to have your sutures taken out.  Take showers instead of baths for 2-3 weeks. Ask your health care provider when it is safe to start showering.  Do not douche, use tampons, or have sexual intercourse for at least 6 weeks or until your health care provider says you can.   Follow your health care provider's advice about exercise, lifting, driving, and general activities.  Get plenty of rest and sleep.   Do not lift anything heavier than a gallon of milk (about 10 lb [4.5 kg]) for the first month after surgery.  You can resume your normal diet if your health care provider says it is okay.   Do not drink alcohol until your health care provider says you can.   If you are constipated, ask your health care provider if you can take a mild laxative.  Eating foods high in fiber may also help with constipation. Eat plenty of raw fruits and vegetables, whole grains, and  beans.  Drink enough fluids to keep your urine clear or pale yellow.   Try to have someone at home with you for the first 1-2 weeks to help around the house.  Keep all follow-up appointments. SEEK MEDICAL CARE IF:   You have chills or fever.  You have swelling, redness, or pain in the area of your incision that is getting worse.   You have pus coming from the incision.   You notice a bad smell coming from the incision or bandage.   Your incision breaks open.   You feel dizzy or light-headed.   You have pain or bleeding when you urinate.   You have persistent diarrhea.   You have persistent nausea and vomiting.   You have abnormal vaginal discharge.   You have a rash.   You have any type of abnormal reaction or develop an allergy to your medicine.   Your pain medicine is not helping.  SEEK IMMEDIATE MEDICAL CARE IF:   You have a fever and your symptoms suddenly get worse.  You have severe abdominal pain.  You have chest pain.  You have shortness of breath.  You faint.  You have pain, swelling, or redness of your leg.  You have heavy vaginal bleeding with blood clots. MAKE SURE YOU:  Understand these instructions.  Will watch your condition.  Will get help right away if you are not doing well or get worse. Document Released: 04/10/2005 Document Revised: 09/26/2013 Document Reviewed: 07/14/2013 Exit[employer] Patient Information 2015 [employer]. This information is not intended   to replace advice given to you by your health care provider. Make sure you discuss any questions you have with your health care provider.  

## 2015-03-16 LAB — TYPE AND SCREEN
ABO/RH(D): O POS
ANTIBODY SCREEN: NEGATIVE
UNIT DIVISION: 0
UNIT DIVISION: 0
Unit division: 0

## 2015-03-18 ENCOUNTER — Telehealth: Payer: Self-pay | Admitting: Obstetrics and Gynecology

## 2015-03-18 ENCOUNTER — Ambulatory Visit (HOSPITAL_COMMUNITY): Payer: 59

## 2015-03-18 ENCOUNTER — Encounter: Payer: Self-pay | Admitting: Obstetrics and Gynecology

## 2015-03-18 ENCOUNTER — Ambulatory Visit (INDEPENDENT_AMBULATORY_CARE_PROVIDER_SITE_OTHER): Payer: 59 | Admitting: Obstetrics and Gynecology

## 2015-03-18 VITALS — BP 148/76 | HR 88 | Temp 98.8°F | Wt 147.2 lb

## 2015-03-18 DIAGNOSIS — R5082 Postprocedural fever: Secondary | ICD-10-CM

## 2015-03-18 DIAGNOSIS — R197 Diarrhea, unspecified: Secondary | ICD-10-CM | POA: Diagnosis not present

## 2015-03-18 MED ORDER — DOXYCYCLINE HYCLATE 100 MG PO CAPS: 100.0000 mg | ORAL_CAPSULE | Freq: Two times a day (BID) | ORAL | Status: DC

## 2015-03-18 NOTE — Progress Notes (Signed)
Patient ID: Cheyenne Harris, female   DOB: [birth year], 49 y.o.   MRN: [id number]   workin appt for fever to 101  Subjective:  Cheyenne Harris is a 49 y.o. female now 6 days status post Supracervicalsupracervical hysterectomy.complicated by postop anemia greater than expected, raising the ? Of intraabdominal oozing postop.    Pt reports fever of 101 and intermittent diarrhea that started last night. She notes mild congestion that she has treated with Sudafed. Pt has had normal BM since procedure. She denies pain, drainage or redness at the incision site, constipation, nausea and vomiting.    Review of Systems Negative except fever r diet:   yes   Bowel movements : normal.  Pain is controlled with current analgesics. Medications being used: acetaminophen.  Objective:  BP 148/76 mmHg   Pulse 88   Temp(Src) 98.8 F (37.1 C) (Oral)   Wt 147 lb 3.2 oz (66.769 kg)   LMP 10/11/2014 General:Well developed, well nourished.  No acute distress. Abdomen: Bowel sounds watery, soft, non-tender. Incision(s):   Healing well, no drainage, no erythema, no hernia, no swelling, no dehiscence,     Assessment:  Post-Op 1 weeks s/p total abdominal hysterectomy    Doing well postoperatively, but reports a fever of 101.   may be related to blood reabsorption Plan:  1.Wound care discussed   wwill check CBC esr,  2. . current medications.to continue.cover with doxy x 5 days if esr elevated or increased wbc. 3. Activity restrictions: none 4. return to work: not applicable. 5. Follow up in 1 week.   This chart was scribed for Jonnie Kind, MD by Tula Nakayama, ED Scribe. This patient was seen in room 3 and the patient's care was started at 3:08 PM.   I personally performed the services described in this documentation, which was SCRIBED in my presence. The recorded information has been reviewed and considered accurate. It has been edited as necessary during review. Jonnie Kind, MD

## 2015-03-18 NOTE — Telephone Encounter (Signed)
Pt states had hysterectomy last week, last night started running a fever of 101 and now has diarrhea, she thinks she picked up a "bug" in the hospital.  Pt states incision site not red or draining and only lite amount of pain, wants to know if she needs to be seen.  Please advise.

## 2015-03-18 NOTE — Telephone Encounter (Signed)
Pt husband, Laquanta Hummel, states pt had hysterectomy on 03/12/2015 c/o fever of 101 since last night, not relieved with tylenol, heat all over, no drainage or swelling at incision site. Informed to bring pt in now for evaluation with Dr. Glo Herring. Pt husband, Wilber Oliphant, verbalized understanding.

## 2015-03-19 LAB — CBC WITH DIFFERENTIAL/PLATELET
Basophils Absolute: 0 10*3/uL (ref 0.0–0.2)
Basos: 0 %
EOS (ABSOLUTE): 0.1 10*3/uL (ref 0.0–0.4)
Eos: 1 %
HEMATOCRIT: 30.7 % — AB (ref 34.0–46.6)
Hemoglobin: 10.4 g/dL — ABNORMAL LOW (ref 11.1–15.9)
Immature Grans (Abs): 0 10*3/uL (ref 0.0–0.1)
Immature Granulocytes: 0 %
LYMPHS ABS: 2.3 10*3/uL (ref 0.7–3.1)
Lymphs: 20 %
MCH: 29.6 pg (ref 26.6–33.0)
MCHC: 33.9 g/dL (ref 31.5–35.7)
MCV: 88 fL (ref 79–97)
Monocytes Absolute: 0.9 10*3/uL (ref 0.1–0.9)
Monocytes: 8 %
NEUTROS ABS: 8 10*3/uL — AB (ref 1.4–7.0)
Neutrophils: 71 %
PLATELETS: 283 10*3/uL (ref 150–379)
RBC: 3.51 x10E6/uL — ABNORMAL LOW (ref 3.77–5.28)
RDW: 13.9 % (ref 12.3–15.4)
WBC: 11.3 10*3/uL — ABNORMAL HIGH (ref 3.4–10.8)

## 2015-03-19 LAB — SEDIMENTATION RATE: SED RATE: 35 mm/h — AB (ref 0–32)

## 2015-03-20 ENCOUNTER — Encounter: Payer: 59 | Admitting: Obstetrics and Gynecology

## 2015-03-25 ENCOUNTER — Telehealth: Payer: Self-pay | Admitting: Obstetrics and Gynecology

## 2015-03-25 NOTE — Telephone Encounter (Signed)
Husband reports pt is doing well

## 2015-03-25 NOTE — Telephone Encounter (Signed)
Patient seen, see note this date.

## 2015-03-27 ENCOUNTER — Ambulatory Visit (INDEPENDENT_AMBULATORY_CARE_PROVIDER_SITE_OTHER): Payer: 59 | Admitting: Obstetrics and Gynecology

## 2015-03-27 ENCOUNTER — Encounter: Payer: Self-pay | Admitting: Obstetrics and Gynecology

## 2015-03-27 VITALS — BP 120/76 | Ht 64.0 in | Wt 142.5 lb

## 2015-03-27 DIAGNOSIS — Z9889 Other specified postprocedural states: Secondary | ICD-10-CM

## 2015-03-27 DIAGNOSIS — Z90710 Acquired absence of both cervix and uterus: Secondary | ICD-10-CM

## 2015-03-27 NOTE — Progress Notes (Signed)
Patient ID: Cheyenne Harris, female   DOB: 01/15/1966, 49 y.o.   MRN: [id number]  Subjective:  Cheyenne Harris is a 49 y.o. female now 2 weeks status post supracervical abdominal hysterectomy with bilateral salpingectomy. Patient reports she has no complaints, although she has gas pains after eating.  Patient works in [employer], which involves walking.    Review of Systems Negative except as noted above r diet:   normal   Bowel movements : pt reports some contipation, and has to take a daily laxative.  Pain is controlled without any medications.  Objective:  BP 120/76 mmHg  Ht 5\' 4"  (1.626 m)  Wt 142 lb 8 oz (64.638 kg)  BMI 24.45 kg/m2 General:Well developed, well nourished.  No acute distress. Abdomen: Bowel sounds normal, soft, non-tender.  Incision(s):   Healing well, no drainage, no erythema, no hernia, no swelling, no dehiscence,   Assessment:  Post-Op 2 weeks s/p supracervical abdominal hysterectomy with bilateral salpingectomy.   Doing well postoperatively. Mild change in bowel function, will monitor   Plan:  1.Wound care discussed   2. . current medications. n/a 3. Activity restrictions: restricted home exercise i.e. avoid abdominal exercises and ease back into running 4. return to work: 2-3 weeks. 5. Follow up in 2 weeks for release to RTW and judge exercise levels     This chart was SCRIBED for Mallory Shirk, MD by Stephania Fragmin, ED Scribe. This patient was seen in room 1 and the patient's care was started at 9:03 AM.  I personally performed the services described in this documentation, which was SCRIBED in my presence. The recorded information has been reviewed and considered accurate. It has been edited as necessary during review. Jonnie Kind, MD

## 2015-03-27 NOTE — Progress Notes (Signed)
Patient ID: Cheyenne Harris, female   DOB: 1966/04/24, 49 y.o.   MRN: [id number] Pt here today for post op visit. Pt states that everything is going ok but she has really bad gas pains after she eats, and it doesn't matter what she eats.

## 2015-04-01 DIAGNOSIS — Z029 Encounter for administrative examinations, unspecified: Secondary | ICD-10-CM

## 2015-04-10 ENCOUNTER — Encounter: Payer: Self-pay | Admitting: Obstetrics and Gynecology

## 2015-04-10 ENCOUNTER — Ambulatory Visit (INDEPENDENT_AMBULATORY_CARE_PROVIDER_SITE_OTHER): Payer: 59 | Admitting: Obstetrics and Gynecology

## 2015-04-10 VITALS — BP 130/76 | Ht 64.0 in

## 2015-04-10 DIAGNOSIS — N9489 Other specified conditions associated with female genital organs and menstrual cycle: Secondary | ICD-10-CM

## 2015-04-10 DIAGNOSIS — Z9889 Other specified postprocedural states: Secondary | ICD-10-CM

## 2015-04-10 NOTE — Progress Notes (Signed)
Patient ID: Cheyenne Harris, female   DOB: 19-May-1966, 49 y.o.   MRN: [id number] Pt here today for final post op. Pt states that she still has some soreness and energy level is still not back to normal but no major complaints.

## 2015-04-10 NOTE — Progress Notes (Signed)
  Subjective:  Cheyenne Harris is a 49 y.o. female now 4 weeks status post HYSTERECTOMY SUPRACERVICAL ABDOMINAL (N/A) BILATERAL SALPINGECTOMY (Bilateral)  Review of Systems Negative except abdominal pain present only with cough or sneezing diet:   regular   Bowel movements : normal.  The patient is not having any constant pain.  She does have 2-3 mins of pelvic and lower abd discomfort with cough or sneeze  Objective:  BP 130/76 mmHg  Ht $R'5\' 4"'UK$  (1.626 m)  LMP 10/11/2014 General:Well developed, well nourished.  No acute distress. Abdomen: Bowel sounds normal, soft, non-tender. Pelvic Exam:    External Genitalia:  Normal.    Vagina: Normal    Cervix: Thickness above the cervix    Uterus: surgically absent but a tender fullness noted in the cul de sac, could be ovary cyst or hematoma    Adnexa/Bimanual: fullness in cul de sac. Moderate dicomfort  Incision(s):   Healing well, no drainage, no erythema, no hernia, no swelling, no dehiscence,    Assessment:  Post-Op 4 weeks s/p HYSTERECTOMY SUPRACERVICAL ABDOMINAL (N/A) BILATERAL SALPINGECTOMY (Bilateral)  Cuff thickness and discomfort. R/o residual pelvic hematoma  Will order CT abd / Pelvis.Lollie Sails esr  bmet   Plan:  1.Wound care discussed   2. . current medications.none 3. Activity restrictions: not ready to return to work 4. return to work: not applicable. 5. Follow up in 1 week.

## 2015-04-11 ENCOUNTER — Other Ambulatory Visit: Payer: Self-pay | Admitting: Obstetrics and Gynecology

## 2015-04-11 DIAGNOSIS — R102 Pelvic and perineal pain: Secondary | ICD-10-CM

## 2015-04-11 LAB — SEDIMENTATION RATE: SED RATE: 20 mm/h (ref 0–32)

## 2015-04-11 LAB — CBC WITH DIFFERENTIAL/PLATELET
Basophils Absolute: 0 10*3/uL (ref 0.0–0.2)
Basos: 1 %
EOS (ABSOLUTE): 0.1 10*3/uL (ref 0.0–0.4)
Eos: 1 %
HEMOGLOBIN: 12.7 g/dL (ref 11.1–15.9)
Hematocrit: 38.9 % (ref 34.0–46.6)
IMMATURE GRANS (ABS): 0 10*3/uL (ref 0.0–0.1)
IMMATURE GRANULOCYTES: 0 %
LYMPHS ABS: 2 10*3/uL (ref 0.7–3.1)
Lymphs: 32 %
MCH: 28.2 pg (ref 26.6–33.0)
MCHC: 32.6 g/dL (ref 31.5–35.7)
MCV: 86 fL (ref 79–97)
MONOS ABS: 0.3 10*3/uL (ref 0.1–0.9)
Monocytes: 6 %
Neutrophils Absolute: 3.7 10*3/uL (ref 1.4–7.0)
Neutrophils: 60 %
PLATELETS: 268 10*3/uL (ref 150–379)
RBC: 4.51 x10E6/uL (ref 3.77–5.28)
RDW: 13.4 % (ref 12.3–15.4)
WBC: 6.2 10*3/uL (ref 3.4–10.8)

## 2015-04-11 LAB — BASIC METABOLIC PANEL
BUN / CREAT RATIO: 11 (ref 9–23)
BUN: 9 mg/dL (ref 6–24)
CHLORIDE: 101 mmol/L (ref 97–108)
CO2: 27 mmol/L (ref 18–29)
Calcium: 9.8 mg/dL (ref 8.7–10.2)
Creatinine, Ser: 0.8 mg/dL (ref 0.57–1.00)
GFR calc non Af Amer: 87 mL/min/{1.73_m2} (ref >59)
GFR, EST AFRICAN AMERICAN: 101 mL/min/{1.73_m2} (ref >59)
Glucose: 79 mg/dL (ref 65–99)
Potassium: 4.3 mmol/L (ref 3.5–5.2)
Sodium: 142 mmol/L (ref 134–144)

## 2015-04-12 ENCOUNTER — Ambulatory Visit (HOSPITAL_COMMUNITY): Payer: 59

## 2015-04-15 ENCOUNTER — Ambulatory Visit (INDEPENDENT_AMBULATORY_CARE_PROVIDER_SITE_OTHER): Payer: 59

## 2015-04-15 DIAGNOSIS — R102 Pelvic and perineal pain: Secondary | ICD-10-CM | POA: Diagnosis not present

## 2015-04-15 NOTE — Progress Notes (Signed)
PELVIC US TV/TA :cervical stump wnl,4.8 x 2.8 x 3.6 cm complex mult.loculated collection between ov's,unable to sperate collection form ov's w/pressure,pain on lt and midline during ultrasound,ov's appear to be normal,no free fluid,Dr. Glo Herring scanned and review ultrasound w/pt

## 2015-04-19 ENCOUNTER — Encounter: Payer: 59 | Admitting: Obstetrics and Gynecology

## 2015-04-29 ENCOUNTER — Encounter: Payer: Self-pay | Admitting: Obstetrics and Gynecology

## 2015-04-29 ENCOUNTER — Ambulatory Visit (INDEPENDENT_AMBULATORY_CARE_PROVIDER_SITE_OTHER): Payer: 59 | Admitting: Obstetrics and Gynecology

## 2015-04-29 VITALS — BP 170/100 | Ht 64.0 in | Wt 150.0 lb

## 2015-04-29 DIAGNOSIS — Z9889 Other specified postprocedural states: Secondary | ICD-10-CM

## 2015-04-29 DIAGNOSIS — Z09 Encounter for follow-up examination after completed treatment for conditions other than malignant neoplasm: Secondary | ICD-10-CM

## 2015-04-29 NOTE — Progress Notes (Signed)
Patient ID: Cheyenne Harris, female   DOB: 07/06/1966, 49 y.o.   MRN: [id number]   Subjective:  Cheyenne Harris is a 49 y.o. female now 8 weeks status post hysterectomy supracervical abdominal and bilateral salpingectomy. She reports decreased pelvic pain compared to her last visit on 7/6. Pt also notes intermittent hot flashes. She had [place] on 7/11 which showed persisting hematoma that was not attached to bowel.   Review of Systems Negative except    Diet:   Normal   Bowel movements : normal.  Pain is controlled without any medications.  Objective:  BP 170/100 mmHg  Ht 5\' 4"  (1.626 m)  Wt 150 lb (68.04 kg)  BMI 25.73 kg/m2  LMP 10/11/2014 General:Well developed, well nourished.  No acute distress. Abdomen: Bowel sounds normal, soft, non-tender. Pelvic Exam:    External Genitalia:  Normal.    Vagina: Normal, good muscle support    Cervix: Normal, normal secretions; some tenderness and stiffness noted in the cul de sac, worse on the right side, but decreased compared to visit on 7/6    Uterus: Surgically absent, cervix present, mobile    Adnexa/Bimanual: left side flexible, right side slight increased firmness, no masses, cul de sac negative.    Bladder: Well-supported  Incision(s):   Healing well, no drainage, no erythema, no hernia, no swelling, no dehiscence  Assessment:  Post-Op 8 weeks s/p hysterectomy supracervical abdominal and bilateral salpingectomy.     Doing well postoperatively.   Plan:  1.Wound care discussed. 2. . current medications. 3. Activity restrictions: none. Discussed precautions with resuming sexual activity. 4. return to work: now. 25 lb limit for 2 weeks  5. Follow up in 6 weeks for pt reassurance, for eval of Hot flashes.; pt to call in 2 weeks for hormone patch for hot flashes, if needed  This chart was scribed for Jonnie Kind, MD by Tula Nakayama, ED Scribe. This patient was seen in room 1 and the patient's care was started at 9:29 AM.   I  personally performed the services described in this documentation, which was SCRIBED in my presence. The recorded information has been reviewed and considered accurate. It has been edited as necessary during review. Jonnie Kind, MD

## 2015-04-29 NOTE — Progress Notes (Signed)
Patient ID: Cheyenne Harris, female   DOB: 1966-07-29, 49 y.o.   MRN: [id number] Pt here today for her final post op visit. Pt denies any problems or concerns at this time.

## 2015-05-06 ENCOUNTER — Telehealth: Payer: Self-pay | Admitting: *Deleted

## 2015-05-06 ENCOUNTER — Other Ambulatory Visit: Payer: Self-pay | Admitting: Obstetrics and Gynecology

## 2015-05-06 DIAGNOSIS — R55 Syncope and collapse: Secondary | ICD-10-CM

## 2015-05-06 MED ORDER — ESTRADIOL 0.1 MG/24HR TD PTTW: 1.0000 | MEDICATED_PATCH | TRANSDERMAL | Status: DC

## 2015-05-06 NOTE — Telephone Encounter (Signed)
rx vivelle dot escribed to pt pharmacy of record.

## 2015-05-06 NOTE — Telephone Encounter (Signed)
Pt states that she was told to call back if she needed an estrogen patch. Pt wants medication to be sent to [facility] [address]. Pt advised that I would send this message to Dr. Glo Herring and to check with her pharmacy later this afternoon, and if Rx was not there to call us back first thing in the morning. Pt verbalized understanding.

## 2015-05-21 ENCOUNTER — Other Ambulatory Visit: Payer: Self-pay | Admitting: *Deleted

## 2015-05-21 ENCOUNTER — Telehealth: Payer: Self-pay | Admitting: *Deleted

## 2015-05-21 MED ORDER — ESTRADIOL 0.1 MG/24HR TD PTTW: 1.0000 | MEDICATED_PATCH | TRANSDERMAL | Status: DC

## 2015-05-21 NOTE — Telephone Encounter (Signed)
Pt's husband called and informed me that his wife's medication still has not been sent to the pharmacy. I advised him that I would look into the problem and as soon as Dr. Glo Herring came in this afternoon I would have him take care of it. I apologized multiple times and informed him that I thought Dr. Glo Herring had taken care of it.  He verbalized understanding.  After looking over the pt's chart I found that the medication was actually Rx'd but it printed instead of being e-scribed. I went ahead and sent the Rx to her pharmacy.

## 2015-05-21 NOTE — Progress Notes (Signed)
Pt 's husband called wanting to know why his wife's medication had not been sent to the pharmacy yet when his wife was told that it would be taken care of. I apologized repeatedly to the pt's husband and informed him that I would find out what happened and why the medication was not sent in. I informed him that I would call him back as soon as I knew something. He verbalized understanding.   When looking to the above matter I found that the Rx was prescribed on the 1st but it printed out instead of e-scribing to the pharmacy. I went ahead and reordered the medication and [facility] [place]. I will contact the pt and her husband and let them know.

## 2015-05-21 NOTE — Telephone Encounter (Signed)
Pt and husband aware that Rx was re-sent to the pharmacy. I apologized to bothe the pt and her husband and explained what happened and why the Rx never made it to the pharmacy.

## 2015-05-23 ENCOUNTER — Ambulatory Visit (INDEPENDENT_AMBULATORY_CARE_PROVIDER_SITE_OTHER): Payer: 59 | Admitting: Family Medicine

## 2015-05-23 VITALS — BP 144/90 | HR 78 | Resp 18 | Ht 64.0 in | Wt 153.0 lb

## 2015-05-23 DIAGNOSIS — E785 Hyperlipidemia, unspecified: Secondary | ICD-10-CM

## 2015-05-23 DIAGNOSIS — I10 Essential (primary) hypertension: Secondary | ICD-10-CM | POA: Insufficient documentation

## 2015-05-23 DIAGNOSIS — N39498 Other specified urinary incontinence: Secondary | ICD-10-CM | POA: Diagnosis not present

## 2015-05-23 DIAGNOSIS — E663 Overweight: Secondary | ICD-10-CM

## 2015-05-23 DIAGNOSIS — Z90710 Acquired absence of both cervix and uterus: Secondary | ICD-10-CM

## 2015-05-23 MED ORDER — AMLODIPINE BESYLATE 2.5 MG PO TABS: 2.5000 mg | ORAL_TABLET | Freq: Every day | ORAL | Status: DC

## 2015-05-23 NOTE — Patient Instructions (Signed)
Nurse BP check in 4 weeks with flu vacine Keep MD appt in October as before  New for blood pressure is amlodipine 2.5 mg one at bedtime  Need to commit to healthy lifestyle as you have in the past  Fasting lipid, cmp and EGFr for October visit   DASH Eating Plan DASH stands for "Dietary Approaches to Stop Hypertension." The DASH eating plan is a healthy eating plan that has been shown to reduce high blood pressure (hypertension). Additional health benefits may include reducing the risk of type 2 diabetes mellitus, heart disease, and stroke. The DASH eating plan may also help with weight loss. WHAT DO I NEED TO KNOW ABOUT THE DASH EATING PLAN? For the DASH eating plan, you will follow these general guidelines:  Choose foods with a percent daily value for sodium of less than 5% (as listed on the food label).  Use salt-free seasonings or herbs instead of table salt or sea salt.  Check with your health care provider or pharmacist before using salt substitutes.  Eat lower-sodium products, often labeled as "lower sodium" or "no salt added."  Eat fresh foods.  Eat more vegetables, fruits, and low-fat dairy products.  Choose whole grains. Look for the word "whole" as the first word in the ingredient list.  Choose fish and skinless chicken or [place] more often than red meat. Limit fish, poultry, and meat to 6 oz (170 g) each day.  Limit sweets, desserts, sugars, and sugary drinks.  Choose heart-healthy fats.  Limit cheese to 1 oz (28 g) per day.  Eat more home-cooked food and less restaurant, buffet, and fast food.  Limit fried foods.  Cook foods using methods other than frying.  Limit canned vegetables. If you do use them, rinse them well to decrease the sodium.  When eating at a restaurant, ask that your food be prepared with less salt, or no salt if possible. WHAT FOODS CAN I EAT? Seek help from a dietitian for individual calorie needs. Grains Whole grain or whole wheat  bread. Brown rice. Whole grain or whole wheat pasta. Quinoa, bulgur, and whole grain cereals. Low-sodium cereals. Corn or whole wheat flour tortillas. Whole grain cornbread. Whole grain crackers. Low-sodium crackers. Vegetables Fresh or frozen vegetables (raw, steamed, roasted, or grilled). Low-sodium or reduced-sodium tomato and vegetable juices. Low-sodium or reduced-sodium tomato sauce and paste. Low-sodium or reduced-sodium canned vegetables.  Fruits All fresh, canned (in natural juice), or frozen fruits. Meat and Other Protein Products Ground beef (85% or leaner), grass-fed beef, or beef trimmed of fat. Skinless chicken or [place]. Ground chicken or [place]. Pork trimmed of fat. All fish and seafood. Eggs. Dried beans, peas, or lentils. Unsalted nuts and seeds. Unsalted canned beans. Dairy Low-fat dairy products, such as skim or 1% milk, 2% or reduced-fat cheeses, low-fat ricotta or cottage cheese, or plain low-fat yogurt. Low-sodium or reduced-sodium cheeses. Fats and Oils Tub margarines without trans fats. Light or reduced-fat mayonnaise and salad dressings (reduced sodium). Avocado. Safflower, olive, or canola oils. Natural peanut or almond butter. Other Unsalted popcorn and pretzels. The items listed above may not be a complete list of recommended foods or beverages. Contact your dietitian for more options. WHAT FOODS ARE NOT RECOMMENDED? Grains White bread. White pasta. White rice. Refined cornbread. Bagels and croissants. Crackers that contain trans fat. Vegetables Creamed or fried vegetables. Vegetables in a cheese sauce. Regular canned vegetables. Regular canned tomato sauce and paste. Regular tomato and vegetable juices. Fruits Dried fruits. Canned fruit in light or  heavy syrup. Fruit juice. Meat and Other Protein Products Fatty cuts of meat. Ribs, chicken wings, bacon, sausage, bologna, salami, chitterlings, fatback, hot dogs, bratwurst, and packaged luncheon meats. Salted nuts and  seeds. Canned beans with salt. Dairy Whole or 2% milk, cream, half-and-half, and cream cheese. Whole-fat or sweetened yogurt. Full-fat cheeses or blue cheese. Nondairy creamers and whipped toppings. Processed cheese, cheese spreads, or cheese curds. Condiments Onion and garlic salt, seasoned salt, table salt, and sea salt. Canned and packaged gravies. Worcestershire sauce. Tartar sauce. Barbecue sauce. Teriyaki sauce. Soy sauce, including reduced sodium. Steak sauce. Fish sauce. Oyster sauce. Cocktail sauce. Horseradish. Ketchup and mustard. Meat flavorings and tenderizers. Bouillon cubes. Hot sauce. Tabasco sauce. Marinades. Taco seasonings. Relishes. Fats and Oils Butter, stick margarine, lard, shortening, ghee, and bacon fat. Coconut, palm kernel, or palm oils. Regular salad dressings. Other Pickles and olives. Salted popcorn and pretzels. The items listed above may not be a complete list of foods and beverages to avoid. Contact your dietitian for more information. WHERE CAN I FIND MORE INFORMATION? National Heart, Lung, and Blood Institute: travelstabloid.com Document Released: 09/10/2011 Document Revised: 02/05/2014 Document Reviewed: 07/26/2013 [facility] Patient Information 2015 [place], [place]. This information is not intended to replace advice given to you by your health care provider. Make sure you discuss any questions you have with your health care provider. Hypertension Hypertension, commonly called high blood pressure, is when the force of blood pumping through your arteries is too strong. Your arteries are the blood vessels that carry blood from your heart throughout your body. A blood pressure reading consists of a higher number over a lower number, such as 110/72. The higher number (systolic) is the pressure inside your arteries when your heart pumps. The lower number (diastolic) is the pressure inside your arteries when your heart relaxes. Ideally you  want your blood pressure below 120/80. Hypertension forces your heart to work harder to pump blood. Your arteries may become narrow or stiff. Having hypertension puts you at risk for heart disease, stroke, and other problems.  RISK FACTORS Some risk factors for high blood pressure are controllable. Others are not.  Risk factors you cannot control include:   Race. You may be at higher risk if you are African American.  Age. Risk increases with age.  Gender. Men are at higher risk than women before age 25 years. After age 27, women are at higher risk than men. Risk factors you can control include:  Not getting enough exercise or physical activity.  Being overweight.  Getting too much fat, sugar, calories, or salt in your diet.  Drinking too much alcohol. SIGNS AND SYMPTOMS Hypertension does not usually cause signs or symptoms. Extremely high blood pressure (hypertensive crisis) may cause headache, anxiety, shortness of breath, and nosebleed. DIAGNOSIS  To check if you have hypertension, your health care provider will measure your blood pressure while you are seated, with your arm held at the level of your heart. It should be measured at least twice using the same arm. Certain conditions can cause a difference in blood pressure between your right and left arms. A blood pressure reading that is higher than normal on one occasion does not mean that you need treatment. If one blood pressure reading is high, ask your health care provider about having it checked again. TREATMENT  Treating high blood pressure includes making lifestyle changes and possibly taking medicine. Living a healthy lifestyle can help lower high blood pressure. You may need to change some of your  habits. Lifestyle changes may include:  Following the DASH diet. This diet is high in fruits, vegetables, and whole grains. It is low in salt, red meat, and added sugars.  Getting at least 2 hours of brisk physical activity every  week.  Losing weight if necessary.  Not smoking.  Limiting alcoholic beverages.  Learning ways to reduce stress. If lifestyle changes are not enough to get your blood pressure under control, your health care provider may prescribe medicine. You may need to take more than one. Work closely with your health care provider to understand the risks and benefits. HOME CARE INSTRUCTIONS  Have your blood pressure rechecked as directed by your health care provider.   Take medicines only as directed by your health care provider. Follow the directions carefully. Blood pressure medicines must be taken as prescribed. The medicine does not work as well when you skip doses. Skipping doses also puts you at risk for problems.   Do not smoke.   Monitor your blood pressure at home as directed by your health care provider. SEEK MEDICAL CARE IF:   You think you are having a reaction to medicines taken.  You have recurrent headaches or feel dizzy.  You have swelling in your ankles.  You have trouble with your vision. SEEK IMMEDIATE MEDICAL CARE IF:  You develop a severe headache or confusion.  You have unusual weakness, numbness, or feel faint.  You have severe chest or abdominal pain.  You vomit repeatedly.  You have trouble breathing. MAKE SURE YOU:   Understand these instructions.  Will watch your condition.  Will get help right away if you are not doing well or get worse. Document Released: 09/21/2005 Document Revised: 02/05/2014 Document Reviewed: 07/14/2013 [facility] Patient Information 2015 [place], [place]. This information is not intended to replace advice given to you by your health care provider. Make sure you discuss any questions you have with your health care provider.

## 2015-05-26 ENCOUNTER — Encounter: Payer: Self-pay | Admitting: Family Medicine

## 2015-05-26 DIAGNOSIS — E663 Overweight: Secondary | ICD-10-CM | POA: Insufficient documentation

## 2015-05-26 NOTE — Assessment & Plan Note (Signed)
Deteriorated. Patient re-educated about  the importance of commitment to a  minimum of 150 minutes of exercise per week.  The importance of healthy food choices with portion control discussed. Encouraged to start a food diary, count calories and to consider  joining a support group. Sample diet sheets offered. Goals set by the patient for the next several months.   Weight /BMI 05/23/2015 04/29/2015 04/10/2015  WEIGHT 153 lb 0.6 oz 150 lb -  HEIGHT 5\' 4"  5\' 4"  5\' 4"   BMI 26.26 kg/m2 25.73 kg/m2 -    Current exercise per week zero minutes.

## 2015-05-26 NOTE — Assessment & Plan Note (Signed)
Hyperlipidemia:Low fat diet discussed and encouraged.   Lipid Panel  Lab Results  Component Value Date   CHOL 232* 02/16/2015   HDL 65 02/16/2015   LDLCALC 156* 02/16/2015   TRIG 53 02/16/2015   CHOLHDL 3.6 02/16/2015   Updated lab needed at/ before next visit.

## 2015-05-26 NOTE — Assessment & Plan Note (Signed)
Resolution [id number] menorrhagia, excellent recover from surgery, abck at work

## 2015-05-26 NOTE — Progress Notes (Signed)
Cheyenne Harris     MRN: [id number]      DOB: September 13, 1966   HPI Cheyenne Harris is here for follow up and re-evaluation of chronic medical conditions, medication management and review of any available recent lab and radiology data.  Preventive health is updated, specifically  Cancer screening and Immunization.   Has had successful gyne surgery, while recovering at home, no exercise , weight gain and blood pressure again elevated , which is the reason for her visit  ROS Denies recent fever or chills. Denies sinus pressure, nasal congestion, ear pain or sore throat. Denies chest congestion, productive cough or wheezing. Denies chest pains, palpitations and leg swelling Denies abdominal pain, nausea, vomiting,diarrhea or constipation.   Denies dysuria, frequency, hesitancy or incontinence. Denies joint pain, swelling and limitation in mobility. Denies headaches, seizures, numbness, or tingling. Denies depression, anxiety or insomnia. Denies skin break down or rash.   PE  BP 144/90 mmHg  Pulse 78  Resp 18  Ht 5\' 4"  (1.626 m)  Wt 153 lb 0.6 oz (69.418 kg)  BMI 26.26 kg/m2  SpO2 98%  LMP 10/11/2014  Patient alert and oriented and in no cardiopulmonary distress.  HEENT: No facial asymmetry, EOMI,   oropharynx pink and moist.  Neck supple no JVD, no mass.  Chest: Clear to auscultation bilaterally.  CVS: S1, S2 no murmurs, no S3.Regular rate.  ABD: Soft non tender.   Ext: No edema  MS: Adequate ROM spine, shoulders, hips and knees.  Skin: Intact, no ulcerations or rash noted.  Psych: Good eye contact, normal affect. Memory intact not anxious or depressed appearing.  CNS: CN 2-12 intact, power,  normal throughout.no focal deficits noted.   Assessment & Plan  Essential hypertension Has been on anti hypertensive med in the past. With weight gain and lack of exercise, blood pressure is again elevated, start low dose med DASH diet and commitment to daily physical activity for  a minimum of 30 minutes discussed and encouraged, as a part of hypertension management. The importance of attaining a healthy weight is also discussed.  BP/Weight 05/23/2015 04/29/2015 04/10/2015 03/27/2015 03/18/2015 12/10/1694 04/11/9380  Systolic BP 017 510 258 527 782 423 -  Diastolic BP 90 536 76 76 76 63 -  Wt. (Lbs) 153.04 150 - 142.5 147.2 - 152  BMI 26.26 25.73 - 24.45 25.25 - 26.08      Nurse BOP check in 4 weeks, MD follow up in several months  Hyperlipidemia LDL goal <100 Hyperlipidemia:Low fat diet discussed and encouraged.   Lipid Panel  Lab Results  Component Value Date   CHOL 232* 02/16/2015   HDL 65 02/16/2015   LDLCALC 156* 02/16/2015   TRIG 53 02/16/2015   CHOLHDL 3.6 02/16/2015   Updated lab needed at/ before next visit.      S/P abdominal hysterectomy Resolution [id number] menorrhagia, excellent recover from surgery, abck at work  Urinary incontinence Hope this will improve with recent gyne surgery will f/u next visit  Overweight (BMI 25.0-29.9) Deteriorated. Patient re-educated about  the importance of commitment to a  minimum of 150 minutes of exercise per week.  The importance of healthy food choices with portion control discussed. Encouraged to start a food diary, count calories and to consider  joining a support group. Sample diet sheets offered. Goals set by the patient for the next several months.   Weight /BMI 05/23/2015 04/29/2015 04/10/2015  WEIGHT 153 lb 0.6 oz 150 lb -  HEIGHT 5\' 4"  5\' 4"  5\' 4"   BMI 26.26 kg/m2 25.73 kg/m2 -    Current exercise per week zero minutes.

## 2015-05-26 NOTE — Assessment & Plan Note (Signed)
Has been on anti hypertensive med in the past. With weight gain and lack of exercise, blood pressure is again elevated, start low dose med DASH diet and commitment to daily physical activity for a minimum of 30 minutes discussed and encouraged, as a part of hypertension management. The importance of attaining a healthy weight is also discussed.  BP/Weight 05/23/2015 04/29/2015 04/10/2015 03/27/2015 03/18/2015 05/05/8402 04/09/4359  Systolic BP 677 034 035 248 185 909 -  Diastolic BP 90 311 76 76 76 63 -  Wt. (Lbs) 153.04 150 - 142.5 147.2 - 152  BMI 26.26 25.73 - 24.45 25.25 - 26.08      Nurse BOP check in 4 weeks, MD follow up in several months

## 2015-05-26 NOTE — Assessment & Plan Note (Signed)
Hope this will improve with recent gyne surgery will f/u next visit

## 2015-06-17 ENCOUNTER — Encounter: Payer: 59 | Admitting: Obstetrics and Gynecology

## 2015-07-20 LAB — COMPLETE METABOLIC PANEL WITH GFR
ALBUMIN: 3.9 g/dL (ref 3.6–5.1)
ALK PHOS: 89 U/L (ref 33–115)
ALT: 14 U/L (ref 6–29)
AST: 19 U/L (ref 10–35)
BILIRUBIN TOTAL: 0.8 mg/dL (ref 0.2–1.2)
BUN: 10 mg/dL (ref 7–25)
CALCIUM: 9.9 mg/dL (ref 8.6–10.2)
CO2: 28 mmol/L (ref 20–31)
Chloride: 100 mmol/L (ref 98–110)
Creat: 0.91 mg/dL (ref 0.50–1.10)
GFR, EST NON AFRICAN AMERICAN: 75 mL/min (ref >=60)
GFR, Est African American: 86 mL/min (ref >=60)
GLUCOSE: 82 mg/dL (ref 65–99)
POTASSIUM: 4 mmol/L (ref 3.5–5.3)
SODIUM: 139 mmol/L (ref 135–146)
TOTAL PROTEIN: 7.2 g/dL (ref 6.1–8.1)

## 2015-07-20 LAB — LIPID PANEL
CHOL/HDL RATIO: 2.3 ratio (ref <=5.0)
CHOLESTEROL: 171 mg/dL (ref 125–200)
HDL: 75 mg/dL (ref >=46)
LDL Cholesterol: 86 mg/dL (ref <130)
Triglycerides: 49 mg/dL (ref <150)
VLDL: 10 mg/dL (ref <30)

## 2015-07-30 ENCOUNTER — Ambulatory Visit (INDEPENDENT_AMBULATORY_CARE_PROVIDER_SITE_OTHER): Payer: 59 | Admitting: Family Medicine

## 2015-07-30 ENCOUNTER — Encounter: Payer: Self-pay | Admitting: Family Medicine

## 2015-07-30 VITALS — BP 144/90 | HR 68 | Resp 18 | Ht 64.0 in | Wt 154.1 lb

## 2015-07-30 DIAGNOSIS — I10 Essential (primary) hypertension: Secondary | ICD-10-CM | POA: Diagnosis not present

## 2015-07-30 DIAGNOSIS — E785 Hyperlipidemia, unspecified: Secondary | ICD-10-CM

## 2015-07-30 DIAGNOSIS — E663 Overweight: Secondary | ICD-10-CM

## 2015-07-30 MED ORDER — AMLODIPINE BESYLATE 5 MG PO TABS: 5.0000 mg | ORAL_TABLET | Freq: Every day | ORAL | Status: DC

## 2015-07-30 NOTE — Patient Instructions (Addendum)
4.5 months follow up with MD  Nurse BP check in 5 weeks  BP still high, INCREASE amlodipine to 5 mg one daily, OK tot take TWO 2.5 mg daily till done  Labs are excellent, no med change Mammogram NEEDS to be done , pls schedule  Please work on good  health habits so that your health will improve. 1. Commitment to daily physical activity for 30 to 60  minutes, if you are able to do this.  2. Commitment to wise food choices. Aim for half of your  food intake to be vegetable and fruit, one quarter starchy foods, and one quarter protein. Try to eat on a regular schedule  3 meals per day, snacking between meals should be limited to vegetables or fruits or small portions of nuts. 64 ounces of water per day is generally recommended, unless you have specific health conditions, like heart failure or kidney failure where you will need to limit fluid intake.  3. Commitment to sufficient and a  good quality of physical and mental rest daily, generally between 6 to 8 hours per day.  WITH PERSISTANCE AND PERSEVERANCE, THE IMPOSSIBLE , BECOMES THE NORM!  Thanks for choosing [facility], we consider it a privelige to serve you.

## 2015-07-30 NOTE — Progress Notes (Signed)
Subjective:    Patient ID: Cheyenne Harris, female    DOB: December 14, 1965, 49 y.o.   MRN: [id number]  HPI The PT is here for follow up and re-evaluation of chronic medical conditions, medication management and review of any available recent lab and radiology data.  Preventive health is updated, specifically  Cancer screening and Immunization.   Questions or concerns regarding consultations or procedures which the PT has had in the interim are  addressed. The PT denies any adverse reactions to current medications since the last visit.  There are no new concerns.  There are no specific complaints       Review of Systems See HPI Denies recent fever or chills. Denies sinus pressure, nasal congestion, ear pain or sore throat. Denies chest congestion, productive cough or wheezing. Denies chest pains, palpitations and leg swelling Denies abdominal pain, nausea, vomiting,diarrhea or constipation.   Denies dysuria, frequency, hesitancy or incontinence. Denies joint pain, swelling and limitation in mobility. Denies headaches, seizures, numbness, or tingling. Denies depression, anxiety or insomnia. Denies skin break down or rash.        Objective:   Physical Exam BP 144/90 mmHg  Pulse 68  Resp 18  Ht 5\' 4"  (1.626 m)  Wt 154 lb 1 oz (69.881 kg)  BMI 26.43 kg/m2  SpO2 96%  LMP 10/11/2014 Patient alert and oriented and in no cardiopulmonary distress.  HEENT: No facial asymmetry, EOMI,   oropharynx pink and moist.  Neck supple no JVD, no mass.  Chest: Clear to auscultation bilaterally.  CVS: S1, S2 no murmurs, no S3.Regular rate.  ABD: Soft non tender.   Ext: No edema  MS: Adequate ROM spine, shoulders, hips and knees.  Skin: Intact, no ulcerations or rash noted.  Psych: Good eye contact, normal affect. Memory intact not anxious or depressed appearing.  CNS: CN 2-12 intact, power,  normal throughout.no focal deficits noted.        Assessment & Plan:  Essential  hypertension Uncontrolled , increase dose amlodipine to 5 mg Nurse BP check in 5 weeks DASH diet and commitment to daily physical activity for a minimum of 30 minutes discussed and encouraged, as a part of hypertension management. The importance of attaining a healthy weight is also discussed.  BP/Weight 07/30/2015 05/23/2015 04/29/2015 04/10/2015 03/27/2015 02/20/8415 6/0/6301  Systolic BP 601 093 235 573 220 254 270  Diastolic BP 90 90 623 76 76 76 63  Wt. (Lbs) 154.06 153.04 150 - 142.5 147.2 -  BMI 26.43 26.26 25.73 - 24.45 25.25 -        Overweight (BMI 25.0-29.9) Deteriorated. Patient re-educated about  the importance of commitment to a  minimum of 150 minutes of exercise per week.  The importance of healthy food choices with portion control discussed. Encouraged to start a food diary, count calories and to consider  joining a support group. Sample diet sheets offered. Goals set by the patient for the next several months.   Weight /BMI 07/30/2015 05/23/2015 04/29/2015  WEIGHT 154 lb 1 oz 153 lb 0.6 oz 150 lb  HEIGHT 5\' 4"  5\' 4"  5\' 4"   BMI 26.43 kg/m2 26.26 kg/m2 25.73 kg/m2    Current exercise per week 150 minutes.   Hyperlipidemia LDL goal <100 Controlled, no change in medication Hyperlipidemia:Low fat diet discussed and encouraged.   Lipid Panel  Lab Results  Component Value Date   CHOL 171 07/20/2015   HDL 75 07/20/2015   LDLCALC 86 07/20/2015   TRIG 49 07/20/2015  CHOLHDL 2.3 07/20/2015

## 2015-08-05 NOTE — Assessment & Plan Note (Signed)
Deteriorated. Patient re-educated about  the importance of commitment to a  minimum of 150 minutes of exercise per week.  The importance of healthy food choices with portion control discussed. Encouraged to start a food diary, count calories and to consider  joining a support group. Sample diet sheets offered. Goals set by the patient for the next several months.   Weight /BMI 07/30/2015 05/23/2015 04/29/2015  WEIGHT 154 lb 1 oz 153 lb 0.6 oz 150 lb  HEIGHT 5\' 4"  5\' 4"  5\' 4"   BMI 26.43 kg/m2 26.26 kg/m2 25.73 kg/m2    Current exercise per week 150 minutes.

## 2015-08-05 NOTE — Assessment & Plan Note (Signed)
Uncontrolled , increase dose amlodipine to 5 mg Nurse BP check in 5 weeks DASH diet and commitment to daily physical activity for a minimum of 30 minutes discussed and encouraged, as a part of hypertension management. The importance of attaining a healthy weight is also discussed.  BP/Weight 07/30/2015 05/23/2015 04/29/2015 04/10/2015 03/27/2015 11/08/5595 01/04/6383  Systolic BP 536 468 032 122 482 500 370  Diastolic BP 90 90 488 76 76 76 63  Wt. (Lbs) 154.06 153.04 150 - 142.5 147.2 -  BMI 26.43 26.26 25.73 - 24.45 25.25 -

## 2015-08-05 NOTE — Assessment & Plan Note (Signed)
Controlled, no change in medication Hyperlipidemia:Low fat diet discussed and encouraged.   Lipid Panel  Lab Results  Component Value Date   CHOL 171 07/20/2015   HDL 75 07/20/2015   LDLCALC 86 07/20/2015   TRIG 49 07/20/2015   CHOLHDL 2.3 07/20/2015

## 2015-08-15 ENCOUNTER — Other Ambulatory Visit: Payer: Self-pay | Admitting: Family Medicine

## 2015-08-19 ENCOUNTER — Ambulatory Visit (HOSPITAL_COMMUNITY)
Admission: RE | Admit: 2015-08-19 | Discharge: 2015-08-19 | Disposition: A | Discharge status: Home / Self Care | Payer: 59 | Source: Ambulatory Visit | Attending: Family Medicine | Admitting: Family Medicine

## 2015-08-19 DIAGNOSIS — Z1231 Encounter for screening mammogram for malignant neoplasm of breast: Secondary | ICD-10-CM

## 2015-12-04 ENCOUNTER — Other Ambulatory Visit: Payer: Self-pay | Admitting: Family Medicine

## 2015-12-12 ENCOUNTER — Ambulatory Visit (INDEPENDENT_AMBULATORY_CARE_PROVIDER_SITE_OTHER): Payer: 59 | Admitting: Family Medicine

## 2015-12-12 ENCOUNTER — Encounter: Payer: Self-pay | Admitting: Family Medicine

## 2015-12-12 VITALS — BP 118/80 | HR 82 | Resp 16 | Ht 64.0 in | Wt 157.1 lb

## 2015-12-12 DIAGNOSIS — E785 Hyperlipidemia, unspecified: Secondary | ICD-10-CM | POA: Diagnosis not present

## 2015-12-12 DIAGNOSIS — J3089 Other allergic rhinitis: Secondary | ICD-10-CM | POA: Diagnosis not present

## 2015-12-12 DIAGNOSIS — E663 Overweight: Secondary | ICD-10-CM | POA: Diagnosis not present

## 2015-12-12 DIAGNOSIS — I10 Essential (primary) hypertension: Secondary | ICD-10-CM | POA: Diagnosis not present

## 2015-12-12 MED ORDER — PREDNISONE 5 MG PO TABS: 5.0000 mg | ORAL_TABLET | Freq: Two times a day (BID) | ORAL | Status: DC

## 2015-12-12 NOTE — Progress Notes (Signed)
Subjective:    Patient ID: Cheyenne Harris, female    DOB: 1966/03/05, 50 y.o.   MRN: [id number]  HPI   Cheyenne Harris     MRN: [id number]      DOB: 25-Feb-1966   HPI Cheyenne Harris is here for follow up and re-evaluation of chronic medical conditions, medication management and review of any available recent lab and radiology data.  Preventive health is updated, specifically  Cancer screening and Immunization.    The PT denies any adverse reactions to current medications since the last visit.  4 day h/o increased frontal pressure, watery itchy eyes, excessive sneezing and clear nasal drainage. No fevr or chills, returned from 10 day stay in [place] 4 days ago  ROS Denies recent fever or chills. Denies sinus pressure, nasal congestion, ear pain or sore throat. Denies chest congestion, productive cough or wheezing. Denies chest pains, palpitations and leg swelling Denies abdominal pain, nausea, vomiting,diarrhea or constipation.   Denies dysuria, frequency, hesitancy or incontinence. Denies joint pain, swelling and limitation in mobility. Denies headaches, seizures, numbness, or tingling. Denies depression, anxiety or insomnia. Denies skin break down or rash.   PE  BP 118/80 mmHg  Pulse 82  Resp 16  Ht 5\' 4"  (1.626 m)  Wt 157 lb 1.9 oz (71.269 kg)  BMI 26.96 kg/m2  SpO2 98%  LMP 10/11/2014  Patient alert and oriented and in no cardiopulmonary distress.  HEENT: No facial asymmetry, EOMI,   oropharynx pink and moist.  Neck supple no JVD, no mass. TM clear, no sinus tenderness, erythema and watery conjunctiva, erythema and edema of nasal mucosa Chest: Clear to auscultation bilaterally.  CVS: S1, S2 no murmurs, no S3.Regular rate.  ABD: Soft non tender.   Ext: No edema  MS: Adequate ROM spine, shoulders, hips and knees.  Skin: Intact, no ulcerations or rash noted.  Psych: Good eye contact, normal affect. Memory intact not anxious or depressed appearing.  CNS: CN 2-12  intact, power,  normal throughout.no focal deficits noted.   Assessment & Plan   Essential hypertension Controlled, no change in medication DASH diet and commitment to daily physical activity for a minimum of 30 minutes discussed and encouraged, as a part of hypertension management. The importance of attaining a healthy weight is also discussed.  BP/Weight 12/12/2015 07/30/2015 05/23/2015 04/29/2015 04/10/2015 03/27/2015 [id number]  Systolic BP 123456 123456 123456 [id number] 123456 123456  Diastolic BP 80 90 90 [id number] 76 76 76  Wt. (Lbs) 157.12 154.06 153.04 150 - 142.5 147.2  BMI 26.96 26.43 26.26 25.73 - 24.45 25.25        Hyperlipidemia LDL goal <100 Hyperlipidemia:Low fat diet discussed and encouraged.   Lipid Panel  Lab Results  Component Value Date   CHOL 171 07/20/2015   HDL 75 07/20/2015   LDLCALC 86 07/20/2015   TRIG 49 07/20/2015   CHOLHDL 2.3 07/20/2015   Updated lab needed at/ before next visit.      Overweight (BMI 25.0-29.9) Deteriorated. Patient re-educated about  the importance of commitment to a  minimum of 150 minutes of exercise per week.  The importance of healthy food choices with portion control discussed. Encouraged to start a food diary, count calories and to consider  joining a support group. Sample diet sheets offered. Goals set by the patient for the next several months.   Weight /BMI 12/12/2015 07/30/2015 05/23/2015  WEIGHT 157 lb 1.9 oz 154 lb 1 oz 153 lb 0.6 oz  HEIGHT 5\' 4"  5\' 4"   5\' 4"   BMI 26.96 kg/m2 26.43 kg/m2 26.26 kg/m2    Current exercise per week 90 minutes.   Allergic rhinitis Uncontrolled currently, 5 day course of prednisone prescribed Start saline nasal flushes, continue daily claritin and advised pt to use once daily sudafed for excess drainage. Tylenol for headche  Call if worsens with fever and green drainage in next 2 weeks   *   Review of Systems     Objective:   Physical Exam        Assessment & Plan:

## 2015-12-12 NOTE — Patient Instructions (Addendum)
CPE in end September , call if you need em before  Prednisone sent for 5 days, add sudafed on daily as needed for excess drainage , continue daily claritin Salt water nasal flushes twice daily please  If you develiop fever , chills or worsen in next 2 weeks, call , I will send antibiotics  Good Blood pressure  Fasting labs in April please  Thanks for choosing [facility], we consider it a privelige to serve you.

## 2015-12-13 ENCOUNTER — Encounter: Payer: Self-pay | Admitting: Family Medicine

## 2015-12-13 DIAGNOSIS — J309 Allergic rhinitis, unspecified: Secondary | ICD-10-CM | POA: Insufficient documentation

## 2015-12-13 NOTE — Assessment & Plan Note (Signed)
Uncontrolled currently, 5 day course of prednisone prescribed Start saline nasal flushes, continue daily claritin and advised pt to use once daily sudafed for excess drainage. Tylenol for headche  Call if worsens with fever and green drainage in next 2 weeks

## 2015-12-13 NOTE — Assessment & Plan Note (Signed)
Controlled, no change in medication DASH diet and commitment to daily physical activity for a minimum of 30 minutes discussed and encouraged, as a part of hypertension management. The importance of attaining a healthy weight is also discussed.  BP/Weight 12/12/2015 07/30/2015 05/23/2015 04/29/2015 04/10/2015 03/27/2015 [id number]  Systolic BP 123456 123456 123456 [id number] 123456 123456  Diastolic BP 80 90 90 [id number] 76 76 76  Wt. (Lbs) 157.12 154.06 153.04 150 - 142.5 147.2  BMI 26.96 26.43 26.26 25.73 - 24.45 25.25

## 2015-12-13 NOTE — Assessment & Plan Note (Signed)
Deteriorated. Patient re-educated about  the importance of commitment to a  minimum of 150 minutes of exercise per week.  The importance of healthy food choices with portion control discussed. Encouraged to start a food diary, count calories and to consider  joining a support group. Sample diet sheets offered. Goals set by the patient for the next several months.   Weight /BMI 12/12/2015 07/30/2015 05/23/2015  WEIGHT 157 lb 1.9 oz 154 lb 1 oz 153 lb 0.6 oz  HEIGHT 5\' 4"  5\' 4"  5\' 4"   BMI 26.96 kg/m2 26.43 kg/m2 26.26 kg/m2    Current exercise per week 90 minutes.

## 2015-12-13 NOTE — Assessment & Plan Note (Signed)
Hyperlipidemia:Low fat diet discussed and encouraged.   Lipid Panel  Lab Results  Component Value Date   CHOL 171 07/20/2015   HDL 75 07/20/2015   LDLCALC 86 07/20/2015   TRIG 49 07/20/2015   CHOLHDL 2.3 07/20/2015    Updated lab needed at/ before next visit.   

## 2016-02-14 ENCOUNTER — Other Ambulatory Visit: Payer: Self-pay | Admitting: Family Medicine

## 2016-04-01 ENCOUNTER — Other Ambulatory Visit: Payer: Self-pay | Admitting: Family Medicine

## 2016-05-12 ENCOUNTER — Other Ambulatory Visit: Payer: Self-pay

## 2016-05-12 MED ORDER — AMLODIPINE BESYLATE 5 MG PO TABS: 5.0000 mg | ORAL_TABLET | Freq: Every day | ORAL | 1 refills | Status: DC

## 2016-06-01 ENCOUNTER — Other Ambulatory Visit: Payer: Self-pay | Admitting: Family Medicine

## 2016-06-15 ENCOUNTER — Other Ambulatory Visit: Payer: Self-pay | Admitting: Obstetrics and Gynecology

## 2016-06-22 ENCOUNTER — Telehealth: Payer: Self-pay | Admitting: Family Medicine

## 2016-06-22 NOTE — Telephone Encounter (Signed)
LM to reschedule - Dr. Simpson PAL °

## 2016-06-30 ENCOUNTER — Encounter: Payer: 59 | Admitting: Family Medicine

## 2016-08-24 ENCOUNTER — Other Ambulatory Visit: Payer: Self-pay | Admitting: Family Medicine

## 2016-08-24 ENCOUNTER — Other Ambulatory Visit: Payer: Self-pay | Admitting: Obstetrics and Gynecology

## 2016-08-25 NOTE — Telephone Encounter (Signed)
Refilled x 8 months

## 2016-10-06 ENCOUNTER — Other Ambulatory Visit: Payer: Self-pay | Admitting: Family Medicine

## 2016-10-06 DIAGNOSIS — Z1231 Encounter for screening mammogram for malignant neoplasm of breast: Secondary | ICD-10-CM

## 2016-10-15 ENCOUNTER — Ambulatory Visit (INDEPENDENT_AMBULATORY_CARE_PROVIDER_SITE_OTHER): Payer: 59 | Admitting: Family Medicine

## 2016-10-15 ENCOUNTER — Encounter: Payer: Self-pay | Admitting: Family Medicine

## 2016-10-15 VITALS — BP 118/76 | HR 68 | Temp 98.0°F | Resp 18 | Ht 64.0 in | Wt 154.0 lb

## 2016-10-15 DIAGNOSIS — B9689 Other specified bacterial agents as the cause of diseases classified elsewhere: Secondary | ICD-10-CM

## 2016-10-15 DIAGNOSIS — J019 Acute sinusitis, unspecified: Secondary | ICD-10-CM

## 2016-10-15 MED ORDER — AMOXICILLIN-POT CLAVULANATE 875-125 MG PO TABS: 1.0000 | ORAL_TABLET | Freq: Two times a day (BID) | ORAL | 1 refills | Status: DC

## 2016-10-15 NOTE — Progress Notes (Signed)
Chief Complaint  Patient presents with  . URI    2 - 3 weeks   Primary care physician is Tula Nakayama M.D. Patient is here for an acute visit. She has had symptoms consistent with a sinus infection for 2 weeks. She takes Claritin daily for allergies. She has taken Mucinex/D for the last 2 weeks as well as using saline nasal rinses. In spite of this she continues to have sinus pressure and pain, headache, purulent postnasal drip. She also has had some fever, poor exercise tolerance, fatigue for the last 4 or 5 days. No coughing or chest congestion. No wheezing or chest pain. No ear pressure or pain. No sore throat.  Patient Active Problem List   Diagnosis Date Noted  . Allergic rhinitis 12/13/2015  . Overweight (BMI 25.0-29.9) 05/26/2015  . Essential hypertension 05/23/2015  . Urinary incontinence 03/13/2013  . CONSTIPATION, CHRONIC 02/27/2010  . Hyperlipidemia LDL goal <100 01/11/2008    Outpatient Encounter Prescriptions as of 10/15/2016  Medication Sig  . acetaminophen (TYLENOL) 325 MG tablet Take 650 mg by mouth every 6 (six) hours as needed.  Marland Kitchen amLODipine (NORVASC) 5 MG tablet Take 1 tablet (5 mg total) by mouth daily.  Marland Kitchen estradiol (VIVELLE-DOT) 0.1 MG/24HR patch PLACE 1 PATCH (0.1 MG TOTAL) ONTO THE SKIN 2 (TWO) TIMES A WEEK.  Marland Kitchen KRILL OIL PO Take 1 capsule by mouth daily.  Marland Kitchen loratadine (CLARITIN) 10 MG tablet Take 10 mg by mouth daily.  . Multiple Vitamins-Minerals (MULTIVITAMIN WITH MINERALS) tablet Take 1 tablet by mouth daily.  . Probiotic Product (PROBIOTIC PO) Take 1 tablet by mouth daily.  . psyllium (METAMUCIL) 58.6 % powder Take 1 packet by mouth as needed.    . rosuvastatin (CRESTOR) 5 MG tablet TAKE 1 TABLET BY MOUTH EVERY DAY  . amoxicillin-clavulanate (AUGMENTIN) 875-125 MG tablet Take 1 tablet by mouth 2 (two) times daily.   No facility-administered encounter medications on file as of 10/15/2016.     Allergies  Allergen Reactions  . Sulfa Antibiotics  Hives    Review of Systems  Constitutional: Positive for chills, fatigue and fever.  HENT: Positive for congestion, postnasal drip, rhinorrhea, sinus pain and sinus pressure. Negative for ear pain and sore throat.   Eyes: Negative for redness and visual disturbance.  Respiratory: Negative for cough and wheezing.   Cardiovascular: Negative for chest pain and palpitations.  Gastrointestinal: Negative for diarrhea, nausea and vomiting.  Genitourinary: Negative for difficulty urinating and frequency.  Musculoskeletal: Negative.  Negative for myalgias.  Neurological: Positive for dizziness and headaches.  Psychiatric/Behavioral: Negative.     BP 118/76 (BP Location: Right Arm, Patient Position: Sitting, Cuff Size: Normal)   Pulse 68   Temp 98 F (36.7 C) (Oral)   Resp 18   Ht 5\' 4"  (1.626 m)   Wt 154 lb 0.6 oz (69.9 kg)   LMP 10/11/2014 Comment: hysterectomy  SpO2 97%   BMI 26.44 kg/m   Physical Exam  Constitutional: She is oriented to person, place, and time. She appears well-developed and well-nourished. No distress.  HENT:  Head: Normocephalic and atraumatic.  Right Ear: External ear normal.  Left Ear: External ear normal.  Mouth/Throat: No oropharyngeal exudate.  Mild posterior pharyngeal injection. Nasal membranes are swollen and red. Maxillary sinuses are tender  Eyes: Pupils are equal, round, and reactive to light.  Cardiovascular: Normal rate, regular rhythm and normal heart sounds.   Pulmonary/Chest: Effort normal and breath sounds normal. No respiratory distress.  Abdominal: Soft. Bowel  sounds are normal. She exhibits no distension.  No organomegaly  Musculoskeletal: Normal range of motion.  Lymphadenopathy:    She has no cervical adenopathy.  Neurological: She is alert and oriented to person, place, and time.  Psychiatric: She has a normal mood and affect. Her behavior is normal.    ASSESSMENT/PLAN:  Acute bacterial rhinosinusitis   Patient Instructions    Push fluids Take the Augmentin as directed for 5 days If needed can refill, but 5 days is usually enough Continue the mucinex D for a few more days Call if not better by Monday    Raylene Everts, MD

## 2016-10-15 NOTE — Patient Instructions (Signed)
Push fluids Take the Augmentin as directed for 5 days If needed can refill, but 5 days is usually enough Continue the mucinex D for a few more days Call if not better by Monday

## 2016-10-19 ENCOUNTER — Ambulatory Visit (HOSPITAL_COMMUNITY)
Admission: RE | Admit: 2016-10-19 | Discharge: 2016-10-19 | Disposition: A | Discharge status: Home / Self Care | Payer: 59 | Source: Ambulatory Visit | Attending: Family Medicine | Admitting: Family Medicine

## 2016-10-19 DIAGNOSIS — Z1231 Encounter for screening mammogram for malignant neoplasm of breast: Secondary | ICD-10-CM | POA: Insufficient documentation

## 2016-11-10 ENCOUNTER — Ambulatory Visit (INDEPENDENT_AMBULATORY_CARE_PROVIDER_SITE_OTHER): Payer: 59 | Admitting: Family Medicine

## 2016-11-10 ENCOUNTER — Encounter: Payer: Self-pay | Admitting: Family Medicine

## 2016-11-10 VITALS — BP 120/84 | HR 82 | Resp 15 | Ht 64.0 in | Wt 156.0 lb

## 2016-11-10 DIAGNOSIS — E663 Overweight: Secondary | ICD-10-CM | POA: Diagnosis not present

## 2016-11-10 DIAGNOSIS — E785 Hyperlipidemia, unspecified: Secondary | ICD-10-CM

## 2016-11-10 DIAGNOSIS — I10 Essential (primary) hypertension: Secondary | ICD-10-CM

## 2016-11-10 DIAGNOSIS — N951 Menopausal and female climacteric states: Secondary | ICD-10-CM

## 2016-11-10 MED ORDER — VENLAFAXINE HCL ER 37.5 MG PO CP24: 37.5000 mg | ORAL_CAPSULE | Freq: Every day | ORAL | 6 refills | Status: DC

## 2016-11-10 NOTE — Patient Instructions (Addendum)
F/u in 6 month, call if you need me before  Fasting lipid, cmp, CBC, TSH , Vit D as soon as possible  New for hot flashes is effexor once daily, and try to wean off the hormone replacement   You are referred for colonoscopy after your 51 th birthday bash!  It is important that you exercise regularly at least 30 minutes 5 times a week. If you develop chest pain, have severe difficulty breathing, or feel very tired, stop exercising immediately and seek medical attention    Please work on good  health habits so that your health will improve. 1. Commitment to daily physical activity for 30 to 60  minutes, if you are able to do this.  2. Commitment to wise food choices. Aim for half of your  food intake to be vegetable and fruit, one quarter starchy foods, and one quarter protein. Try to eat on a regular schedule  3 meals per day, snacking between meals should be limited to vegetables or fruits or small portions of nuts. 64 ounces of water per day is generally recommended, unless you have specific health conditions, like heart failure or kidney failure where you will need to limit fluid intake.  3. Commitment to sufficient and a  good quality of physical and mental rest daily, generally between 6 to 8 hours per day.  WITH PERSISTANCE AND PERSEVERANCE, THE IMPOSSIBLE , BECOMES THE NORM!      Menopause Menopause is the normal time of life when menstrual periods stop completely. Menopause is complete when you have missed 12 consecutive menstrual periods. It usually occurs between the ages of 50 years and 8 years. Very rarely does a woman develop menopause before the age of 50 years. At menopause, your ovaries stop producing the female hormones estrogen and progesterone. This can cause undesirable symptoms and also affect your health. Sometimes the symptoms may occur 4-5 years before the menopause begins. There is no relationship between menopause and:  Oral contraceptives.  Number of children  you had.  Race.  The age your menstrual periods started (menarche). Heavy smokers and very thin women may develop menopause earlier in life. What are the causes?  The ovaries stop producing the female hormones estrogen and progesterone. Other causes include:  Surgery to remove both ovaries.  The ovaries stop functioning for no known reason.  Tumors of the pituitary gland in the brain.  Medical disease that affects the ovaries and hormone production.  Radiation treatment to the abdomen or pelvis.  Chemotherapy that affects the ovaries. What are the signs or symptoms?  Hot flashes.  Night sweats.  Decrease in sex drive.  Vaginal dryness and thinning of the vagina causing painful intercourse.  Dryness of the skin and developing wrinkles.  Headaches.  Tiredness.  Irritability.  Memory problems.  Weight gain.  Bladder infections.  Hair growth of the face and chest.  Infertility. More serious symptoms include:  Loss of bone (osteoporosis) causing breaks (fractures).  Depression.  Hardening and narrowing of the arteries (atherosclerosis) causing heart attacks and strokes. How is this diagnosed?  When the menstrual periods have stopped for 12 straight months.  Physical exam.  Hormone studies of the blood. How is this treated? There are many treatment choices and nearly as many questions about them. The decisions to treat or not to treat menopausal changes is an individual choice made with your health care provider. Your health care provider can discuss the treatments with you. Together, you can decide which treatment will  work best for you. Your treatment choices may include:  Hormone therapy (estrogen and progesterone).  Non-hormonal medicines.  Treating the individual symptoms with medicine (for example antidepressants for depression).  Herbal medicines that may help specific symptoms.  Counseling by a psychiatrist or psychologist.  Group  therapy.  Lifestyle changes including:  Eating healthy.  Regular exercise.  Limiting caffeine and alcohol.  Stress management and meditation.  No treatment. Follow these instructions at home:  Take the medicine your health care provider gives you as directed.  Get plenty of sleep and rest.  Exercise regularly.  Eat a diet that contains calcium (good for the bones) and soy products (acts like estrogen hormone).  Avoid alcoholic beverages.  Do not smoke.  If you have hot flashes, dress in layers.  Take supplements, calcium, and vitamin D to strengthen bones.  You can use over-the-counter lubricants or moisturizers for vaginal dryness.  Group therapy is sometimes very helpful.  Acupuncture may be helpful in some cases. Contact a health care provider if:  You are not sure you are in menopause.  You are having menopausal symptoms and need advice and treatment.  You are still having menstrual periods after age 50 years.  You have pain with intercourse.  Menopause is complete (no menstrual period for 12 months) and you develop vaginal bleeding.  You need a referral to a specialist (gynecologist, psychiatrist, or psychologist) for treatment. Get help right away if:  You have severe depression.  You have excessive vaginal bleeding.  You fell and think you have a broken bone.  You have pain when you urinate.  You develop leg or chest pain.  You have a fast pounding heart beat (palpitations).  You have severe headaches.  You develop vision problems.  You feel a lump in your breast.  You have abdominal pain or severe indigestion. This information is not intended to replace advice given to you by your health care provider. Make sure you discuss any questions you have with your health care provider. Document Released: 12/12/2003 Document Revised: 02/27/2016 Document Reviewed: 04/20/2013 Elsevier Interactive Patient Education  2017 [employer].

## 2016-11-14 NOTE — Assessment & Plan Note (Signed)
Deteriorated. Patient re-educated about  the importance of commitment to a  minimum of 150 minutes of exercise per week.  The importance of healthy food choices with portion control discussed. Encouraged to start a food diary, count calories and to consider  joining a support group. Sample diet sheets offered. Goals set by the patient for the next several months.   Weight /BMI 11/10/2016 10/15/2016 12/12/2015  WEIGHT 156 lb 154 lb 0.6 oz 157 lb 1.9 oz  HEIGHT 5\' 4"  5\' 4"  5\' 4"   BMI 26.78 kg/m2 26.44 kg/m2 26.96 kg/m2

## 2016-11-14 NOTE — Assessment & Plan Note (Signed)
Hyperlipidemia:Low fat diet discussed and encouraged.   Lipid Panel  Lab Results  Component Value Date   CHOL 171 07/20/2015   HDL 75 07/20/2015   LDLCALC 86 07/20/2015   TRIG 49 07/20/2015   CHOLHDL 2.3 07/20/2015    Updated lab needed at/ before next visit.

## 2016-11-14 NOTE — Assessment & Plan Note (Signed)
Controlled, no change in medication DASH diet and commitment to daily physical activity for a minimum of 30 minutes discussed and encouraged, as a part of hypertension management. The importance of attaining a healthy weight is also discussed.  BP/Weight 11/10/2016 10/15/2016 12/12/2015 07/30/2015 05/23/2015 [id number]  Systolic BP 123456 123456 123456 123456 123456 [id number]  Diastolic BP 84 76 80 90 90 100 76  Wt. (Lbs) 156 154.04 157.12 154.06 153.04 150 -  BMI 26.78 26.44 26.96 26.43 26.26 25.73 -

## 2016-11-14 NOTE — Progress Notes (Signed)
   Cheyenne Harris     MRN: [id number]      DOB: 1966/07/22   HPI [name] is here for follow up and re-evaluation of chronic medical conditions, medication management and review of any available recent lab and radiology data.  Preventive health is updated, specifically  Cancer screening and Immunization.   Questions or concerns regarding consultations or procedures which the PT has had in the interim are  addressed. The PT denies any adverse reactions to current medications since the last visit.  There are no new concerns.  There are no specific complaints   ROS Denies recent fever or chills. Denies sinus pressure, nasal congestion, ear pain or sore throat. Denies chest congestion, productive cough or wheezing. Denies chest pains, palpitations and leg swelling Denies abdominal pain, nausea, vomiting,diarrhea or constipation.   Denies dysuria, frequency, hesitancy or incontinence. Denies joint pain, swelling and limitation in mobility. Denies headaches, seizures, numbness, or tingling. Denies depression, anxiety or insomnia. Denies skin break down or rash.   PE  BP 120/84   Pulse 82   Resp 15   Ht 5\' 4"  (1.626 m)   Wt 156 lb (70.8 kg)   LMP 10/11/2014 Comment: hysterectomy  SpO2 96%   BMI 26.78 kg/m   Patient alert and oriented and in no cardiopulmonary distress.  HEENT: No facial asymmetry, EOMI,   oropharynx pink and moist.  Neck supple no JVD, no mass.  Chest: Clear to auscultation bilaterally.  CVS: S1, S2 no murmurs, no S3.Regular rate.  ABD: Soft non tender.   Ext: No edema  MS: Adequate ROM spine, shoulders, hips and knees.  Skin: Intact, no ulcerations or rash noted.  Psych: Good eye contact, normal affect. Memory intact not anxious or depressed appearing.  CNS: CN 2-12 intact, power,  normal throughout.no focal deficits noted.   Assessment & Plan  Essential hypertension Controlled, no change in medication DASH diet and commitment to daily  physical activity for a minimum of 30 minutes discussed and encouraged, as a part of hypertension management. The importance of attaining a healthy weight is also discussed.  BP/Weight 11/10/2016 10/15/2016 12/12/2015 07/30/2015 05/23/2015 [id number]  Systolic BP 123456 123456 123456 123456 [id number] [id number]  Diastolic BP 84 76 80 90 90 100 76  Wt. (Lbs) 156 154.04 157.12 154.06 153.04 150 -  BMI 26.78 26.44 26.96 26.43 26.26 25.73 -       Hyperlipidemia LDL goal <100 Hyperlipidemia:Low fat diet discussed and encouraged.   Lipid Panel  Lab Results  Component Value Date   CHOL 171 07/20/2015   HDL 75 07/20/2015   LDLCALC 86 07/20/2015   TRIG 49 07/20/2015   CHOLHDL 2.3 07/20/2015    Updated lab needed at/ before next visit.    Overweight (BMI 25.0-29.9) Deteriorated. Patient re-educated about  the importance of commitment to a  minimum of 150 minutes of exercise per week.  The importance of healthy food choices with portion control discussed. Encouraged to start a food diary, count calories and to consider  joining a support group. Sample diet sheets offered. Goals set by the patient for the next several months.   Weight /BMI 11/10/2016 10/15/2016 12/12/2015  WEIGHT 156 lb 154 lb 0.6 oz 157 lb 1.9 oz  HEIGHT 5\' 4"  5\' 4"  5\' 4"   BMI 26.78 kg/m2 26.44 kg/m2 26.96 kg/m2

## 2016-11-19 LAB — CBC
HEMATOCRIT: 42.4 % (ref 35.0–45.0)
HEMOGLOBIN: 14.2 g/dL (ref 11.7–15.5)
MCH: 29.2 pg (ref 27.0–33.0)
MCHC: 33.5 g/dL (ref 32.0–36.0)
MCV: 87.2 fL (ref 80.0–100.0)
MPV: 11.7 fL (ref 7.5–12.5)
PLATELETS: 228 10*3/uL (ref 140–400)
RBC: 4.86 MIL/uL (ref 3.80–5.10)
RDW: 13.8 % (ref 11.0–15.0)
WBC: 4.2 10*3/uL (ref 3.8–10.8)

## 2016-11-19 LAB — LIPID PANEL
CHOLESTEROL: 181 mg/dL (ref <200)
HDL: 79 mg/dL (ref >50)
LDL Cholesterol: 93 mg/dL (ref <100)
Total CHOL/HDL Ratio: 2.3 Ratio (ref <5.0)
Triglycerides: 47 mg/dL (ref <150)
VLDL: 9 mg/dL (ref <30)

## 2016-11-19 LAB — COMPREHENSIVE METABOLIC PANEL
ALT: 12 U/L (ref 6–29)
AST: 16 U/L (ref 10–35)
Albumin: 4.1 g/dL (ref 3.6–5.1)
Alkaline Phosphatase: 73 U/L (ref 33–115)
BILIRUBIN TOTAL: 0.7 mg/dL (ref 0.2–1.2)
BUN: 15 mg/dL (ref 7–25)
CALCIUM: 9.6 mg/dL (ref 8.6–10.2)
CO2: 28 mmol/L (ref 20–31)
Chloride: 106 mmol/L (ref 98–110)
Creat: 1.01 mg/dL (ref 0.50–1.10)
GLUCOSE: 82 mg/dL (ref 65–99)
Potassium: 4.3 mmol/L (ref 3.5–5.3)
Sodium: 141 mmol/L (ref 135–146)
Total Protein: 7.1 g/dL (ref 6.1–8.1)

## 2016-11-19 LAB — TSH: TSH: 1.55 mIU/L

## 2016-11-20 ENCOUNTER — Encounter: Payer: Self-pay | Admitting: Family Medicine

## 2016-11-20 LAB — VITAMIN D 25 HYDROXY (VIT D DEFICIENCY, FRACTURES): Vit D, 25-Hydroxy: 53 ng/mL (ref 30–100)

## 2016-11-28 ENCOUNTER — Other Ambulatory Visit: Payer: Self-pay | Admitting: Family Medicine

## 2016-11-29 ENCOUNTER — Other Ambulatory Visit: Payer: Self-pay | Admitting: Family Medicine

## 2017-02-15 ENCOUNTER — Encounter: Payer: Self-pay | Admitting: Family Medicine

## 2017-02-15 ENCOUNTER — Encounter (INDEPENDENT_AMBULATORY_CARE_PROVIDER_SITE_OTHER): Payer: Self-pay | Admitting: *Deleted

## 2017-02-15 ENCOUNTER — Telehealth: Payer: Self-pay | Admitting: Family Medicine

## 2017-02-15 ENCOUNTER — Ambulatory Visit (INDEPENDENT_AMBULATORY_CARE_PROVIDER_SITE_OTHER): Payer: 59 | Admitting: Family Medicine

## 2017-02-15 ENCOUNTER — Ambulatory Visit: Payer: 59

## 2017-02-15 VITALS — BP 110/74 | HR 80 | Temp 98.1°F | Resp 16 | Ht 64.0 in | Wt 151.0 lb

## 2017-02-15 DIAGNOSIS — Z1211 Encounter for screening for malignant neoplasm of colon: Secondary | ICD-10-CM

## 2017-02-15 DIAGNOSIS — G4489 Other headache syndrome: Secondary | ICD-10-CM | POA: Diagnosis not present

## 2017-02-15 DIAGNOSIS — R110 Nausea: Secondary | ICD-10-CM | POA: Diagnosis not present

## 2017-02-15 DIAGNOSIS — I10 Essential (primary) hypertension: Secondary | ICD-10-CM | POA: Diagnosis not present

## 2017-02-15 MED ORDER — ONDANSETRON HCL 4 MG PO TABS: 4.0000 mg | ORAL_TABLET | Freq: Three times a day (TID) | ORAL | 0 refills | Status: DC | PRN

## 2017-02-15 NOTE — Telephone Encounter (Signed)
Debbie aware to work in today

## 2017-02-15 NOTE — Patient Instructions (Addendum)
F/u in October, cancel August please  You are referred for colonoscopy  You have medication sent for nausea.  Use ibuprofen for your headache  REst and get sufficient fluids and food for light headedness  Work excuse to return on 005/16/2018  Thank you  for choosing [facility]. We consider it a privelige to serve you.  Delivering excellent health care in a caring and  compassionate way is our goal.  Partnering with you,  so that together we can achieve this goal is our strategy.

## 2017-02-15 NOTE — Telephone Encounter (Signed)
Patient's husband Cheyenne Harris calling because his wife has been having a headache for 4 days. She went on to work this morning, so this is why he is calling for her.  They have just come from a trip to [place].  She has not taken anything for it.  Please advise as he is asking if she can be seen today.

## 2017-02-16 DIAGNOSIS — T753XXA Motion sickness, initial encounter: Secondary | ICD-10-CM | POA: Insufficient documentation

## 2017-02-16 DIAGNOSIS — G4452 New daily persistent headache (NDPH): Secondary | ICD-10-CM | POA: Insufficient documentation

## 2017-02-16 NOTE — Assessment & Plan Note (Signed)
No focal deficits, h/o migraine, patient to use tylenol at home Work excuse for 1 day, to return 05/165/20187, no restrictions Zofran for associated nausea, and push fluids and rest

## 2017-02-16 NOTE — Progress Notes (Signed)
   Cheyenne Harris     MRN: [id number]      DOB: 1966-07-29   HPI Cheyenne Harris is here with a 4 day h/o pounding frontal headache , nausea and light headedness, just returningfrom vacation in [place] 4 days ago. Started feeling sick on flight home. Spouse reports nausea 2 days before Pt reports loose stools also. Denies fever or chills. Poor oral intake Spouse had to collect her from work today because she felt weak and light headed Does have h/o migraines, has taken no medication for headache. ROS  Denies sinus pressure, nasal congestion, ear pain or sore throat. Denies chest congestion, productive cough or wheezing. Denies chest pains, palpitations and leg swelling Denies dysuria, frequency, hesitancy or incontinence. Denies joint pain, swelling and limitation in mobility. Denies  seizures, numbness, or tingling. Denies depression, anxiety or insomnia. Denies skin break down or rash.   PE  BP 110/74   Pulse 80   Temp 98.1 F (36.7 C) (Oral)   Resp 16   Ht 5\' 4"  (1.626 m)   Wt 151 lb (68.5 kg)   LMP 10/11/2014 Comment: hysterectomy  SpO2 100%   BMI 25.92 kg/m   Patient alert and oriented and in no cardiopulmonary distress.  HEENT: No facial asymmetry, EOMI,   oropharynx pink and moist.  Neck supple no JVD, no mass.  Chest: Clear to auscultation bilaterally.  CVS: S1, S2 no murmurs, no S3.Regular rate.  ABD: Soft mild epigastric tenderness hyperactive bowel sounds   Ext: No edema  MS: Adequate ROM spine, shoulders, hips and knees.  Skin: Intact, no ulcerations or rash noted.  Psych: Good eye contact, normal affect. Memory intact not anxious or depressed appearing.  CNS: CN 2-12 intact, power,  normal throughout.no focal deficits noted.   Assessment & Plan  Headache No focal deficits, h/o migraine, patient to use tylenol at home Work excuse for 1 day, to return 05/165/20187, no restrictions Zofran for associated nausea, and push fluids and  rest  Nausea zofran as needed, push fluids and BRAT diet for mild GI distress, work excuse x 36 hours. Hand hygiene  Essential hypertension Controlled, no change in medication DASH diet and commitment to daily physical activity for a minimum of 30 minutes discussed and encouraged, as a part of hypertension management. The importance of attaining a healthy weight is also discussed.  BP/Weight 02/15/2017 11/10/2016 10/15/2016 12/12/2015 07/30/2015 05/23/2015 10/14/3157  Systolic BP 458 [phone]  Diastolic BP 74 84 76 80 90 90 100  Wt. (Lbs) 151 156 154.04 157.12 154.06 153.04 150  BMI 25.92 26.78 26.44 26.96 26.43 26.26 25.73

## 2017-02-16 NOTE — Assessment & Plan Note (Signed)
Controlled, no change in medication DASH diet and commitment to daily physical activity for a minimum of 30 minutes discussed and encouraged, as a part of hypertension management. The importance of attaining a healthy weight is also discussed.  BP/Weight 02/15/2017 11/10/2016 10/15/2016 12/12/2015 07/30/2015 05/23/2015 9/48/5462  Systolic BP 703 500 938 [phone]  Diastolic BP 74 84 76 80 90 90 100  Wt. (Lbs) 151 156 154.04 157.12 154.06 153.04 150  BMI 25.92 26.78 26.44 26.96 26.43 26.26 25.73

## 2017-02-16 NOTE — Assessment & Plan Note (Signed)
zofran as needed, push fluids and BRAT diet for mild GI distress, work excuse x 36 hours. Hand hygiene

## 2017-05-13 ENCOUNTER — Ambulatory Visit: Payer: 59 | Admitting: Family Medicine

## 2017-05-22 ENCOUNTER — Other Ambulatory Visit: Payer: Self-pay | Admitting: Family Medicine

## 2017-05-24 NOTE — Telephone Encounter (Signed)
Seen 5 14 18 

## 2017-06-22 ENCOUNTER — Other Ambulatory Visit: Payer: Self-pay | Admitting: Family Medicine

## 2017-06-22 MED ORDER — AMLODIPINE BESYLATE 5 MG PO TABS: 5.0000 mg | ORAL_TABLET | Freq: Every day | ORAL | 1 refills | Status: DC

## 2017-07-05 ENCOUNTER — Encounter: Payer: Self-pay | Admitting: Family Medicine

## 2017-07-05 ENCOUNTER — Ambulatory Visit (INDEPENDENT_AMBULATORY_CARE_PROVIDER_SITE_OTHER): Payer: 59 | Admitting: Family Medicine

## 2017-07-05 VITALS — BP 110/62 | HR 84 | Temp 97.6°F | Resp 16 | Ht 64.0 in | Wt 158.2 lb

## 2017-07-05 DIAGNOSIS — E785 Hyperlipidemia, unspecified: Secondary | ICD-10-CM

## 2017-07-05 DIAGNOSIS — I10 Essential (primary) hypertension: Secondary | ICD-10-CM

## 2017-07-05 DIAGNOSIS — R1013 Epigastric pain: Secondary | ICD-10-CM | POA: Diagnosis not present

## 2017-07-05 DIAGNOSIS — E663 Overweight: Secondary | ICD-10-CM

## 2017-07-05 DIAGNOSIS — Z789 Other specified health status: Secondary | ICD-10-CM

## 2017-07-05 DIAGNOSIS — M722 Plantar fascial fibromatosis: Secondary | ICD-10-CM | POA: Insufficient documentation

## 2017-07-05 DIAGNOSIS — Z2821 Immunization not carried out because of patient refusal: Secondary | ICD-10-CM

## 2017-07-05 MED ORDER — PREDNISONE 10 MG PO TABS: 10.0000 mg | ORAL_TABLET | Freq: Two times a day (BID) | ORAL | 0 refills | Status: DC

## 2017-07-05 MED ORDER — IBUPROFEN 800 MG PO TABS: 800.0000 mg | ORAL_TABLET | Freq: Three times a day (TID) | ORAL | 0 refills | Status: DC

## 2017-07-05 MED ORDER — RANITIDINE HCL 300 MG PO TABS: 300.0000 mg | ORAL_TABLET | Freq: Every day | ORAL | 0 refills | Status: DC

## 2017-07-05 NOTE — Progress Notes (Signed)
Cheyenne Harris     MRN: [id number]      DOB: 11/29/1965   HPI Cheyenne Harris is here for follow up and re-evaluation of chronic medical conditions, medication management and review of any available recent lab and radiology data.  Preventive health is updated, specifically  Cancer screening and Immunization.   Questions or concerns regarding consultations or procedures which the PT has had in the interim are  addressed. The PT denies any adverse reactions to current medications since the last visit.  Right foot pain x 3 months, worse after rest in instep Abdominal and RUQ pain with bloating and gas x 6 to 12 months  ROS Denies recent fever or chills. Denies sinus pressure, nasal congestion, ear pain or sore throat. Denies chest congestion, productive cough or wheezing. Denies chest pains, palpitations and leg swelling Denies  vomiting,diarrhea has chronic  constipation.   Denies dysuria, frequency, hesitancy or incontinence. . Denies headaches, seizures, numbness, or tingling. Denies depression, anxiety or insomnia. Denies skin break down or rash.   PE  BP 110/62 (BP Location: Left Arm, Patient Position: Sitting, Cuff Size: Normal)   Pulse 84   Temp 97.6 F (36.4 C) (Other (Comment))   Resp 16   Ht 5\' 4"  (1.626 m)   Wt 158 lb 4 oz (71.8 kg)   LMP 10/11/2014 Comment: hysterectomy  SpO2 96%   BMI 27.16 kg/m   Patient alert and oriented and in no cardiopulmonary distress.  HEENT: No facial asymmetry, EOMI,   oropharynx pink and moist.  Neck supple no JVD, no mass.  Chest: Clear to auscultation bilaterally.  CVS: S1, S2 no murmurs, no S3.Regular rate.  ABD: Soft tender in RUQ  Ext: No edema   MS: Adequate ROM spine, shoulders, hips and knees.tender over right instep  Skin: Intact, no ulcerations or rash noted.  Psych: Good eye contact, normal affect. Memory intact not anxious or depressed appearing.  CNS: CN 2-12 intact, power,  normal throughout.no focal deficits  noted.   Assessment & Plan Essential hypertension Controlled, no change in medication DASH diet and commitment to daily physical activity for a minimum of 30 minutes discussed and encouraged, as a part of hypertension management. The importance of attaining a healthy weight is also discussed.  BP/Weight 07/05/2017 02/15/2017 11/10/2016 10/15/2016 12/12/2015 07/30/2015 7/67/2094  Systolic BP 709 628 366 294 765 465 035  Diastolic BP 62 74 84 76 80 90 90  Wt. (Lbs) 158.25 151 156 154.04 157.12 154.06 153.04  BMI 27.16 25.92 26.78 26.44 26.96 26.43 26.26       Dyspepsia dyspepsia with RUQ tenderness  and nausea and bloating needs [place] to eval for gallstones  Plantar fasciitis, right Treat with 1 weel of anti inflammatory porally , local icing twice daily an stretches, refer to po diary if no effect, pt to call back  Overweight (BMI 25.0-29.9) Deteriorated. Patient re-educated about  the importance of commitment to a  minimum of 150 minutes of exercise per week.  The importance of healthy food choices with portion control discussed. Encouraged to start a food diary, count calories and to consider  joining a support group. Sample diet sheets offered. Goals set by the patient for the next several months.   Weight /BMI 07/05/2017 02/15/2017 11/10/2016  WEIGHT 158 lb 4 oz 151 lb 156 lb  HEIGHT 5\' 4"  5\' 4"  5\' 4"   BMI 27.16 kg/m2 25.92 kg/m2 26.78 kg/m2      Hyperlipidemia LDL goal <100 Hyperlipidemia:Low fat diet discussed  and encouraged.   Lipid Panel  Lab Results  Component Value Date   CHOL 185 07/05/2017   HDL 86 07/05/2017   LDLCALC 93 11/19/2016   TRIG 51 07/05/2017   CHOLHDL 2.2 07/05/2017   Controlled, no change in medication     Refused H1N1 influenza virus immunization Re educated and still refuses  vaccine

## 2017-07-05 NOTE — Patient Instructions (Addendum)
Please cancel appt next week  Reschedule for 5.5 months  Reconsider flu vaccine  You are referred for [place] of gall bladder as they may be the cause of abdominal pain and bloating, message willl be left on cell re appt re appointment  You are treated for Plantar fascitis of right foot, inflammation of tendons in foot.If no success call for referral to Podiatry, 3 meds are sent in today  Fasting lipid, cmp and HIV asap  Pls sched gyne appt next Februaury  It is important that you exercise regularly at least 30 minutes 5 times a week. If you develop chest pain, have severe difficulty breathing, or feel very tired, stop exercising immediately and seek medical attention    Pls call and arramge colonoscopy , PAST due  It is important that you exercise regularly at least 30 minutes 5 times a week. If you develop chest pain, have severe difficulty breathing, or feel very tired, stop exercising immediately and seek medical attention \     Plantar Fasciitis Plantar fasciitis is a painful foot condition that affects the heel. It occurs when the band of tissue that connects the toes to the heel bone (plantar fascia) becomes irritated. This can happen after exercising too much or doing other repetitive activities (overuse injury). The pain from plantar fasciitis can range from mild irritation to severe pain that makes it difficult for you to walk or move. The pain is usually worse in the morning or after you have been sitting or lying down for a while. What are the causes? This condition may be caused by:  Standing for long periods of time.  Wearing shoes that do not fit.  Doing high-impact activities, including running, aerobics, and ballet.  Being overweight.  Having an abnormal way of walking (gait).  Having tight calf muscles.  Having high arches in your feet.  Starting a new athletic activity.  What are the signs or symptoms? The main symptom of this condition is heel pain. Other  symptoms include:  Pain that gets worse after activity or exercise.  Pain that is worse in the morning or after resting.  Pain that goes away after you walk for a few minutes.  How is this diagnosed? This condition may be diagnosed based on your signs and symptoms. Your health care provider will also do a physical exam to check for:  A tender area on the bottom of your foot.  A high arch in your foot.  Pain when you move your foot.  Difficulty moving your foot.  You may also need to have imaging studies to confirm the diagnosis. These can include:  X-rays.  Ultrasound.  MRI.  How is this treated? Treatment for plantar fasciitis depends on the severity of the condition. Your treatment may include:  Rest, ice, and over-the-counter pain medicines to manage your pain.  Exercises to stretch your calves and your plantar fascia.  A splint that holds your foot in a stretched, upward position while you sleep (night splint).  Physical therapy to relieve symptoms and prevent problems in the future.  Cortisone injections to relieve severe pain.  Extracorporeal shock wave therapy (ESWT) to stimulate damaged plantar fascia with electrical impulses. It is often used as a last resort before surgery.  Surgery, if other treatments have not worked after 12 months.  Follow these instructions at home:  Take medicines only as directed by your health care provider.  Avoid activities that cause pain.  Roll the bottom of your foot over  a bag of ice or a bottle of cold water. Do this for 20 minutes, 3-4 times a day.  Perform simple stretches as directed by your health care provider.  Try wearing athletic shoes with air-sole or gel-sole cushions or soft shoe inserts.  Wear a night splint while sleeping, if directed by your health care provider.  Keep all follow-up appointments with your health care provider. How is this prevented?  Do not perform exercises or activities that cause  heel pain.  Consider finding low-impact activities if you continue to have problems.  Lose weight if you need to. The best way to prevent plantar fasciitis is to avoid the activities that aggravate your plantar fascia. Contact a health care provider if:  Your symptoms do not go away after treatment with home care measures.  Your pain gets worse.  Your pain affects your ability to move or do your daily activities. This information is not intended to replace advice given to you by your health care provider. Make sure you discuss any questions you have with your health care provider. Document Released: 06/16/2001 Document Revised: 02/24/2016 Document Reviewed: 08/01/2014 Elsevier Interactive Patient Education  [employer].

## 2017-07-06 ENCOUNTER — Encounter: Payer: Self-pay | Admitting: Family Medicine

## 2017-07-06 DIAGNOSIS — Z2821 Immunization not carried out because of patient refusal: Secondary | ICD-10-CM | POA: Insufficient documentation

## 2017-07-06 LAB — COMPLETE METABOLIC PANEL WITH GFR
AG RATIO: 1.5 (calc) (ref 1.0–2.5)
ALT: 19 U/L (ref 6–29)
AST: 20 U/L (ref 10–35)
Albumin: 4.3 g/dL (ref 3.6–5.1)
Alkaline phosphatase (APISO): 85 U/L (ref 33–130)
BILIRUBIN TOTAL: 0.5 mg/dL (ref 0.2–1.2)
BUN: 17 mg/dL (ref 7–25)
CALCIUM: 9.7 mg/dL (ref 8.6–10.4)
CHLORIDE: 104 mmol/L (ref 98–110)
CO2: 27 mmol/L (ref 20–32)
Creat: 0.92 mg/dL (ref 0.50–1.05)
GFR, EST AFRICAN AMERICAN: 84 mL/min/{1.73_m2} (ref >=60)
GFR, EST NON AFRICAN AMERICAN: 73 mL/min/{1.73_m2} (ref >=60)
GLOBULIN: 2.9 g/dL (ref 1.9–3.7)
Glucose, Bld: 89 mg/dL (ref 65–139)
POTASSIUM: 3.7 mmol/L (ref 3.5–5.3)
Sodium: 140 mmol/L (ref 135–146)
TOTAL PROTEIN: 7.2 g/dL (ref 6.1–8.1)

## 2017-07-06 LAB — LIPID PANEL
CHOL/HDL RATIO: 2.2 (calc) (ref <5.0)
CHOLESTEROL: 185 mg/dL (ref <200)
HDL: 86 mg/dL (ref >50)
LDL Cholesterol (Calc): 86 mg/dL (calc)
NON-HDL CHOLESTEROL (CALC): 99 mg/dL (ref <130)
TRIGLYCERIDES: 51 mg/dL (ref <150)

## 2017-07-06 LAB — HIV ANTIBODY (ROUTINE TESTING W REFLEX): HIV 1&2 Ab, 4th Generation: NONREACTIVE

## 2017-07-06 NOTE — Assessment & Plan Note (Signed)
Deteriorated. Patient re-educated about  the importance of commitment to a  minimum of 150 minutes of exercise per week.  The importance of healthy food choices with portion control discussed. Encouraged to start a food diary, count calories and to consider  joining a support group. Sample diet sheets offered. Goals set by the patient for the next several months.   Weight /BMI 07/05/2017 02/15/2017 11/10/2016  WEIGHT 158 lb 4 oz 151 lb 156 lb  HEIGHT 5\' 4"  5\' 4"  5\' 4"   BMI 27.16 kg/m2 25.92 kg/m2 26.78 kg/m2

## 2017-07-06 NOTE — Assessment & Plan Note (Signed)
dyspepsia with RUQ tenderness  and nausea and bloating needs [place] to eval for gallstones

## 2017-07-06 NOTE — Assessment & Plan Note (Signed)
Controlled, no change in medication DASH diet and commitment to daily physical activity for a minimum of 30 minutes discussed and encouraged, as a part of hypertension management. The importance of attaining a healthy weight is also discussed.  BP/Weight 07/05/2017 02/15/2017 11/10/2016 10/15/2016 12/12/2015 07/30/2015 7/35/3299  Systolic BP 242 683 419 622 297 989 211  Diastolic BP 62 74 84 76 80 90 90  Wt. (Lbs) 158.25 151 156 154.04 157.12 154.06 153.04  BMI 27.16 25.92 26.78 26.44 26.96 26.43 26.26

## 2017-07-06 NOTE — Assessment & Plan Note (Signed)
Treat with 1 weel of anti inflammatory porally , local icing twice daily an stretches, refer to po diary if no effect, pt to call back

## 2017-07-06 NOTE — Assessment & Plan Note (Signed)
Re educated and still refuses  vaccine

## 2017-07-06 NOTE — Assessment & Plan Note (Signed)
Hyperlipidemia:Low fat diet discussed and encouraged.   Lipid Panel  Lab Results  Component Value Date   CHOL 185 07/05/2017   HDL 86 07/05/2017   LDLCALC 93 11/19/2016   TRIG 51 07/05/2017   CHOLHDL 2.2 07/05/2017   Controlled, no change in medication

## 2017-07-08 ENCOUNTER — Ambulatory Visit (HOSPITAL_COMMUNITY)
Admission: RE | Admit: 2017-07-08 | Discharge: 2017-07-08 | Disposition: A | Discharge status: Home / Self Care | Payer: 59 | Source: Ambulatory Visit | Attending: Family Medicine | Admitting: Family Medicine

## 2017-07-08 DIAGNOSIS — R1013 Epigastric pain: Secondary | ICD-10-CM | POA: Diagnosis present

## 2017-07-08 DIAGNOSIS — M722 Plantar fascial fibromatosis: Secondary | ICD-10-CM

## 2017-07-08 DIAGNOSIS — I10 Essential (primary) hypertension: Secondary | ICD-10-CM | POA: Diagnosis not present

## 2017-07-08 DIAGNOSIS — E785 Hyperlipidemia, unspecified: Secondary | ICD-10-CM | POA: Diagnosis not present

## 2017-07-08 DIAGNOSIS — R109 Unspecified abdominal pain: Secondary | ICD-10-CM | POA: Diagnosis not present

## 2017-07-09 ENCOUNTER — Encounter: Payer: Self-pay | Admitting: Family Medicine

## 2017-07-14 ENCOUNTER — Ambulatory Visit: Payer: 59 | Admitting: Family Medicine

## 2017-07-29 DIAGNOSIS — A881 Epidemic vertigo: Secondary | ICD-10-CM | POA: Diagnosis not present

## 2017-11-23 ENCOUNTER — Other Ambulatory Visit: Payer: Self-pay | Admitting: Family Medicine

## 2017-12-06 ENCOUNTER — Other Ambulatory Visit: Payer: Self-pay | Admitting: Family Medicine

## 2017-12-06 DIAGNOSIS — Z1231 Encounter for screening mammogram for malignant neoplasm of breast: Secondary | ICD-10-CM

## 2017-12-08 ENCOUNTER — Ambulatory Visit (HOSPITAL_COMMUNITY)
Admission: RE | Admit: 2017-12-08 | Discharge: 2017-12-08 | Disposition: A | Discharge status: Home / Self Care | Payer: 59 | Source: Ambulatory Visit | Attending: Family Medicine | Admitting: Family Medicine

## 2017-12-08 DIAGNOSIS — Z1231 Encounter for screening mammogram for malignant neoplasm of breast: Secondary | ICD-10-CM | POA: Diagnosis not present

## 2017-12-12 ENCOUNTER — Other Ambulatory Visit: Payer: Self-pay | Admitting: Family Medicine

## 2017-12-13 ENCOUNTER — Ambulatory Visit: Payer: 59 | Admitting: Family Medicine

## 2017-12-13 ENCOUNTER — Encounter: Payer: Self-pay | Admitting: Family Medicine

## 2017-12-13 VITALS — BP 122/82 | HR 72 | Temp 99.3°F | Resp 16 | Ht 64.0 in | Wt 150.4 lb

## 2017-12-13 DIAGNOSIS — J111 Influenza due to unidentified influenza virus with other respiratory manifestations: Secondary | ICD-10-CM | POA: Diagnosis not present

## 2017-12-13 DIAGNOSIS — I10 Essential (primary) hypertension: Secondary | ICD-10-CM | POA: Diagnosis not present

## 2017-12-13 DIAGNOSIS — E663 Overweight: Secondary | ICD-10-CM | POA: Diagnosis not present

## 2017-12-13 DIAGNOSIS — J0100 Acute maxillary sinusitis, unspecified: Secondary | ICD-10-CM

## 2017-12-13 DIAGNOSIS — T753XXA Motion sickness, initial encounter: Secondary | ICD-10-CM

## 2017-12-13 DIAGNOSIS — E785 Hyperlipidemia, unspecified: Secondary | ICD-10-CM

## 2017-12-13 MED ORDER — OSELTAMIVIR PHOSPHATE 75 MG PO CAPS: 75.0000 mg | ORAL_CAPSULE | Freq: Two times a day (BID) | ORAL | 0 refills | Status: DC

## 2017-12-13 MED ORDER — FLUCONAZOLE 150 MG PO TABS: 150.0000 mg | ORAL_TABLET | Freq: Once | ORAL | 0 refills | Status: AC

## 2017-12-13 MED ORDER — SCOPOLAMINE 1 MG/3DAYS TD PT72: 1.0000 | MEDICATED_PATCH | TRANSDERMAL | 0 refills | Status: DC

## 2017-12-13 MED ORDER — AZITHROMYCIN 250 MG PO TABS
ORAL_TABLET | ORAL | 0 refills | Status: DC
Start: 2017-12-13 — End: 2018-01-14

## 2017-12-13 NOTE — Progress Notes (Signed)
   Cheyenne Harris     MRN: [id number]      DOB: 03-27-1966   HPI [name]1 day h/o acute onset of sore throat , body aches, fatigue , sinus drainage headache and fever, low grade x 2 days. Here for her scheduled visit , however became acutely ill yesterday.Prior to this she had been well. Requests  medication for motion sickness as plans to cruise next month  ROS Denies chest congestion, productive cough or wheezing. Denies chest pains, palpitations and leg swelling Denies abdominal pain, nausea, vomiting,diarrhea or constipation.   Denies dysuria, frequency, hesitancy or incontinence. Denies joint pain, swelling and limitation in mobility. Denies headaches, seizures, numbness, or tingling. Denies depression, anxiety or insomnia. Denies skin break down or rash.   PE  BP 122/82   Pulse 72   Temp 99.3 F (37.4 C) (Oral)   Resp 16   Ht 5\' 4"  (1.626 m)   Wt 150 lb 6.4 oz (68.2 kg)   LMP 10/11/2014 Comment: hysterectomy  SpO2 98%   BMI 25.82 kg/m   Patient alert and oriented and in no cardiopulmonary distress. Ill appearing HEENT: No facial asymmetry, EOMI,   oropharynx pink and moist.  Neck supple no JVD, no mass.Maxillary sinus tenderness, oropharynx mildly erythematous, no exudate, anterior cervical adenitis  Chest: Clear to auscultation bilaterally.  CVS: S1, S2 no murmurs, no S3.Regular rate.  ABD: Soft non tender.   Ext: No edema  MS: Adequate ROM spine, shoulders, hips and knees.  Skin: Intact, no ulcerations or rash noted.  Psych: Good eye contact, normal affect. Memory intact not anxious or depressed appearing.  CNS: CN 2-12 intact, power,  normal throughout.no focal deficits noted.   Assessment & Plan  Acute maxillary sinusitis Z pack prescribed and work excuse x 48 hours  Influenza Tamiflu x 5 days prescribed and 48 hour work excuse  Essential hypertension Controlled, no change in medication DASH diet and commitment to daily physical activity for  a minimum of 30 minutes discussed and encouraged, as a part of hypertension management. The importance of attaining a healthy weight is also discussed.  BP/Weight 12/13/2017 07/05/2017 02/15/2017 11/10/2016 10/15/2016 12/12/2015 70/35/0093  Systolic BP 818 299 371 696 789 381 017  Diastolic BP 82 62 74 84 76 80 90  Wt. (Lbs) 150.4 158.25 151 156 154.04 157.12 154.06  BMI 25.82 27.16 25.92 26.78 26.44 26.96 26.43       Hyperlipidemia LDL goal <100 Updated lab needed , controlled when last checked Hyperlipidemia:Low fat diet discussed and encouraged.   Lipid Panel  Lab Results  Component Value Date   CHOL 185 07/05/2017   HDL 86 07/05/2017   LDLCALC 86 07/05/2017   TRIG 51 07/05/2017   CHOLHDL 2.2 07/05/2017       Overweight (BMI 25.0-29.9) Improved, pt congratulated on this.    Weight /BMI 12/13/2017 07/05/2017 02/15/2017  WEIGHT 150 lb 6.4 oz 158 lb 4 oz 151 lb  HEIGHT 5\' 4"  5\' 4"  5\' 4"   BMI 25.82 kg/m2 27.16 kg/m2 25.92 kg/m2

## 2017-12-13 NOTE — Patient Instructions (Addendum)
F/u in 5 month, call if you need me before  You are being treated for sinusitis and influenza   Work excuse to return on 12/15/2017  CBC, vit D, fasting lipid, cmp and eGFR and TSH in next 1 week  Medication also sent for motion sickness

## 2017-12-15 ENCOUNTER — Telehealth: Payer: Self-pay | Admitting: Family Medicine

## 2017-12-15 DIAGNOSIS — I10 Essential (primary) hypertension: Secondary | ICD-10-CM | POA: Diagnosis not present

## 2017-12-15 DIAGNOSIS — E785 Hyperlipidemia, unspecified: Secondary | ICD-10-CM

## 2017-12-15 NOTE — Telephone Encounter (Signed)
Labs ordered as indicated by AVS printed 12/13/2017

## 2017-12-15 NOTE — Assessment & Plan Note (Signed)
Improved, pt congratulated on this.    Weight /BMI 12/13/2017 07/05/2017 02/15/2017  WEIGHT 150 lb 6.4 oz 158 lb 4 oz 151 lb  HEIGHT 5\' 4"  5\' 4"  5\' 4"   BMI 25.82 kg/m2 27.16 kg/m2 25.92 kg/m2

## 2017-12-15 NOTE — Assessment & Plan Note (Signed)
Controlled, no change in medication DASH diet and commitment to daily physical activity for a minimum of 30 minutes discussed and encouraged, as a part of hypertension management. The importance of attaining a healthy weight is also discussed.  BP/Weight 12/13/2017 07/05/2017 02/15/2017 11/10/2016 10/15/2016 12/12/2015 42/87/6811  Systolic BP 572 620 355 974 163 845 364  Diastolic BP 82 62 74 84 76 80 90  Wt. (Lbs) 150.4 158.25 151 156 154.04 157.12 154.06  BMI 25.82 27.16 25.92 26.78 26.44 26.96 26.43

## 2017-12-15 NOTE — Assessment & Plan Note (Signed)
Tamiflu x 5 days prescribed and 48 hour work excuse

## 2017-12-15 NOTE — Assessment & Plan Note (Addendum)
Z pack prescribed and work excuse x 48 hours

## 2017-12-15 NOTE — Assessment & Plan Note (Signed)
Updated lab needed , controlled when last checked Hyperlipidemia:Low fat diet discussed and encouraged.   Lipid Panel  Lab Results  Component Value Date   CHOL 185 07/05/2017   HDL 86 07/05/2017   LDLCALC 86 07/05/2017   TRIG 51 07/05/2017   CHOLHDL 2.2 07/05/2017

## 2017-12-15 NOTE — Assessment & Plan Note (Signed)
Upcoming cruise, trans scopolamine prescribed

## 2017-12-16 ENCOUNTER — Encounter: Payer: Self-pay | Admitting: Family Medicine

## 2017-12-16 LAB — COMPLETE METABOLIC PANEL WITH GFR
AG Ratio: 1.3 (calc) (ref 1.0–2.5)
ALKALINE PHOSPHATASE (APISO): 84 U/L (ref 33–130)
ALT: 13 U/L (ref 6–29)
AST: 14 U/L (ref 10–35)
Albumin: 3.9 g/dL (ref 3.6–5.1)
BUN: 11 mg/dL (ref 7–25)
CO2: 28 mmol/L (ref 20–32)
CREATININE: 0.85 mg/dL (ref 0.50–1.05)
Calcium: 9.5 mg/dL (ref 8.6–10.4)
Chloride: 109 mmol/L (ref 98–110)
GFR, Est African American: 93 mL/min/{1.73_m2} (ref >=60)
GFR, Est Non African American: 80 mL/min/{1.73_m2} (ref >=60)
Globulin: 2.9 g/dL (calc) (ref 1.9–3.7)
Glucose, Bld: 95 mg/dL (ref 65–99)
Potassium: 4 mmol/L (ref 3.5–5.3)
Sodium: 143 mmol/L (ref 135–146)
Total Bilirubin: 0.4 mg/dL (ref 0.2–1.2)
Total Protein: 6.8 g/dL (ref 6.1–8.1)

## 2017-12-16 LAB — CBC
HCT: 38.3 % (ref 35.0–45.0)
Hemoglobin: 13.1 g/dL (ref 11.7–15.5)
MCH: 29 pg (ref 27.0–33.0)
MCHC: 34.2 g/dL (ref 32.0–36.0)
MCV: 84.7 fL (ref 80.0–100.0)
MPV: 12.9 fL — ABNORMAL HIGH (ref 7.5–12.5)
Platelets: 183 10*3/uL (ref 140–400)
RBC: 4.52 10*6/uL (ref 3.80–5.10)
RDW: 12.3 % (ref 11.0–15.0)
WBC: 4.8 10*3/uL (ref 3.8–10.8)

## 2017-12-16 LAB — LIPID PANEL
Cholesterol: 155 mg/dL (ref <200)
HDL: 61 mg/dL (ref >50)
LDL CHOLESTEROL (CALC): 79 mg/dL
Non-HDL Cholesterol (Calc): 94 mg/dL (calc) (ref <130)
Total CHOL/HDL Ratio: 2.5 (calc) (ref <5.0)
Triglycerides: 66 mg/dL (ref <150)

## 2017-12-16 LAB — VITAMIN D 25 HYDROXY (VIT D DEFICIENCY, FRACTURES): VIT D 25 HYDROXY: 53 ng/mL (ref 30–100)

## 2017-12-16 LAB — TSH: TSH: 1.3 m[IU]/L

## 2017-12-20 ENCOUNTER — Other Ambulatory Visit: Payer: 59 | Admitting: Obstetrics and Gynecology

## 2017-12-30 ENCOUNTER — Other Ambulatory Visit (INDEPENDENT_AMBULATORY_CARE_PROVIDER_SITE_OTHER): Payer: Self-pay | Admitting: *Deleted

## 2017-12-30 DIAGNOSIS — Z0000 Encounter for general adult medical examination without abnormal findings: Secondary | ICD-10-CM | POA: Insufficient documentation

## 2017-12-30 DIAGNOSIS — Z09 Encounter for follow-up examination after completed treatment for conditions other than malignant neoplasm: Secondary | ICD-10-CM | POA: Insufficient documentation

## 2017-12-30 DIAGNOSIS — Z1211 Encounter for screening for malignant neoplasm of colon: Secondary | ICD-10-CM

## 2018-01-14 ENCOUNTER — Ambulatory Visit (INDEPENDENT_AMBULATORY_CARE_PROVIDER_SITE_OTHER): Payer: 59 | Admitting: Obstetrics and Gynecology

## 2018-01-14 ENCOUNTER — Encounter: Payer: Self-pay | Admitting: Obstetrics and Gynecology

## 2018-01-14 ENCOUNTER — Other Ambulatory Visit (HOSPITAL_COMMUNITY)
Admission: RE | Admit: 2018-01-14 | Discharge: 2018-01-14 | Disposition: A | Discharge status: Home / Self Care | Payer: 59 | Source: Ambulatory Visit | Attending: Obstetrics and Gynecology | Admitting: Obstetrics and Gynecology

## 2018-01-14 ENCOUNTER — Other Ambulatory Visit: Payer: Self-pay

## 2018-01-14 VITALS — BP 120/76 | HR 68 | Ht 64.0 in | Wt 152.0 lb

## 2018-01-14 DIAGNOSIS — R350 Frequency of micturition: Secondary | ICD-10-CM | POA: Diagnosis not present

## 2018-01-14 DIAGNOSIS — Z1212 Encounter for screening for malignant neoplasm of rectum: Secondary | ICD-10-CM | POA: Diagnosis not present

## 2018-01-14 DIAGNOSIS — Z01411 Encounter for gynecological examination (general) (routine) with abnormal findings: Secondary | ICD-10-CM

## 2018-01-14 DIAGNOSIS — Z1211 Encounter for screening for malignant neoplasm of colon: Secondary | ICD-10-CM | POA: Diagnosis not present

## 2018-01-14 DIAGNOSIS — Z01419 Encounter for gynecological examination (general) (routine) without abnormal findings: Secondary | ICD-10-CM | POA: Insufficient documentation

## 2018-01-14 LAB — HEMOCCULT GUIAC POC 1CARD (OFFICE): Fecal Occult Blood, POC: NEGATIVE

## 2018-01-14 NOTE — Progress Notes (Signed)
Assessment:  Annual Gyn Exam  Plan:  1. pap smear completed today. Next pap smear in 5 years 2. return prn 3    Annual mammogram advised after age 51 Subjective:  Cheyenne Harris is a 52 y.o. female G2P2 who presents for annual exam. Patient's last menstrual period was 10/11/2014. The patient has complaints today of urinary frequency, that is tolerable. She presents with her spouse today. Her last screening mammogram was on 12/08/2017 with results showing: No mammographic evidence of malignancy. She has a colonoscopy scheduled in June 2019. She has her blood work checked by Dr. Moshe Cipro  The following portions of the patient's history were reviewed and updated as appropriate: allergies, current medications, past family history, past medical history, past social history, past surgical history and problem list. Past Medical History:  Diagnosis Date  . Complication of anesthesia   . Hyperlipidemia   . Hypertension   . PONV (postoperative nausea and vomiting)     Past Surgical History:  Procedure Laterality Date  . ABDOMINAL HYSTERECTOMY    . BILATERAL SALPINGECTOMY Bilateral 03/12/2015   Procedure: BILATERAL SALPINGECTOMY;  Surgeon: Jonnie Kind, MD;  Location: [facility];  Service: Gynecology;  Laterality: Bilateral;  . SUPRACERVICAL ABDOMINAL HYSTERECTOMY N/A 03/12/2015   Procedure: HYSTERECTOMY SUPRACERVICAL ABDOMINAL;  Surgeon: Jonnie Kind, MD;  Location: [facility];  Service: Gynecology;  Laterality: N/A;  . WISDOM TOOTH EXTRACTION       Current Outpatient Medications:  .  amLODipine (NORVASC) 5 MG tablet, TAKE 1 TABLET BY MOUTH EVERY DAY, Disp: 90 tablet, Rfl: 1 .  KRILL OIL PO, Take 1 capsule by mouth daily., Disp: , Rfl:  .  loratadine (CLARITIN) 10 MG tablet, Take 10 mg by mouth daily., Disp: , Rfl:  .  Multiple Vitamins-Minerals (MULTIVITAMIN WITH MINERALS) tablet, Take 1 tablet by mouth daily., Disp: , Rfl:  .  Probiotic Product (PROBIOTIC PO), Take 1 tablet by mouth daily.,  Disp: , Rfl:  .  rosuvastatin (CRESTOR) 5 MG tablet, TAKE 1 TABLET BY MOUTH EVERY DAY, Disp: 90 tablet, Rfl: 1 .  estradiol (VIVELLE-DOT) 0.1 MG/24HR patch, PLACE 1 PATCH (0.1 MG TOTAL) ONTO THE SKIN 2 (TWO) TIMES A WEEK. (Patient not taking: Reported on 01/14/2018), Disp: 8 patch, Rfl: 8  Review of Systems Constitutional: negative Gastrointestinal: negative Genitourinary: +urinary frequency  Objective:  BP 120/76 (BP Location: Right Arm, Patient Position: Sitting, Cuff Size: Normal)   Pulse 68   Ht 5\' 4"  (1.626 m)   Wt 152 lb (68.9 kg)   LMP 10/11/2014 Comment: hysterectomy  BMI 26.09 kg/m    BMI: Body mass index is 26.09 kg/m.  General Appearance: Alert, appropriate appearance for age. No acute distress HEENT: Grossly normal Neck / Thyroid:  Cardiovascular: RRR; normal S1, S2, no murmur Lungs: CTA bilaterally Back: No CVAT Breast Exam: No masses or nodes.No dimpling, nipple retraction or discharge. Gastrointestinal: Soft, non-tender, no masses or organomegaly Pelvic Exam: Vaginal: normal mucosa without prolapse or lesions, normal without tenderness, induration or masses and normal rugae Cervix: normal appearance Great support, tiny, small mobile, atrophic, non-tender. Rectovaginal: negative without mass, lesions or tenderness, sphincter tone normal, stool guaiac negative. Lymphatic Exam: Non-palpable nodes in neck, clavicular, axillary, or inguinal regions  Skin: no rash or abnormalities Neurologic: Normal gait and speech, no tremor  Psychiatric: Alert and oriented, appropriate affect.  Urinalysis:Not done  Mallory Shirk. MD Pgr [phone] 11:44 AM   By signing my name below, I, Cheyenne Harris, attest that this documentation has been prepared  under the direction and in the presence of Jonnie Kind, MD. Electronically Signed: Soijett Harris, ED Scribe. 01/14/18. 11:44 AM.  I personally performed the services described in this documentation, which was SCRIBED in my  presence. The recorded information has been reviewed and considered accurate. It has been edited as necessary during review. Jonnie Kind, MD

## 2018-01-18 LAB — CYTOLOGY - PAP
DIAGNOSIS: NEGATIVE
HPV: NOT DETECTED

## 2018-03-07 ENCOUNTER — Telehealth (INDEPENDENT_AMBULATORY_CARE_PROVIDER_SITE_OTHER): Payer: Self-pay | Admitting: *Deleted

## 2018-03-07 ENCOUNTER — Encounter (INDEPENDENT_AMBULATORY_CARE_PROVIDER_SITE_OTHER): Payer: Self-pay | Admitting: *Deleted

## 2018-03-07 MED ORDER — PEG 3350-KCL-NA BICARB-NACL 420 G PO SOLR: 4000.0000 mL | Freq: Once | ORAL | 0 refills | Status: AC

## 2018-03-07 NOTE — Telephone Encounter (Signed)
Patient needs trilyte 

## 2018-03-22 ENCOUNTER — Telehealth (INDEPENDENT_AMBULATORY_CARE_PROVIDER_SITE_OTHER): Payer: Self-pay | Admitting: *Deleted

## 2018-03-22 NOTE — Telephone Encounter (Signed)
Referring MD/PCP: simpson   Procedure: tcs  Reason/Indication:  screening  Has patient had this procedure before?  no  If so, when, by whom and where?    Is there a family history of colon cancer?  no  Who?  What age when diagnosed?    Is patient diabetic?   no      Does patient have prosthetic heart valve or mechanical valve?  no  Do you have a pacemaker?  no  Has patient ever had endocarditis? no  Has patient had joint replacement within last 12 months?  no  Is patient constipated or do they take laxatives? no  Does patient have a history of alcohol/drug use?  no  Is patient on blood thinner such as Coumadin, Plavix and/or Aspirin? no  Medications: crestor 5 mg daily, norvasc 5 mg daily, motrin prn, Claritin 10 mg prn, mvi daily, probiotic daily, krill oil daily   Allergies: sulfur  Medication Adjustment per Dr Lindi Adie, NP:   Procedure date & time: 04/13/18 at 730

## 2018-03-22 NOTE — Telephone Encounter (Signed)
agree

## 2018-04-13 ENCOUNTER — Encounter (HOSPITAL_COMMUNITY)
Admission: RE | Disposition: A | Discharge status: Home / Self Care | Payer: Self-pay | Source: Ambulatory Visit | Attending: Internal Medicine

## 2018-04-13 ENCOUNTER — Ambulatory Visit (HOSPITAL_COMMUNITY)
Admission: RE | Admit: 2018-04-13 | Discharge: 2018-04-13 | Disposition: A | Discharge status: Home / Self Care | Payer: 59 | Source: Ambulatory Visit | Attending: Internal Medicine | Admitting: Internal Medicine

## 2018-04-13 ENCOUNTER — Other Ambulatory Visit: Payer: Self-pay

## 2018-04-13 ENCOUNTER — Encounter (HOSPITAL_COMMUNITY): Payer: Self-pay

## 2018-04-13 DIAGNOSIS — I10 Essential (primary) hypertension: Secondary | ICD-10-CM | POA: Diagnosis not present

## 2018-04-13 DIAGNOSIS — E785 Hyperlipidemia, unspecified: Secondary | ICD-10-CM | POA: Diagnosis not present

## 2018-04-13 DIAGNOSIS — Z79899 Other long term (current) drug therapy: Secondary | ICD-10-CM | POA: Diagnosis not present

## 2018-04-13 DIAGNOSIS — K6389 Other specified diseases of intestine: Secondary | ICD-10-CM | POA: Diagnosis not present

## 2018-04-13 DIAGNOSIS — Z1211 Encounter for screening for malignant neoplasm of colon: Secondary | ICD-10-CM | POA: Diagnosis not present

## 2018-04-13 DIAGNOSIS — Z09 Encounter for follow-up examination after completed treatment for conditions other than malignant neoplasm: Secondary | ICD-10-CM | POA: Insufficient documentation

## 2018-04-13 DIAGNOSIS — Z882 Allergy status to sulfonamides status: Secondary | ICD-10-CM | POA: Diagnosis not present

## 2018-04-13 DIAGNOSIS — K648 Other hemorrhoids: Secondary | ICD-10-CM | POA: Diagnosis not present

## 2018-04-13 DIAGNOSIS — Z0000 Encounter for general adult medical examination without abnormal findings: Secondary | ICD-10-CM | POA: Insufficient documentation

## 2018-04-13 HISTORY — PX: COLONOSCOPY: SHX5424

## 2018-04-13 SURGERY — COLONOSCOPY
Anesthesia: Moderate Sedation

## 2018-04-13 MED ORDER — MEPERIDINE HCL 50 MG/ML IJ SOLN
INTRAMUSCULAR | Status: DC | PRN
  Administered 2018-04-13 (×2): 25 mg

## 2018-04-13 MED ORDER — MIDAZOLAM HCL 5 MG/5ML IJ SOLN
INTRAMUSCULAR | Status: AC
  Filled 2018-04-13: qty 10

## 2018-04-13 MED ORDER — MIDAZOLAM HCL 5 MG/5ML IJ SOLN
INTRAMUSCULAR | Status: DC | PRN
  Administered 2018-04-13: 1 mg via INTRAVENOUS
  Administered 2018-04-13 (×3): 2 mg via INTRAVENOUS

## 2018-04-13 MED ORDER — SODIUM CHLORIDE 0.9 % IV SOLN
INTRAVENOUS | Status: DC
  Administered 2018-04-13: 07:00:00 via INTRAVENOUS

## 2018-04-13 MED ORDER — MEPERIDINE HCL 50 MG/ML IJ SOLN
INTRAMUSCULAR | Status: AC
  Filled 2018-04-13: qty 1

## 2018-04-13 NOTE — H&P (Signed)
Cheyenne Harris is an 52 y.o. female.   Chief Complaint: Patient is here for colonoscopy. HPI: Patient is a 52 year old Afro-American female who is here for screening colonoscopy.  She denies abdominal pain or rectal bleeding.  She is prone to chronic constipation.  She has 1-2 bowel movements per week which has been her pattern for years. Family history is negative for CRC.  Past Medical History:  Diagnosis Date  .    Marland Kitchen Hyperlipidemia   . Hypertension   . PONV (postoperative nausea and vomiting)     Past Surgical History:  Procedure Laterality Date  . ABDOMINAL HYSTERECTOMY    . BILATERAL SALPINGECTOMY Bilateral 03/12/2015   Procedure: BILATERAL SALPINGECTOMY;  Surgeon: Jonnie Kind, MD;  Location: [facility];  Service: Gynecology;  Laterality: Bilateral;  . SUPRACERVICAL ABDOMINAL HYSTERECTOMY N/A 03/12/2015   Procedure: HYSTERECTOMY SUPRACERVICAL ABDOMINAL;  Surgeon: Jonnie Kind, MD;  Location: [facility];  Service: Gynecology;  Laterality: N/A;  . WISDOM TOOTH EXTRACTION      Family History  Problem Relation Age of Onset  . Hypertension Mother   . Hypertension Sister   . Hypertension Brother   . Diabetes Maternal Grandmother   . Heart disease Maternal Grandmother   . Hypertension Sister    Social History:  reports that she has never smoked. She has never used smokeless tobacco. She reports that she drinks alcohol. She reports that she does not use drugs.  Allergies:  Allergies  Allergen Reactions  . Sulfa Antibiotics Hives    Medications Prior to Admission  Medication Sig Dispense Refill  . amLODipine (NORVASC) 5 MG tablet TAKE 1 TABLET BY MOUTH EVERY DAY 90 tablet 1  . fexofenadine (ALLEGRA) 180 MG tablet Take 180 mg by mouth daily.    Marland Kitchen GAVILYTE-N WITH FLAVOR PACK 420 g solution TAKE 4,000 MLS BY MOUTH ONCE FOR 1 DOSE.  0  . Krill Oil 500 MG CAPS Take 500 mg by mouth daily.     . Multiple Vitamins-Minerals (MULTIVITAMIN WITH MINERALS) tablet Take 1 tablet by mouth  daily.    . Probiotic Product (PROBIOTIC PO) Take 1 tablet by mouth daily.    . rosuvastatin (CRESTOR) 5 MG tablet TAKE 1 TABLET BY MOUTH EVERY DAY 90 tablet 1  . vitamin E 400 UNIT capsule Take 400 Units by mouth daily.    Marland Kitchen estradiol (VIVELLE-DOT) 0.1 MG/24HR patch PLACE 1 PATCH (0.1 MG TOTAL) ONTO THE SKIN 2 (TWO) TIMES A WEEK. (Patient not taking: Reported on 01/14/2018) 8 patch 8    No results found for this or any previous visit (from the past 48 hour(s)). No results found.  ROS  Blood pressure 114/74, pulse 67, temperature 98.6 F (37 C), temperature source Oral, resp. rate 13, height 5\' 4"  (1.626 m), weight 148 lb (67.1 kg), last menstrual period 10/11/2014, SpO2 100 %. Physical Exam  Constitutional: She appears well-developed and well-nourished.  HENT:  Mouth/Throat: Oropharynx is clear and moist.  Eyes: Conjunctivae are normal. No scleral icterus.  Neck: No thyromegaly present.  Cardiovascular: Normal rate, regular rhythm and normal heart sounds.  No murmur heard. Respiratory: Effort normal and breath sounds normal.  GI:  Abdomen is flat soft and nontender without organomegaly or masses.  Pfannenstiel scar noted.  Musculoskeletal: She exhibits no edema.  Lymphadenopathy:    She has no cervical adenopathy.  Neurological: She is alert.  Skin: Skin is warm and dry.     Assessment/Plan Average risk screening colonoscopy.  Hildred Laser, MD 04/13/2018, 7:29  AM

## 2018-04-13 NOTE — Op Note (Signed)
[facility] Patient Name: Cheyenne Harris Procedure Date: 04/13/2018 7:28 AM MRN: [id number] Date of Birth: 10-Jun-1966 Attending MD: Hildred Laser , MD CSN: [id number] Age: 52 Admit Type: Outpatient Procedure:                Colonoscopy Indications:              Screening for colorectal malignant neoplasm Providers:                Hildred Laser, MD, Janeece Riggers, RN, Nelma Rothman,                            Technician Referring MD:             Norwood Levo. Moshe Cipro, MD Medicines:                Meperidine 50 mg IV, Midazolam 7 mg IV Complications:            No immediate complications. Estimated Blood Loss:     Estimated blood loss: none. Procedure:                Pre-Anesthesia Assessment:                           - Prior to the procedure, a History and Physical                            was performed, and patient medications and                            allergies were reviewed. The patient's tolerance of                            previous anesthesia was also reviewed. The risks                            and benefits of the procedure and the sedation                            options and risks were discussed with the patient.                            All questions were answered, and informed consent                            was obtained. Prior Anticoagulants: The patient has                            taken no previous anticoagulant or antiplatelet                            agents. ASA Grade Assessment: I - A normal, healthy                            patient. After reviewing the risks and benefits,  the patient was deemed in satisfactory condition to                            undergo the procedure.                           After obtaining informed consent, the colonoscope                            was passed under direct vision. Throughout the                            procedure, the patient's blood pressure, pulse, and      oxygen saturations were monitored continuously. The                            [id number]-[id number] ([id number]) scope was introduced through                            the anus and advanced to the the cecum, identified                            by appendiceal orifice and ileocecal valve. The                            colonoscopy was technically difficult and complex                            due to significant looping. Successful completion                            of the procedure was aided by {skip}scope guide.                            The patient tolerated the procedure well. The                            quality of the bowel preparation was adequate. The                            ileocecal valve, appendiceal orifice, and rectum                            were photographed. Scope In: 7:38:57 AM Scope Out: 8:12:33 AM Total Procedure Duration: 0 hours 33 minutes 36 seconds  Findings:      The perianal and digital rectal examinations were normal.      A diffuse area of mild melanosis was found in the entire colon.      Internal hemorrhoids were found during retroflexion. The hemorrhoids       were small. Impression:               - Melanosis in the colon.                           -  Internal hemorrhoids.                           - No specimens collected. Moderate Sedation:      Moderate (conscious) sedation was administered by the endoscopy nurse       and supervised by the endoscopist. The following parameters were       monitored: oxygen saturation, heart rate, blood pressure, CO2       capnography and response to care. Total physician intraservice time was       39 minutes. Recommendation:           - Patient has a contact number available for                            emergencies. The signs and symptoms of potential                            delayed complications were discussed with the                            patient. Return to normal activities tomorrow.                             Written discharge instructions were provided to the                            patient.                           - High fiber diet today.                           - Continue present medications.                           - Repeat colonoscopy in 10 years for screening                            purposes. Procedure Code(s):        --- Professional ---                           [id number], Colonoscopy, flexible; diagnostic, including                            collection of specimen(s) by brushing or washing,                            when performed (separate procedure)                           G0500, Moderate sedation services provided by the                            same physician or other qualified health care  professional performing a gastrointestinal                            endoscopic service that sedation supports,                            requiring the presence of an independent trained                            observer to assist in the monitoring of the                            patient's level of consciousness and physiological                            status; initial 15 minutes of intra-service time;                            patient age 91 years or older (additional time may                            be reported with [phone], as appropriate)                           [phone], Moderate sedation services provided by the                            same physician or other qualified health care                            professional performing the diagnostic or                            therapeutic service that the sedation supports,                            requiring the presence of an independent trained                            observer to assist in the monitoring of the                            patient's level of consciousness and physiological                            status; each additional 15 minutes intraservice                             time (List separately in addition to code for                            primary service)                           [phone],  Moderate sedation services provided by the                            same physician or other qualified health care                            professional performing the diagnostic or                            therapeutic service that the sedation supports,                            requiring the presence of an independent trained                            observer to assist in the monitoring of the                            patient's level of consciousness and physiological                            status; each additional 15 minutes intraservice                            time (List separately in addition to code for                            primary service) Diagnosis Code(s):        --- Professional ---                           Z12.11, Encounter for screening for malignant                            neoplasm of colon                           K63.89, Other specified diseases of intestine                           K64.8, Other hemorrhoids CPT copyright 2017 American [employer]. All rights reserved. The codes documented in this report are preliminary and upon coder review may  be revised to meet current compliance requirements. Hildred Laser, MD Hildred Laser, MD 04/13/2018 8:19:23 AM This report has been signed electronically. Number of Addenda: 0

## 2018-04-13 NOTE — Discharge Instructions (Signed)
Resume usual medications as before. High-fiber diet. No driving for 24 hours. Next screening exam in 10 years.   Colonoscopy, Adult, Care After This sheet gives you information about how to care for yourself after your procedure. Your doctor may also give you more specific instructions. If you have problems or questions, call your doctor. Follow these instructions at home: General instructions   For the first 24 hours after the procedure: ? Do not drive or use machinery. ? Do not sign important documents. ? Do not drink alcohol. ? Do your daily activities more slowly than normal. ? Eat foods that are soft and easy to digest. ? Rest often.  Take over-the-counter or prescription medicines only as told by your doctor.  It is up to you to get the results of your procedure. Ask your doctor, or the department performing the procedure, when your results will be ready. To help cramping and bloating:  Try walking around.  Put heat on your belly (abdomen) as told by your doctor. Use a heat source that your doctor recommends, such as a moist heat pack or a heating pad. ? Put a towel between your skin and the heat source. ? Leave the heat on for 20-30 minutes. ? Remove the heat if your skin turns bright red. This is especially important if you cannot feel pain, heat, or cold. You can get burned. Eating and drinking  Drink enough fluid to keep your pee (urine) clear or pale yellow.  Return to your normal diet as told by your doctor. Avoid heavy or fried foods that are hard to digest.  Avoid drinking alcohol for as long as told by your doctor. Contact a doctor if:  You have blood in your poop (stool) 2-3 days after the procedure. Get help right away if:  You have more than a small amount of blood in your poop.  You see large clumps of tissue (blood clots) in your poop.  Your belly is swollen.  You feel sick to your stomach (nauseous).  You throw up (vomit).  You have a  fever.  You have belly pain that gets worse, and medicine does not help your pain. This information is not intended to replace advice given to you by your health care provider. Make sure you discuss any questions you have with your health care provider. Document Released: 10/24/2010 Document Revised: 06/15/2016 Document Reviewed: 06/15/2016 Elsevier Interactive Patient Education  2017 [employer]  Hemorrhoids Hemorrhoids are swollen veins in and around the rectum or anus. There are two types of hemorrhoids:  Internal hemorrhoids. These occur in the veins that are just inside the rectum. They may poke through to the outside and become irritated and painful.  External hemorrhoids. These occur in the veins that are outside of the anus and can be felt as a painful swelling or hard lump near the anus.  Most hemorrhoids do not cause serious problems, and they can be managed with home treatments such as diet and lifestyle changes. If home treatments do not help your symptoms, procedures can be done to shrink or remove the hemorrhoids. What are the causes? This condition is caused by increased pressure in the anal area. This pressure may result from various things, including:  Constipation.  Straining to have a bowel movement.  Diarrhea.  Pregnancy.  Obesity.  Sitting for long periods of time.  Heavy lifting or other activity that causes you to strain.  Anal sex.  What are the signs or symptoms? Symptoms of this  condition include:  Pain.  Anal itching or irritation.  Rectal bleeding.  Leakage of stool (feces).  Anal swelling.  One or more lumps around the anus.  How is this diagnosed? This condition can often be diagnosed through a visual exam. Other exams or tests may also be done, such as:  Examination of the rectal area with a gloved hand (digital rectal exam).  Examination of the anal canal using a small tube (anoscope).  A blood test, if you have lost a  significant amount of blood.  A test to look inside the colon (sigmoidoscopy or colonoscopy).  How is this treated? This condition can usually be treated at home. However, various procedures may be done if dietary changes, lifestyle changes, and other home treatments do not help your symptoms. These procedures can help make the hemorrhoids smaller or remove them completely. Some of these procedures involve surgery, and others do not. Common procedures include:  Rubber band ligation. Rubber bands are placed at the base of the hemorrhoids to cut off the blood supply to them.  Sclerotherapy. Medicine is injected into the hemorrhoids to shrink them.  Infrared coagulation. A type of light energy is used to get rid of the hemorrhoids.  Hemorrhoidectomy surgery. The hemorrhoids are surgically removed, and the veins that supply them are tied off.  Stapled hemorrhoidopexy surgery. A circular stapling device is used to remove the hemorrhoids and use staples to cut off the blood supply to them.  Follow these instructions at home: Eating and drinking  Eat foods that have a lot of fiber in them, such as whole grains, beans, nuts, fruits, and vegetables. Ask your health care provider about taking products that have added fiber (fiber supplements).  Drink enough fluid to keep your urine clear or pale yellow. Managing pain and swelling  Take warm sitz baths for 20 minutes, 3-4 times a day to ease pain and discomfort.  If directed, apply ice to the affected area. Using ice packs between sitz baths may be helpful. ? Put ice in a plastic bag. ? Place a towel between your skin and the bag. ? Leave the ice on for 20 minutes, 2-3 times a day. General instructions  Take over-the-counter and prescription medicines only as told by your health care provider.  Use medicated creams or suppositories as told.  Exercise regularly.  Go to the bathroom when you have the urge to have a bowel movement. Do not  wait.  Avoid straining to have bowel movements.  Keep the anal area dry and clean. Use wet toilet paper or moist towelettes after a bowel movement.  Do not sit on the toilet for long periods of time. This increases blood pooling and pain. Contact a health care provider if:  You have increasing pain and swelling that are not controlled by treatment or medicine.  You have uncontrolled bleeding.  You have difficulty having a bowel movement, or you are unable to have a bowel movement.  You have pain or inflammation outside the area of the hemorrhoids. This information is not intended to replace advice given to you by your health care provider. Make sure you discuss any questions you have with your health care provider. Document Released: 09/18/2000 Document Revised: 02/19/2016 Document Reviewed: 06/05/2015 Elsevier Interactive Patient Education  [employer].

## 2018-04-18 ENCOUNTER — Encounter (HOSPITAL_COMMUNITY): Payer: Self-pay | Admitting: Internal Medicine

## 2018-05-16 ENCOUNTER — Other Ambulatory Visit: Payer: Self-pay | Admitting: Family Medicine

## 2018-06-05 ENCOUNTER — Other Ambulatory Visit: Payer: Self-pay | Admitting: Family Medicine

## 2018-08-24 ENCOUNTER — Encounter: Payer: Self-pay | Admitting: Family Medicine

## 2018-08-24 ENCOUNTER — Ambulatory Visit (INDEPENDENT_AMBULATORY_CARE_PROVIDER_SITE_OTHER): Payer: 59 | Admitting: Family Medicine

## 2018-08-24 ENCOUNTER — Ambulatory Visit (HOSPITAL_COMMUNITY)
Admission: RE | Admit: 2018-08-24 | Discharge: 2018-08-24 | Disposition: A | Discharge status: Home / Self Care | Payer: 59 | Source: Ambulatory Visit | Attending: Family Medicine | Admitting: Family Medicine

## 2018-08-24 VITALS — BP 148/90 | HR 81 | Temp 99.0°F | Resp 12 | Ht 64.0 in | Wt 160.1 lb

## 2018-08-24 DIAGNOSIS — R5383 Other fatigue: Secondary | ICD-10-CM

## 2018-08-24 DIAGNOSIS — R05 Cough: Secondary | ICD-10-CM

## 2018-08-24 DIAGNOSIS — R059 Cough, unspecified: Secondary | ICD-10-CM

## 2018-08-24 DIAGNOSIS — I10 Essential (primary) hypertension: Secondary | ICD-10-CM | POA: Diagnosis not present

## 2018-08-24 DIAGNOSIS — G4452 New daily persistent headache (NDPH): Secondary | ICD-10-CM

## 2018-08-24 DIAGNOSIS — R509 Fever, unspecified: Secondary | ICD-10-CM

## 2018-08-24 DIAGNOSIS — J0100 Acute maxillary sinusitis, unspecified: Secondary | ICD-10-CM

## 2018-08-24 MED ORDER — AZITHROMYCIN 250 MG PO TABS: ORAL_TABLET | ORAL | 0 refills | Status: DC

## 2018-08-24 MED ORDER — AMLODIPINE BESYLATE 2.5 MG PO TABS: 2.5000 mg | ORAL_TABLET | Freq: Every day | ORAL | 3 refills | Status: DC

## 2018-08-24 NOTE — Progress Notes (Signed)
   Cheyenne Harris     MRN: [id number]      DOB: 1966-03-17   HPI Cheyenne Harris is  Wit  A 2 week h/o excessive fatigue, low grade fever, frontal pressure , and blood pressure running high. Clammy and sweaty at night. No sore throat , ear pain or cough Denies  Dysuria or frequency. Appetite normal , no nausea or change in BM 2 week of daily persistent headache which is new, concerned as not responding o medication which she normally uses, OTC analgesia, mother and sister both ha d arnold chiari malformation needing surgery Has been unable to work since 11/18 due to her  illness which is unusual for her  ROS C/o   fever , chills , fatigure x 2 weeks Denies sinus pressure, nasal congestion, ear pain or sore throat. Denies chest congestion, productive cough or wheezing. Denies chest pains, palpitations and leg swelling Denies abdominal pain, nausea, vomiting,diarrhea or constipation.   Denies dysuria, frequency, hesitancy or incontinence. Denies joint pain, swelling and limitation in mobility. Denies skin break down or rash.   PE  BP 128/86 (BP Location: Left Arm, Patient Position: Sitting, Cuff Size: Normal)   Pulse 81   Temp 99 F (37.2 C) (Oral)   Resp 12   Ht 5\' 4"  (1.626 m)   Wt 160 lb 1.9 oz (72.6 kg)   LMP 10/11/2014 Comment: hysterectomy  SpO2 98% Comment: room air  BMI 27.48 kg/m   Patient alert and oriented and in no cardiopulmonary distress.Ill appearing  HEENT: No facial asymmetry, EOMI,   oropharynx pink and moist.  Neck supple no JVD, no mass.Fundoscopy revaeals normal appearing blood vessels, no hemorrhage, mild frontal and maxillary tenderness  Chest: Clear to auscultation bilaterally.  CVS: S1, S2 no murmurs, no S3.Regular rate.  ABD: Soft non tender.   Ext: No edema  MS: Adequate ROM spine, shoulders, hips and knees.  Skin: Intact, no ulcerations or rash noted.  Psych: Good eye contact, normal affect. Memory intact  anxious not  depressed  appearing.  CNS: CN 2-12 intact, power,  normal throughout.no focal deficits noted.   Assessment & Plan  Fever 2 week h/o intermittent fever, chills and malaise, persistent headache with sinus pressure. treat for sinus infection , obtain CXR and obtain lab and radiologic data Work excuse from 11/18 to 08/30/2018  Essential hypertension Uncontrolled and may be contributing to new headacxhe . Incrase dose amlodipine to  7.5 mg daily F/u in 4 weeks DASH diet and commitment to daily physical activity for a minimum of 30 minutes discussed and encouraged, as a part of hypertension management. The importance of attaining a healthy weight is also discussed.  BP/Weight 08/30/2018 08/24/2018 04/13/2018 01/14/2018 12/13/2017 07/05/2017 7/35/3299  Systolic BP 242 683 419 622 297 989 211  Diastolic BP 92 90 67 76 82 62 74  Wt. (Lbs) 156.5 160.12 148 152 150.4 158.25 151  BMI 26.86 27.48 25.4 26.09 25.82 27.16 25.92       Acute maxillary sinusitis z pack prescribed  New persistent daily headache 2 week h/o daily persistent headache no response to anti inflammatory, associated with malaise , chills, fever, also has uncontrolled blood pressure and f/h of Arnold chiari malformation in 2 family members requiring surgery. Neurology referral also if  pt reports ongoing symptoms

## 2018-08-24 NOTE — Patient Instructions (Addendum)
F/U 2nd week in December , call if you need me sooner   Labs today at hospital( if able)  CXR today  Work excuse from 11/18 to return 08/30/2018  Your blood pressure is too high, take additionally amlodipine 2.5 mg once daily, continue the 5 mg tablet every day, you may take them together ( ototal is 7.5 mg daily of amlodipine)  Azithromycin is sent in for infection

## 2018-08-25 ENCOUNTER — Encounter: Payer: Self-pay | Admitting: Family Medicine

## 2018-08-25 DIAGNOSIS — R509 Fever, unspecified: Secondary | ICD-10-CM | POA: Diagnosis not present

## 2018-08-25 DIAGNOSIS — R5383 Other fatigue: Secondary | ICD-10-CM | POA: Diagnosis not present

## 2018-08-25 LAB — CBC WITH DIFFERENTIAL/PLATELET
Basophils Absolute: 31 cells/uL (ref 0–200)
Basophils Relative: 0.6 %
EOS ABS: 41 {cells}/uL (ref 15–500)
Eosinophils Relative: 0.8 %
HCT: 41.4 % (ref 35.0–45.0)
Hemoglobin: 13.7 g/dL (ref 11.7–15.5)
Lymphs Abs: 1851 cells/uL (ref 850–3900)
MCH: 28.6 pg (ref 27.0–33.0)
MCHC: 33.1 g/dL (ref 32.0–36.0)
MCV: 86.4 fL (ref 80.0–100.0)
MPV: 12.7 fL — ABNORMAL HIGH (ref 7.5–12.5)
Monocytes Relative: 6.5 %
NEUTROS PCT: 55.8 %
Neutro Abs: 2846 cells/uL (ref 1500–7800)
PLATELETS: 197 10*3/uL (ref 140–400)
RBC: 4.79 10*6/uL (ref 3.80–5.10)
RDW: 12.1 % (ref 11.0–15.0)
TOTAL LYMPHOCYTE: 36.3 %
WBC mixed population: 332 cells/uL (ref 200–950)
WBC: 5.1 10*3/uL (ref 3.8–10.8)

## 2018-08-25 LAB — COMPLETE METABOLIC PANEL WITH GFR
AG Ratio: 1.3 (calc) (ref 1.0–2.5)
ALBUMIN MSPROF: 4 g/dL (ref 3.6–5.1)
ALKALINE PHOSPHATASE (APISO): 91 U/L (ref 33–130)
ALT: 14 U/L (ref 6–29)
AST: 19 U/L (ref 10–35)
BUN: 16 mg/dL (ref 7–25)
CALCIUM: 9.9 mg/dL (ref 8.6–10.4)
CO2: 31 mmol/L (ref 20–32)
Chloride: 106 mmol/L (ref 98–110)
Creat: 0.98 mg/dL (ref 0.50–1.05)
GFR, EST AFRICAN AMERICAN: 77 mL/min/{1.73_m2} (ref >=60)
GFR, EST NON AFRICAN AMERICAN: 67 mL/min/{1.73_m2} (ref >=60)
GLOBULIN: 3 g/dL (ref 1.9–3.7)
Glucose, Bld: 87 mg/dL (ref 65–139)
Potassium: 4.5 mmol/L (ref 3.5–5.3)
SODIUM: 146 mmol/L (ref 135–146)
Total Bilirubin: 0.4 mg/dL (ref 0.2–1.2)
Total Protein: 7 g/dL (ref 6.1–8.1)

## 2018-08-26 ENCOUNTER — Encounter: Payer: Self-pay | Admitting: Family Medicine

## 2018-08-29 ENCOUNTER — Telehealth: Payer: Self-pay | Admitting: Family Medicine

## 2018-08-29 ENCOUNTER — Other Ambulatory Visit: Payer: Self-pay | Admitting: Family Medicine

## 2018-08-29 ENCOUNTER — Telehealth: Payer: Self-pay | Admitting: *Deleted

## 2018-08-29 DIAGNOSIS — G4452 New daily persistent headache (NDPH): Secondary | ICD-10-CM

## 2018-08-29 DIAGNOSIS — B349 Viral infection, unspecified: Secondary | ICD-10-CM

## 2018-08-29 DIAGNOSIS — R5383 Other fatigue: Secondary | ICD-10-CM

## 2018-08-29 NOTE — Progress Notes (Signed)
[facility] 

## 2018-08-29 NOTE — Telephone Encounter (Signed)
adressed

## 2018-08-29 NOTE — Telephone Encounter (Signed)
I initially called pt re her message of ongoing new daily headache in context of possible viral illness. I advised ED evaluation today and expressed concern re possibility of viral infection affecting the brain causing the headache. I also advised that we will try t get a neurology appt for her today if none available , she needs to go to the ED  Referral is entered pls call [facility] and dr Merlene Laughter if able see if either can see her today let her know if not thanks

## 2018-08-29 NOTE — Telephone Encounter (Signed)
Thank you very much 

## 2018-08-29 NOTE — Telephone Encounter (Signed)
Called [facility] and they will see her tomorrow at 1pm.  I called patient and gave her appt time and address and phone of office.  She will go to the appt tomorrow and if any changes today or tonight go to the ER. She is seeing Dr Krista Blue at [facility]

## 2018-08-29 NOTE — Telephone Encounter (Signed)
Pt was saw last Wednesday received antibiotic (zithromaycin) husband call said she is feeling a little better but is still tired and weak, no energy. Wanted to know if she needed to come back in or if something could be called in for this.

## 2018-08-30 ENCOUNTER — Ambulatory Visit: Payer: 59 | Admitting: Neurology

## 2018-08-30 ENCOUNTER — Encounter: Payer: Self-pay | Admitting: Neurology

## 2018-08-30 VITALS — BP 138/92 | HR 82 | Ht 64.0 in | Wt 156.5 lb

## 2018-08-30 DIAGNOSIS — G43709 Chronic migraine without aura, not intractable, without status migrainosus: Secondary | ICD-10-CM | POA: Diagnosis not present

## 2018-08-30 DIAGNOSIS — IMO0002 Reserved for concepts with insufficient information to code with codable children: Secondary | ICD-10-CM | POA: Insufficient documentation

## 2018-08-30 MED ORDER — SUMATRIPTAN SUCCINATE 50 MG PO TABS: 50.0000 mg | ORAL_TABLET | ORAL | 6 refills | Status: DC | PRN

## 2018-08-30 NOTE — Progress Notes (Signed)
PATIENT: Cheyenne Harris DOB: 05-23-1966  Chief Complaint  Patient presents with  . New Patient (Initial Visit)    Np for headaches. Internal referral. Mother present. Rm 4. Patient mentioned that she has been having disabling headache for 3 weeks now. Patient stated that she has a headache right now. She has taken some motrin.     HISTORICAL  Cheyenne Harris is a 52 year old female, seen in request by her primary care physician Dr. Tula Nakayama for evaluation of headache, initial evaluation was on August 30, 2018.  I have reviewed and summarized the referring note from the referring physician.  She had a past medical history of hypertension, hyperlipidemia, reported a history of migraine headaches, also personally reviewed MRI of the brain 2007, MRI was taken for passing out spells, there was no acute abnormality, evidence of Arnold-Chiari 1 malformation, no evidence of obstruction  Over the years, she has intermittent headache, her typical migraine are lateralized severe pounding headache with associated light noise sensitivity, nauseous, lying down in dark quiet room, ibuprofen as needed would help.  She only get occasional headache in the past, this headache started in October 2019, her typical migraine headache involving left retro-orbital area, also with left eye tearing,, but headache did not went away with ibuprofen treatment, she has daily mild to moderate left retro-orbital area pressure headaches, has been taking ibuprofen couple times each day with limited help,   Denies dysarthria, lateralized motor or sensory deficit.   REVIEW OF SYSTEMS: Full 14 system review of systems performed and notable only for fever, fatigue, palpitation, eye pain, feeling hot, feeling cold, headache, dizziness, tremor, insomnia, not enough, decreased energy, not enough sleep, decreases energy.  All other review of systems were negative.  ALLERGIES: Allergies  Allergen Reactions  . Sulfa  Antibiotics Hives    HOME MEDICATIONS: Current Outpatient Medications  Medication Sig Dispense Refill  . amLODipine (NORVASC) 2.5 MG tablet Take 1 tablet (2.5 mg total) by mouth daily. 30 tablet 3  . amLODipine (NORVASC) 5 MG tablet TAKE 1 TABLET BY MOUTH EVERY DAY 90 tablet 1  . azithromycin (ZITHROMAX) 250 MG tablet Two tablets on day one , then one tablet once daily for an additional four days 6 tablet 0  . fexofenadine (ALLEGRA) 180 MG tablet Take 180 mg by mouth daily.    [name] 500 MG CAPS Take 500 mg by mouth daily.     . Multiple Vitamins-Minerals (MULTIVITAMIN WITH MINERALS) tablet Take 1 tablet by mouth daily.    . Probiotic Product (PROBIOTIC PO) Take 1 tablet by mouth daily.    . rosuvastatin (CRESTOR) 5 MG tablet TAKE 1 TABLET BY MOUTH EVERY DAY 90 tablet 1  . vitamin E 400 UNIT capsule Take 400 Units by mouth daily.     No current facility-administered medications for this visit.     PAST MEDICAL HISTORY: Past Medical History:  Diagnosis Date  . Complication of anesthesia   . Hyperlipidemia   . Hypertension   . PONV (postoperative nausea and vomiting)     PAST SURGICAL HISTORY: Past Surgical History:  Procedure Laterality Date  . ABDOMINAL HYSTERECTOMY    . BILATERAL SALPINGECTOMY Bilateral 03/12/2015   Procedure: BILATERAL SALPINGECTOMY;  Surgeon: Jonnie Kind, MD;  Location: [facility];  Service: Gynecology;  Laterality: Bilateral;  . COLONOSCOPY N/A 04/13/2018   Procedure: COLONOSCOPY;  Surgeon: Rogene Houston, MD;  Location: [facility];  Service: Endoscopy;  Laterality: N/A;  730  .  SUPRACERVICAL ABDOMINAL HYSTERECTOMY N/A 03/12/2015   Procedure: HYSTERECTOMY SUPRACERVICAL ABDOMINAL;  Surgeon: Jonnie Kind, MD;  Location: [facility];  Service: Gynecology;  Laterality: N/A;  . WISDOM TOOTH EXTRACTION      FAMILY HISTORY: Family History  Problem Relation Age of Onset  . Hypertension Mother   . Hypertension Sister   . Hypertension Brother   . Diabetes  Maternal Grandmother   . Heart disease Maternal Grandmother   . Hypertension Sister     SOCIAL HISTORY: Social History   Socioeconomic History  . Marital status: Married    Spouse name: Not on file  . Number of children: Not on file  . Years of education: Not on file  . Highest education level: Not on file  Occupational History  . Not on file  Social Needs  . Financial resource strain: Not on file  . Food insecurity:    Worry: Not on file    Inability: Not on file  . Transportation needs:    Medical: Not on file    Non-medical: Not on file  Tobacco Use  . Smoking status: Never Smoker  . Smokeless tobacco: Never Used  Substance and Sexual Activity  . Alcohol use: Yes    Alcohol/week: 0.0 standard drinks    Comment: occasionally  . Drug use: No  . Sexual activity: Yes    Birth control/protection: Surgical  Lifestyle  . Physical activity:    Days per week: Not on file    Minutes per session: Not on file  . Stress: Not on file  Relationships  . Social connections:    Talks on phone: Not on file    Gets together: Not on file    Attends religious service: Not on file    Active member of club or organization: Not on file    Attends meetings of clubs or organizations: Not on file    Relationship status: Not on file  . Intimate partner violence:    Fear of current or ex partner: Not on file    Emotionally abused: Not on file    Physically abused: Not on file    Forced sexual activity: Not on file  Other Topics Concern  . Not on file  Social History Narrative  . Not on file     PHYSICAL EXAM   Vitals:   08/30/18 1302  BP: (!) 138/92  Pulse: 82  Weight: 156 lb 8 oz (71 kg)  Height: 5\' 4"  (1.626 m)    Not recorded      Body mass index is 26.86 kg/m.  PHYSICAL EXAMNIATION:  Gen: NAD, conversant, well nourised, obese, well groomed                     Cardiovascular: Regular rate rhythm, no peripheral edema, warm, nontender. Eyes: Conjunctivae clear  without exudates or hemorrhage Neck: Supple, no carotid bruits. Pulmonary: Clear to auscultation bilaterally   NEUROLOGICAL EXAM:  MENTAL STATUS: Speech:    Speech is normal; fluent and spontaneous with normal comprehension.  Cognition:     Orientation to time, place and person     Normal recent and remote memory     Normal Attention span and concentration     Normal Language, naming, repeating,spontaneous speech     Fund of knowledge   CRANIAL NERVES: CN II: Visual fields are full to confrontation. Fundoscopic exam is normal with sharp discs and no vascular changes. Pupils are round equal and briskly reactive to light. CN III,  IV, VI: extraocular movement are normal. No ptosis. CN V: Facial sensation is intact to pinprick in all 3 divisions bilaterally. Corneal responses are intact.  CN VII: Face is symmetric with normal eye closure and smile. CN VIII: Hearing is normal to rubbing fingers CN IX, X: Palate elevates symmetrically. Phonation is normal. CN XI: Head turning and shoulder shrug are intact CN XII: Tongue is midline with normal movements and no atrophy.  MOTOR: There is no pronator drift of out-stretched arms. Muscle bulk and tone are normal. Muscle strength is normal.  REFLEXES: Reflexes are 2+ and symmetric at the biceps, triceps, knees, and ankles. Plantar responses are flexor.  SENSORY: Intact to light touch, pinprick, positional sensation and vibratory sensation are intact in fingers and toes.  COORDINATION: Rapid alternating movements and fine finger movements are intact. There is no dysmetria on finger-to-nose and heel-knee-shin.    GAIT/STANCE: Posture is normal. Gait is steady with normal steps, base, arm swing, and turning. Heel and toe walking are normal. Tandem gait is normal.  Romberg is absent.   DIAGNOSTIC DATA (LABS, IMAGING, TESTING) - I reviewed patient records, labs, notes, testing and imaging myself where available.   ASSESSMENT AND  PLAN  ISHI DANSER is a 52 y.o. female   Chronic migraine headaches  Imitrex 50 mg as needed  Also suggested magnesium oxide riboflavin as preventive medications,  If she continues to have recurrent headaches with treatment, she will contact our office for potential MRI of brain  Marcial Pacas, M.D. Ph.D.  [facility] [address], [place], [place] [address] Ph: [phone] Fax: [phone]  CC: Fayrene Helper, MD

## 2018-09-05 ENCOUNTER — Encounter: Payer: Self-pay | Admitting: Family Medicine

## 2018-09-05 ENCOUNTER — Telehealth: Payer: Self-pay | Admitting: Neurology

## 2018-09-05 DIAGNOSIS — R509 Fever, unspecified: Secondary | ICD-10-CM | POA: Insufficient documentation

## 2018-09-05 DIAGNOSIS — Q0700 Arnold-Chiari syndrome without spina bifida or hydrocephalus: Secondary | ICD-10-CM

## 2018-09-05 NOTE — Assessment & Plan Note (Signed)
z pack prescribed 

## 2018-09-05 NOTE — Assessment & Plan Note (Signed)
2 week h/o daily persistent headache no response to anti inflammatory, associated with malaise , chills, fever, also has uncontrolled blood pressure and f/h of Arnold chiari malformation in 2 family members requiring surgery. Neurology referral also if  pt reports ongoing symptoms

## 2018-09-05 NOTE — Assessment & Plan Note (Addendum)
2 week h/o intermittent fever, chills and malaise, persistent headache with sinus pressure. treat for sinus infection , obtain CXR and obtain lab and radiologic data Work excuse from 11/18 to 08/30/2018

## 2018-09-05 NOTE — Assessment & Plan Note (Signed)
Uncontrolled and may be contributing to new headacxhe . Incrase dose amlodipine to  7.5 mg daily F/u in 4 weeks DASH diet and commitment to daily physical activity for a minimum of 30 minutes discussed and encouraged, as a part of hypertension management. The importance of attaining a healthy weight is also discussed.  BP/Weight 08/30/2018 08/24/2018 04/13/2018 01/14/2018 12/13/2017 07/05/2017 8/65/7846  Systolic BP 962 952 841 324 401 027 253  Diastolic BP 92 90 67 76 82 62 74  Wt. (Lbs) 156.5 160.12 148 152 150.4 158.25 151  BMI 26.86 27.48 25.4 26.09 25.82 27.16 25.92

## 2018-09-05 NOTE — Telephone Encounter (Signed)
Cheyenne Helper, MD  Marcial Pacas, MD  Cc: Cheyenne Helper, MD        Good Morning,  Thank you for som promptly seeing my patient re daily persisitent headache last week.  She has reached out asking for brain scan, as headache remains at a 5 , not going away, her mother and sister both had [name] Chiari malformations .  Would you Be open to ordering and following up the scan or if would you rather for me to order please let me know exactly what is the best study to order.  I have sent her a message letting her know that I am in touch With you  Thanks, Joycelyn Schmid    I ordered MRI brain wo

## 2018-09-06 ENCOUNTER — Telehealth: Payer: Self-pay | Admitting: Family Medicine

## 2018-09-06 ENCOUNTER — Telehealth: Payer: Self-pay | Admitting: Neurology

## 2018-09-06 NOTE — Telephone Encounter (Signed)
Patient returned my call. She is scheduled for 09/07/18 at [facility].

## 2018-09-06 NOTE — Telephone Encounter (Signed)
lvm for pt to call back about scheduling [facility] Auth: [id number] (exp. 09/06/18 to 10/21/18)

## 2018-09-06 NOTE — Telephone Encounter (Signed)
FMLA received Copied Sleeved  Noted

## 2018-09-07 ENCOUNTER — Ambulatory Visit: Payer: 59

## 2018-09-07 DIAGNOSIS — Q0700 Arnold-Chiari syndrome without spina bifida or hydrocephalus: Secondary | ICD-10-CM | POA: Diagnosis not present

## 2018-09-13 ENCOUNTER — Ambulatory Visit (HOSPITAL_COMMUNITY)
Admission: RE | Admit: 2018-09-13 | Discharge: 2018-09-13 | Disposition: A | Discharge status: Home / Self Care | Payer: 59 | Source: Ambulatory Visit | Attending: Family Medicine | Admitting: Family Medicine

## 2018-09-13 ENCOUNTER — Encounter: Payer: Self-pay | Admitting: Family Medicine

## 2018-09-13 ENCOUNTER — Ambulatory Visit: Payer: 59 | Admitting: Family Medicine

## 2018-09-13 VITALS — BP 109/76 | HR 92 | Resp 14 | Ht 64.0 in | Wt 158.0 lb

## 2018-09-13 DIAGNOSIS — G43709 Chronic migraine without aura, not intractable, without status migrainosus: Secondary | ICD-10-CM

## 2018-09-13 DIAGNOSIS — IMO0002 Reserved for concepts with insufficient information to code with codable children: Secondary | ICD-10-CM

## 2018-09-13 DIAGNOSIS — I10 Essential (primary) hypertension: Secondary | ICD-10-CM

## 2018-09-13 DIAGNOSIS — M542 Cervicalgia: Secondary | ICD-10-CM

## 2018-09-13 MED ORDER — ONDANSETRON HCL 4 MG PO TABS: ORAL_TABLET | ORAL | 1 refills | Status: DC

## 2018-09-13 MED ORDER — PREDNISONE 10 MG PO TABS: 10.0000 mg | ORAL_TABLET | Freq: Two times a day (BID) | ORAL | 0 refills | Status: AC

## 2018-09-13 NOTE — Patient Instructions (Addendum)
F/U in 4.5 , call if you need me before  Every day take topamax to reduce frequency of migraines  Once a migraine starts take immiterex and zofran, max of 4 tabs per week  X ray of neck today please and if abnormal will try to move forward with MRI due to symptoms, of neck pain and tingling in upper extremities

## 2018-09-13 NOTE — Progress Notes (Signed)
   Cheyenne Harris     MRN: [id number]      DOB: 1966-05-19   HPI Cheyenne Harris is here for follow up and re-evaluation of chronic medical conditions, medication management and review of any available recent lab and radiology data.  Preventive health is updated, specifically  Cancer screening and Immunization.   Bi;ateral upper ext numbness and tingling to elbow x 9 months in right, left x 1 week, mild weakness Still c/o headache but has not been taking prescribed medications per Neurology  ROS Denies recent fever or chills. Denies sinus pressure, nasal congestion, ear pain or sore throat. Denies chest congestion, productive cough or wheezing. Denies chest pains, palpitations and leg swelling Denies abdominal pain, nausea, vomiting,diarrhea or constipation.   Denies dysuria, frequency, hesitancy or incontinence.Denies depression, or insomnia. Denies skin break down or rash.   PE  BP 109/76 (BP Location: Left Arm, Patient Position: Sitting, Cuff Size: Normal)   Pulse 92   Resp 14   Ht 5\' 4"  (1.626 m)   Wt 158 lb 0.6 oz (71.7 kg)   LMP 10/11/2014 Comment: hysterectomy  SpO2 99% Comment: room air  BMI 27.13 kg/m   Patient alert and oriented and in no cardiopulmonary distress.  HEENT: No facial asymmetry, EOMI,   oropharynx pink and moist.  Neck adequate / normal ROM no JVD, no mass.  Chest: Clear to auscultation bilaterally.  CVS: S1, S2 no murmurs, no S3.Regular rate.  ABD: Soft non tender.   Ext: No edema  MS: Adequate ROM spine, shoulders, hips and knees.  Skin: Intact, no ulcerations or rash noted.  Psych: Good eye contact, normal affect. Memory intact not anxious or depressed appearing.  CNS: CN 2-12 intact, power,  normal throughout.no focal deficits noted.   Assessment & Plan  Chronic migraine Uncontrolled and persisting due to medication non compliance. Pt again re educated regarding the importance of prophylactic medication to improve symptoms and treat her  headaches, she now agrees to follow through. Reassured having had the brain scan  Bilateral posterior neck pain Neck pain rated at a 7 with [pper extremity tingling and reported mild weakness exam in office demonstrates grade 5 power in both upper extremities and no loss of sensation. X ray of C spine ordered, if very abnormal will proceed with further imaging  Essential hypertension Controlled, no change in medication DASH diet and commitment to daily physical activity for a minimum of 30 minutes discussed and encouraged, as a part of hypertension management. The importance of attaining a healthy weight is also discussed.  BP/Weight 09/13/2018 08/30/2018 08/24/2018 04/13/2018 01/14/2018 12/13/2017 83/0/9407  Systolic BP 680 881 103 159 458 592 924  Diastolic BP 76 92 90 67 76 82 62  Wt. (Lbs) 158.04 156.5 160.12 148 152 150.4 158.25  BMI 27.13 26.86 27.48 25.4 26.09 25.82 27.16

## 2018-09-15 DIAGNOSIS — M542 Cervicalgia: Secondary | ICD-10-CM

## 2018-09-16 ENCOUNTER — Encounter: Payer: Self-pay | Admitting: Family Medicine

## 2018-09-23 ENCOUNTER — Encounter: Payer: Self-pay | Admitting: Family Medicine

## 2018-09-23 NOTE — Assessment & Plan Note (Signed)
Neck pain rated at a 7 with [pper extremity tingling and reported mild weakness exam in office demonstrates grade 5 power in both upper extremities and no loss of sensation. X ray of C spine ordered, if very abnormal will proceed with further imaging

## 2018-09-23 NOTE — Assessment & Plan Note (Signed)
Controlled, no change in medication DASH diet and commitment to daily physical activity for a minimum of 30 minutes discussed and encouraged, as a part of hypertension management. The importance of attaining a healthy weight is also discussed.  BP/Weight 09/13/2018 08/30/2018 08/24/2018 04/13/2018 01/14/2018 12/13/2017 20/11/5425  Systolic BP 062 376 283 151 761 607 371  Diastolic BP 76 92 90 67 76 82 62  Wt. (Lbs) 158.04 156.5 160.12 148 152 150.4 158.25  BMI 27.13 26.86 27.48 25.4 26.09 25.82 27.16

## 2018-09-23 NOTE — Assessment & Plan Note (Signed)
Uncontrolled and persisting due to medication non compliance. Pt again re educated regarding the importance of prophylactic medication to improve symptoms and treat her headaches, she now agrees to follow through. Reassured having had the brain scan

## 2018-11-08 ENCOUNTER — Other Ambulatory Visit: Payer: Self-pay | Admitting: Family Medicine

## 2018-11-16 ENCOUNTER — Other Ambulatory Visit: Payer: Self-pay | Admitting: Family Medicine

## 2018-11-16 DIAGNOSIS — I10 Essential (primary) hypertension: Secondary | ICD-10-CM

## 2018-12-04 ENCOUNTER — Other Ambulatory Visit: Payer: Self-pay | Admitting: Family Medicine

## 2019-01-18 ENCOUNTER — Telehealth: Payer: Self-pay | Admitting: *Deleted

## 2019-01-18 ENCOUNTER — Emergency Department (HOSPITAL_COMMUNITY)
Admission: EM | Admit: 2019-01-18 | Discharge: 2019-01-18 | Disposition: A | Discharge status: Home / Self Care | Payer: 59 | Attending: Emergency Medicine | Admitting: Emergency Medicine

## 2019-01-18 ENCOUNTER — Encounter (HOSPITAL_COMMUNITY): Payer: Self-pay | Admitting: *Deleted

## 2019-01-18 ENCOUNTER — Emergency Department (HOSPITAL_COMMUNITY): Payer: 59

## 2019-01-18 ENCOUNTER — Other Ambulatory Visit: Payer: Self-pay

## 2019-01-18 DIAGNOSIS — R0602 Shortness of breath: Secondary | ICD-10-CM

## 2019-01-18 DIAGNOSIS — I10 Essential (primary) hypertension: Secondary | ICD-10-CM | POA: Diagnosis not present

## 2019-01-18 NOTE — ED Triage Notes (Addendum)
Pt c/o SOB that worsened this morning after walking a quarter mile going into work. Pt reports the SOB all started 4 days ago when she had to start wearing a mask at work and she thought that was why. 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 Pt reports she had been in close proximity with this patient recently at work when they sit together to talk. Pt denies fever, cough. Pt reports the SOB is worse when she wears the face mask, but she does still have it even when it's off.

## 2019-01-18 NOTE — Discharge Instructions (Signed)
You were seen in the ED today with shortness of breath. Your x-ray was normal. We do not see any sign of pneumonia. Your oxygen levels are normal. We are asking that anything with respiratory symptoms like your stay home and away from others for at least a week. I am providing you a work note. Return to the ED with any worsening shortness of breath, chest pain, lightheadedness, or other concerning symptoms.

## 2019-01-18 NOTE — ED Provider Notes (Signed)
Emergency Department Provider Note   I have reviewed the triage vital signs and the nursing notes.   HISTORY  Chief Complaint Shortness of Breath   HPI Cheyenne Harris is a 53 y.o. female with PMH of HLD and HTN presents to the emergency department for evaluation of shortness of breath symptoms.  Patient has had several days of symptoms.  She had a coworker tested positive for COVID-19 who is currently in the hospital and on a ventilator.  She ports growing increasingly concerned about her symptoms in the setting.  She sits approximately 15 feet from this person and had regular contact with them while they were at work.  Last contact was 10 days ago.  She reports some runny nose, shortness of breath, mild sore throat.  She denies any fevers, chills, body aches.  No abdominal symptoms.  Denies chest pain or heart palpitations.  She has not taken any new medications.  She states that her symptoms feel similar to seasonal allergies which she has had in the past.  Shortness of breath is worse with wearing her mask at work but also feels shortness of breath without the mask.  Past Medical History:  Diagnosis Date  . Complication of anesthesia   . Hyperlipidemia   . Hypertension   . PONV (postoperative nausea and vomiting)     Patient Active Problem List   Diagnosis Date Noted  . Bilateral posterior neck pain 09/13/2018  . Chronic migraine 08/30/2018  . Special screening for malignant neoplasms, colon 12/30/2017  . Refused H1N1 influenza virus immunization 07/06/2017  . Dyspepsia 07/05/2017  . New persistent daily headache 02/16/2017  . Motion sickness 02/16/2017  . Allergic rhinitis 12/13/2015  . Overweight (BMI 25.0-29.9) 05/26/2015  . Essential hypertension 05/23/2015  . Hyperlipidemia LDL goal <100 01/11/2008    Past Surgical History:  Procedure Laterality Date  . ABDOMINAL HYSTERECTOMY    . BILATERAL SALPINGECTOMY Bilateral 03/12/2015   Procedure: BILATERAL SALPINGECTOMY;   Surgeon: Jonnie Kind, MD;  Location: [facility];  Service: Gynecology;  Laterality: Bilateral;  . COLONOSCOPY N/A 04/13/2018   Procedure: COLONOSCOPY;  Surgeon: Rogene Houston, MD;  Location: [facility];  Service: Endoscopy;  Laterality: N/A;  730  . SUPRACERVICAL ABDOMINAL HYSTERECTOMY N/A 03/12/2015   Procedure: HYSTERECTOMY SUPRACERVICAL ABDOMINAL;  Surgeon: Jonnie Kind, MD;  Location: [facility];  Service: Gynecology;  Laterality: N/A;  . WISDOM TOOTH EXTRACTION      Allergies Sulfa antibiotics  Family History  Problem Relation Age of Onset  . Hypertension Mother   . Hypertension Sister   . Hypertension Brother   . Diabetes Maternal Grandmother   . Heart disease Maternal Grandmother   . Hypertension Sister     Social History Social History   Tobacco Use  . Smoking status: Never Smoker  . Smokeless tobacco: Never Used  Substance Use Topics  . Alcohol use: Yes    Alcohol/week: 0.0 standard drinks    Comment: occasionally  . Drug use: No    Review of Systems  Constitutional: No fever/chills Eyes: No visual changes. ENT: Positive sore throat. Cardiovascular: Denies chest pain. Respiratory: Positive shortness of breath. Gastrointestinal: No abdominal pain.  No nausea, no vomiting.  No diarrhea.  No constipation. Genitourinary: Negative for dysuria. Musculoskeletal: Negative for back pain. Skin: Negative for rash. Neurological: Negative for headaches, focal weakness or numbness.  10-point ROS otherwise negative.  ____________________________________________   PHYSICAL EXAM:  VITAL SIGNS: Vitals:   01/18/19 0958  SpO2: 100%  BP: 134/56 Pulse: 74 RR: 18   Constitutional: Alert and oriented. Well appearing and in no acute distress. Eyes: Conjunctivae are normal.  Head: Atraumatic. Nose: No congestion/rhinnorhea. Mouth/Throat: Mucous membranes are moist.  Neck: No stridor.   Cardiovascular: Normal rate, regular rhythm. Good peripheral circulation.  Grossly normal heart sounds.   Respiratory: Normal respiratory effort.  No retractions. Lungs CTAB. Gastrointestinal: Soft and nontender. No distention.  Musculoskeletal: No lower extremity tenderness nor edema. No gross deformities of extremities. Neurologic:  Normal speech and language. No gross focal neurologic deficits are appreciated.  Skin:  Skin is warm, dry and intact. No rash noted. ____________________________________________  EKG   EKG Interpretation  Date/Time:  Wednesday January 18 2019 10:02:02 EDT Ventricular Rate:  69 PR Interval:    QRS Duration: 79 QT Interval:  409 QTC Calculation: 439 R Axis:   13 Text Interpretation:  Sinus rhythm No STEMI.  Confirmed by Nanda Quinton [phone]) on 01/18/2019 10:16:00 AM       ____________________________________________  RADIOLOGY  Dg Chest Portable 1 View  Result Date: 01/18/2019 CLINICAL DATA:  Worsening shortness of breath. Recent contact with COVID-19 positive co-worker. EXAM: PORTABLE CHEST 1 VIEW COMPARISON:  Chest x-ray dated August 24, 2018. FINDINGS: The heart size and mediastinal contours are within normal limits. Both lungs are clear. The visualized skeletal structures are unremarkable. IMPRESSION: No active disease. Electronically Signed   By: Titus Dubin M.D.   On: 01/18/2019 10:46    ____________________________________________   PROCEDURES  Procedure(s) performed:   Procedures  None  ____________________________________________   INITIAL IMPRESSION / ASSESSMENT AND PLAN / ED COURSE  Pertinent labs & imaging results that were available during my care of the patient were reviewed by me and considered in my medical decision making (see chart for details).   Patient presents to the emergency department for evaluation of shortness of breath symptoms.  She has a known COVID-19 exposure at work.  Her symptoms are mild.  She is afebrile.  No hypoxemia.  She appears very comfortable and is in no acute  respiratory distress.  He was referred to the emergency department by her PCP after phone discussion. Plan for portable chest x-ray and ambulatory pulse ox.   11:00 AM  Patient ambulatory in the room on pulse ox without desaturation or increased shortness of breath.  Chest x-ray negative for infiltrate or atypical pneumonia.  Plan for isolation at home for the next week.  I have written a work note to this effect.  Patient understands that if symptoms worsen or she develops fever she will need to reach out to her PCP and/or telehealth for evaluation.  Discussed ED return precautions.  Discussed if her symptoms worsen in the coming week her time off of work may need to be extended.  ____________________________________________  FINAL CLINICAL IMPRESSION(S) / ED DIAGNOSES  Final diagnoses:  SOB (shortness of breath)    Note:  This document was prepared using Dragon voice recognition software and may include unintentional dictation errors.  Nanda Quinton, MD Emergency Medicine    Murl Golladay, [facility], MD 01/18/19 [phone]

## 2019-01-18 NOTE — Telephone Encounter (Signed)
Pts husband called Wilber Oliphant) Pt works at [employer] and her coworker is in the hospital on the ventilator in a hospital for Covid 19. They work in an office and they have been around each other. He stated she left for work this morning around 5 and was back home at 615 as she could not breathe. I recommended him to the ER if she was having trouble breathing but husband said she was insisting to wait and see what the nurse would recommend. Husband said pt was blaming the shortness of breath on the mask they are wearing but she has been wearing them and not short of breath. She does not have a fever at this time.

## 2019-01-18 NOTE — Telephone Encounter (Signed)
Please advise recommendation and I will let her know

## 2019-01-18 NOTE — ED Notes (Signed)
Pt's O2 sats stayed at 100% while ambulating. No SOB.

## 2019-01-18 NOTE — ED Notes (Signed)
Cheyenne Harris, NT walked pt around her room and pt's O2 sat stayed at 100% on RA. Pt denies SOB during ambulation. RN was notified.

## 2019-01-19 ENCOUNTER — Telehealth: Payer: Self-pay | Admitting: *Deleted

## 2019-01-19 NOTE — Telephone Encounter (Signed)
Called pt and she is aware that she must meet the 3 criteria to be tested and she don't have the cough or fever so she will talk with dr s next week

## 2019-01-19 NOTE — Telephone Encounter (Signed)
Pt went to er yesterday for possible COVID 19 they sent her home to quarantine for 7 days however she was not tested and she would like to know where to go to get tested. She does not want to stay in her bedroom for 7 days if she does not have COVID. Requesting a call back to know where she should go to get tested.

## 2019-01-20 ENCOUNTER — Encounter (HOSPITAL_COMMUNITY): Payer: Self-pay | Admitting: Emergency Medicine

## 2019-01-20 ENCOUNTER — Other Ambulatory Visit: Payer: Self-pay

## 2019-01-20 ENCOUNTER — Emergency Department (HOSPITAL_COMMUNITY)
Admission: EM | Admit: 2019-01-20 | Discharge: 2019-01-20 | Disposition: A | Discharge status: Home / Self Care | Payer: 59 | Attending: Emergency Medicine | Admitting: Emergency Medicine

## 2019-01-20 DIAGNOSIS — R059 Cough, unspecified: Secondary | ICD-10-CM

## 2019-01-20 DIAGNOSIS — R0602 Shortness of breath: Secondary | ICD-10-CM | POA: Diagnosis not present

## 2019-01-20 DIAGNOSIS — R05 Cough: Secondary | ICD-10-CM | POA: Insufficient documentation

## 2019-01-20 DIAGNOSIS — I10 Essential (primary) hypertension: Secondary | ICD-10-CM | POA: Diagnosis not present

## 2019-01-20 DIAGNOSIS — Z79899 Other long term (current) drug therapy: Secondary | ICD-10-CM | POA: Insufficient documentation

## 2019-01-20 NOTE — Discharge Instructions (Addendum)
Please see the information and instructions below regarding your visit.  Your diagnoses today include:  1. Cough   2. Shortness of breath     You are diagnosed today with a viral respiratory illness. One of the major respiratory illnesses of concern circulating in our community is COVID-19. The Novel Coronavirus 2019, was first reported on in [place], [place] in late December 2019.  The outbreak was declared a public health emergency of international concern in January 2020 and on March 11th, 2020, the outbreak was declared a global pandemic.  The spread of this virus is now global with lots of media attention.  The virus has been named SARS-CoV-2 and the disease it causes has become known as coronavirus disease 2019 (COVID-19). Although symptoms can be variable, the predominant symptoms are fever, cough, and shortness of breath.   The incubation period (period during which the patient has the disease and can spread it to others but does not have symptoms of disease) is from 5-14 days.   [place] is considered to have "community spread" of COVID-19. This means that we are seeing cases of COVID-19 that we cannot connect to other known positive cases. Due to lack of testing supplies, we do not have the ability to test all patients, so we implement stringent home quarantine protocols to reduce the spread of disease. Please refer to the information below on Home Care and from the [place] to guide you on keeping your family safe and returning to normal activity.  Tests performed today include: See side panel of your discharge paperwork for testing performed today. Vital signs are listed at the bottom of these instructions.   Medications prescribed:    Take any prescribed medications only as prescribed, and any over the counter medications only as directed on the packaging.   Home care instructions:  Please follow any educational materials contained in this  packet.   Since we do not have the ability to widely test patients for COVID-19, the Centers for Disease Control and Prevention (CDC) recommend the following guidelines currently for self-quarantine during a respiratory illness:   -At least 3 days (72 hours) have passed since recovery defined as resolution of fever without the use of fever-reducing medications and improvement in respiratory symptoms (e.g., cough, shortness of breath); and, -At least 7 days have passed since symptoms first appeared.  Your illness is contagious and can be spread to others, especially during the phase of illness during which you have a fever. It cannot be cured by antibiotics or other medicines. Take basic precautions such as washing your hands often, covering your mouth when you cough or sneeze, and avoiding public places where you could spread your illness to others.   Please continue drinking plenty of fluids.  Use over-the-counter medicines as needed as directed on packaging for symptom relief.  You may also use ibuprofen or tylenol as directed on packaging for pain or fever.  Do not take multiple medicines containing Tylenol or acetaminophen to avoid taking too much of this medication.  Follow-up instructions:   Return instructions:  Please return to the Emergency Department if you experience worsening symptoms.  RETURN IMMEDIATELY IF you develop shortness of breath, chest pain, confusion or altered mental status, a new rash, become dizzy, faint, or poorly responsive, or are unable to be cared for at home. Some patients develop bacterial pneumonia as a result of viral respiratory infections. Signs and symptoms include return of fever, increased production of  green or yellow phlegm, chest pain, shortness of breath. You need to be reevaluated if you experience these symptoms. Please return if you have persistent vomiting and cannot keep down fluids or develop a fever that is not controlled by Acetaminophen  (Tylenol) or Ibuprofen (Motrin). Please return if you have any other emergent concerns.   Additional Information:   Your vital signs today were: BP (!) 154/85 (BP Location: Right Arm)    Pulse 87    Temp 98.4 F (36.9 C) (Oral)    Resp 18    LMP 10/11/2014 Comment: hysterectomy   SpO2 100%  If your blood pressure (BP) was elevated on multiple readings during this visit above 130 for the top number or above 80 for the bottom number, please have this repeated by your primary care provider within one month. --------------  Thank you for allowing [place] to participate in your care today.        Individuals who are confirmed to have, or are being evaluated for, COVID-19 should follow the prevention steps below until a healthcare provider or local or state health department says they can return to normal activities.  Stay home except to get medical care You should restrict activities outside your home, except for getting medical care. Do not go to work, school, or public areas, and do not use public transportation or taxis.  Call ahead before visiting your doctor Before your medical appointment, call the healthcare provider and tell them that you have, or are being evaluated for, COVID-19 infection. This will help the healthcare providers office take steps to keep other people from getting infected. Ask your healthcare provider to call the local or state health department.  Monitor your symptoms Seek prompt medical attention if your illness is worsening (e.g., difficulty breathing). Before going to your medical appointment, call the healthcare provider and tell them that you have, or are being evaluated for, COVID-19 infection. Ask your healthcare provider to call the local or state health department.  Wear a facemask You should wear a facemask that covers your nose and mouth when you are in the same room with other people and when you visit a healthcare provider. People who live with  or visit you should also wear a facemask while they are in the same room with you.  Separate yourself from other people in your home As much as possible, you should stay in a different room from other people in your home. Also, you should use a separate bathroom, if available.  Avoid sharing household items You should not share dishes, drinking glasses, cups, eating utensils, towels, bedding, or other items with other people in your home. After using these items, you should wash them thoroughly with soap and water.  Cover your coughs and sneezes Cover your mouth and nose with a tissue when you cough or sneeze, or you can cough or sneeze into your sleeve. Throw used tissues in a lined trash can, and immediately wash your hands with soap and water for at least 20 seconds or use an alcohol-based hand rub.  Wash your [facility] your hands often and thoroughly with soap and water for at least 20 seconds. You can use an alcohol-based hand sanitizer if soap and water are not available and if your hands are not visibly dirty. Avoid touching your eyes, nose, and mouth with unwashed hands.   Prevention Steps for Caregivers and Household Members of Individuals Confirmed to have, or Being Evaluated for, COVID-19 Infection Being Cared for in  the Home  If you live with, or provide care at home for, a person confirmed to have, or being evaluated for, COVID-19 infection please follow these guidelines to prevent infection:  Follow healthcare providers instructions Make sure that you understand and can help the patient follow any healthcare provider instructions for all care.  Provide for the patients basic needs You should help the patient with basic needs in the home and provide support for getting groceries, prescriptions, and other personal needs.  Monitor the patients symptoms If they are getting sicker, call his or her medical provider and tell them that the patient has, or is being  evaluated for, COVID-19 infection. This will help the healthcare providers office take steps to keep other people from getting infected. Ask the healthcare provider to call the local or state health department.  Limit the number of people who have contact with the patient If possible, have only one caregiver for the patient. Other household members should stay in another home or place of residence. If this is not possible, they should stay in another room, or be separated from the patient as much as possible. Use a separate bathroom, if available. Restrict visitors who do not have an essential need to be in the home.  Keep older adults, very young children, and other sick people away from the patient Keep older adults, very young children, and those who have compromised immune systems or chronic health conditions away from the patient. This includes people with chronic heart, lung, or kidney conditions, diabetes, and cancer.  Ensure good ventilation Make sure that shared spaces in the home have good air flow, such as from an air conditioner or an opened window, weather permitting.  Wash your hands often Wash your hands often and thoroughly with soap and water for at least 20 seconds. You can use an alcohol based hand sanitizer if soap and water are not available and if your hands are not visibly dirty. Avoid touching your eyes, nose, and mouth with unwashed hands. Use disposable paper towels to dry your hands. If not available, use dedicated cloth towels and replace them when they become wet.  Wear a facemask and gloves Wear a disposable facemask at all times in the room and gloves when you touch or have contact with the patients blood, body fluids, and/or secretions or excretions, such as sweat, saliva, sputum, nasal mucus, vomit, urine, or feces.  Ensure the mask fits over your nose and mouth tightly, and do not touch it during use. Throw out disposable facemasks and gloves after using  them. Do not reuse. Wash your hands immediately after removing your facemask and gloves. If your personal clothing becomes contaminated, carefully remove clothing and launder. Wash your hands after handling contaminated clothing. Place all used disposable facemasks, gloves, and other waste in a lined container before disposing them with other household waste. Remove gloves and wash your hands immediately after handling these items.  Do not share dishes, glasses, or other household items with the patient Avoid sharing household items. You should not share dishes, drinking glasses, cups, eating utensils, towels, bedding, or other items with a patient who is confirmed to have, or being evaluated for, COVID-19 infection. After the person uses these items, you should wash them thoroughly with soap and water.  Wash laundry thoroughly Immediately remove and wash clothes or bedding that have blood, body fluids, and/or secretions or excretions, such as sweat, saliva, sputum, nasal mucus, vomit, urine, or feces, on them. Wear gloves when  handling laundry from the patient. Read and follow directions on labels of laundry or clothing items and detergent. In general, wash and dry with the warmest temperatures recommended on the label.  Clean all areas the individual has used often Clean all touchable surfaces, such as counters, tabletops, doorknobs, bathroom fixtures, toilets, phones, keyboards, tablets, and bedside tables, every day. Also, clean any surfaces that may have blood, body fluids, and/or secretions or excretions on them. Wear gloves when cleaning surfaces the patient has come in contact with. Use a diluted bleach solution (e.g., dilute bleach with 1 part bleach and 10 parts water) or a household disinfectant with a label that says EPA-registered for coronaviruses. To make a bleach solution at home, add 1 tablespoon of bleach to 1 quart (4 cups) of water. For a larger supply, add  cup of bleach to 1  gallon (16 cups) of water. Read labels of cleaning products and follow recommendations provided on product labels. Labels contain instructions for safe and effective use of the cleaning product including precautions you should take when applying the product, such as wearing gloves or eye protection and making sure you have good ventilation during use of the product. Remove gloves and wash hands immediately after cleaning.  Monitor yourself for signs and symptoms of illness Caregivers and household members are considered close contacts, should monitor their health, and will be asked to limit movement outside of the home to the extent possible. Follow the monitoring steps for close contacts listed on the symptom monitoring form.   ? If you have additional questions, contact your local health department or call the epidemiologist on call at [phone] (available 24/7). ? This guidance is subject to change. For the most up-to-date guidance from [facility], please refer to their website: YouBlogs.pl

## 2019-01-20 NOTE — ED Triage Notes (Signed)
Pt reports that she works at [employer] with several coworkers that have tested positive. Was seen at [facility] on Wed for her dry cough, headache and SOB. was told to go home and quarantine since they wouldn't test her due to not being admitted. Pt reports since Wed was notified that another co-worker has tested positive.

## 2019-01-20 NOTE — ED Provider Notes (Signed)
[facility] DEPT Provider Note   CSN: [id number] Arrival date & time: 01/20/19  1513    History   Chief Complaint Chief Complaint  Patient presents with  . exposure to Covid  . Cough  . Headache  . Shortness of Breath    HPI Cheyenne Harris is a 53 y.o. female.     HPI  Patient is a 53 year old female with a past medical history of hypertension, hyperlipidemia presenting for cough, shortness of breath, nasal congestion, and need for medical clearance.  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  Patient reports that for the past 3 days she is experiencing mild shortness of breath mostly with exertion, or when she feels anxious, and began having a nonproductive cough for the past 24 hours.  She states that she is also had some nasal congestion and some sore throat with coughing.  Patient denies any chest pain, abdominal pain back, nausea, vomiting, or fever.  Patient reports that her work is requesting a test for her to go back to work.  She was instructed by her ED evaluation any [facility] 2 days ago that outpatient testing was not available and they recommended quarantine for 7 days.  Patient reports that she is 2 days into this quarantine.  Past Medical History:  Diagnosis Date  . Complication of anesthesia   . Hyperlipidemia   . Hypertension   . PONV (postoperative nausea and vomiting)     Patient Active Problem List   Diagnosis Date Noted  . Bilateral posterior neck pain 09/13/2018  . Chronic migraine 08/30/2018  . Special screening for malignant neoplasms, colon 12/30/2017  . Refused H1N1 influenza virus immunization 07/06/2017  . Dyspepsia 07/05/2017  . New persistent daily headache 02/16/2017  . Motion sickness 02/16/2017  . Allergic rhinitis 12/13/2015  . Overweight (BMI 25.0-29.9)  05/26/2015  . Essential hypertension 05/23/2015  . Hyperlipidemia LDL goal <100 01/11/2008    Past Surgical History:  Procedure Laterality Date  . ABDOMINAL HYSTERECTOMY    . BILATERAL SALPINGECTOMY Bilateral 03/12/2015   Procedure: BILATERAL SALPINGECTOMY;  Surgeon: Jonnie Kind, MD;  Location: [facility] ORS;  Service: Gynecology;  Laterality: Bilateral;  . COLONOSCOPY N/A 04/13/2018   Procedure: COLONOSCOPY;  Surgeon: Rogene Houston, MD;  Location: [facility] SUITE;  Service: Endoscopy;  Laterality: N/A;  730  . SUPRACERVICAL ABDOMINAL HYSTERECTOMY N/A 03/12/2015   Procedure: HYSTERECTOMY SUPRACERVICAL ABDOMINAL;  Surgeon: Jonnie Kind, MD;  Location: [facility] OR[facility];  Service: Gynecology;  Laterality: N/A;  . WISDOM TOOTH EXTRACTION       OB History    Gravida  2   Para  2   Term      Preterm      AB      Living  2     SAB      TAB      Ectopic      Multiple      Live Births               Home Medications    Prior to Admission medications   Medication Sig Start Date End Date Taking? Authorizing Provider  amLODipine (NORVASC) 2.5 MG tablet TAKE 1 TABLET BY MOUTH EVERY DAY 11/16/18   Fayrene Helper, MD  amLODipine (NORVASC) 5 MG tablet TAKE 1 TABLET BY MOUTH EVERY DAY 12/05/18  Fayrene Helper, MD  azithromycin [facility]) 250 MG tablet Two tablets on day one , then one tablet once daily for an additional four days 08/24/18   Fayrene Helper, MD  fexofenadine (ALLEGRA) 180 MG tablet Take 180 mg by mouth daily.    [provider]  [name] 500 MG CAPS Take 500 mg by mouth daily.     [provider]  Multiple Vitamins-Minerals (MULTIVITAMIN WITH MINERALS) tablet Take 1 tablet by mouth daily.    [provider]  ondansetron (ZOFRAN) 4 MG tablet Take one tablet two times daily as needed for nausea 09/13/18   Fayrene Helper, MD  Probiotic Product (PROBIOTIC PO) Take 1 tablet by mouth daily.    [provider]  rosuvastatin  (CRESTOR) 5 MG tablet TAKE 1 TABLET BY MOUTH EVERY DAY 11/08/18   Fayrene Helper, MD  SUMAtriptan (IMITREX) 50 MG tablet Take 1 tablet (50 mg total) by mouth every 2 (two) hours as needed for migraine. May repeat in 2 hours if headache persists or recurs. 08/30/18   Marcial Pacas, MD  vitamin E 400 UNIT capsule Take 400 Units by mouth daily.    [provider]    Family History Family History  Problem Relation Age of Onset  . Hypertension Mother   . Hypertension Sister   . Hypertension Brother   . Diabetes Maternal Grandmother   . Heart disease Maternal Grandmother   . Hypertension Sister     Social History Social History   Tobacco Use  . Smoking status: Never Smoker  . Smokeless tobacco: Never Used  Substance Use Topics  . Alcohol use: Yes    Alcohol/week: 0.0 standard drinks    Comment: occasionally  . Drug use: No     Allergies   Sulfa antibiotics   Review of Systems Review of Systems  Constitutional: Negative for chills and fever.  HENT: Positive for congestion and rhinorrhea. Negative for trouble swallowing and voice change.   Respiratory: Positive for cough and shortness of breath. Negative for chest tightness and wheezing.   Cardiovascular: Negative for chest pain and leg swelling.  Gastrointestinal: Negative for abdominal pain, nausea and vomiting.  All other systems reviewed and are negative.    Physical Exam Updated Vital Signs BP (!) 154/85 (BP Location: Right Arm)   Pulse 87   Temp 98.4 F (36.9 C) (Oral)   Resp 18   LMP 10/11/2014 Comment: hysterectomy  SpO2 100%   Physical Exam Vitals signs and nursing note reviewed.  Constitutional:      General: She is not in acute distress.    Appearance: She is well-developed.  HENT:     Head: Normocephalic and atraumatic.     Right Ear: Tympanic membrane normal.     Left Ear: Tympanic membrane normal.     Nose: No congestion or rhinorrhea.     Mouth/Throat:     Mouth: Mucous membranes are  moist.     Comments: Normal phonation. No muffled voice sounds. Patient swallows secretions without difficulty. Dentition normal. No lesions of tongue or buccal mucosa. Uvula midline. No asymmetric swelling of the posterior pharynx. No erythema of posterior pharynx. No tonsillar exuduate. No lingual swelling. No induration inferior to tongue. No submandibular tenderness, swelling, or induration.  Tissues of the neck supple. No cervical lymphadenopathy. Right TM without erythema or effusion; left TM without erythema or effusion.  Eyes:     Conjunctiva/sclera: Conjunctivae normal.     Pupils: Pupils are equal, round,  and reactive to light.  Neck:     Musculoskeletal: Normal range of motion and neck supple.  Cardiovascular:     Rate and Rhythm: Normal rate and regular rhythm.     Heart sounds: S1 normal and S2 normal. No murmur.  Pulmonary:     Effort: Pulmonary effort is normal.     Breath sounds: Normal breath sounds. No wheezing or rales.  Abdominal:     General: There is no distension.     Palpations: Abdomen is soft.     Tenderness: There is no abdominal tenderness. There is no guarding.  Musculoskeletal: Normal range of motion.        General: No deformity.  Lymphadenopathy:     Cervical: No cervical adenopathy.  Skin:    General: Skin is warm and dry.     Findings: No erythema or rash.  Neurological:     Mental Status: She is alert.     GCS: GCS eye subscore is 4. GCS verbal subscore is 5. GCS motor subscore is 6.     Comments: Cranial nerves grossly intact. Patient moves extremities symmetrically and with good coordination.  Psychiatric:        Behavior: Behavior normal.        Thought Content: Thought content normal.        Judgment: Judgment normal.      ED Treatments / Results  Labs (all labs ordered are listed, but only abnormal results are displayed) Labs Reviewed - No data to display  EKG None  Radiology No results found.  Procedures Procedures  (including critical care time)  Medications Ordered in ED Medications - No data to display   Initial Impression / Assessment and Plan / ED Course  I have reviewed the triage vital signs and the nursing notes.  Pertinent labs & imaging results that were available during my care of the patient were reviewed by me and considered in my medical decision making (see chart for details).        Patient is nontoxic-appearing, afebrile, and has normal vital signs.  Oxygen saturation is 100% on room air.  No tachypnea or tachycardia.  Patient has a normal lung exam.  Patient had a chest x-ray without evidence of cardiopulmonary disease, reviewed by me 48 hours ago.  She does not appear to have any increase in symptoms other than a nonproductive cough, and do not feel that this warrants repeating today.  Low suspicion for pulmonary embolism given normal vital signs and no precipitating risk factors.  I engaged in education on the disease course of COVID-19 with the patient and discussed the current nonavailability of outpatient testing.  Patient is in understanding of these limitations.  I continue to recommend to the patient that she quarantine for the remaining recommended time due to the nature of the incubation of this disease.  She is given return precautions for any worsening shortness of breath, cough or development of fever.  Patient is in understanding and agrees with the plan of care.  Final Clinical Impressions(s) / ED Diagnoses   Final diagnoses:  Cough  Shortness of breath    ED Discharge Orders    None       Tamala Julian 01/20/19 1634    Sherwood Gambler, MD 01/20/19 1718

## 2019-01-23 ENCOUNTER — Other Ambulatory Visit: Payer: Self-pay

## 2019-01-23 ENCOUNTER — Encounter: Payer: Self-pay | Admitting: *Deleted

## 2019-01-23 ENCOUNTER — Encounter: Payer: Self-pay | Admitting: Family Medicine

## 2019-01-23 ENCOUNTER — Ambulatory Visit (INDEPENDENT_AMBULATORY_CARE_PROVIDER_SITE_OTHER): Payer: 59 | Admitting: Family Medicine

## 2019-01-23 VITALS — BP 120/70 | Ht 64.0 in | Wt 150.0 lb

## 2019-01-23 DIAGNOSIS — E559 Vitamin D deficiency, unspecified: Secondary | ICD-10-CM | POA: Diagnosis not present

## 2019-01-23 DIAGNOSIS — Z09 Encounter for follow-up examination after completed treatment for conditions other than malignant neoplasm: Secondary | ICD-10-CM | POA: Diagnosis not present

## 2019-01-23 DIAGNOSIS — J309 Allergic rhinitis, unspecified: Secondary | ICD-10-CM

## 2019-01-23 DIAGNOSIS — R05 Cough: Secondary | ICD-10-CM | POA: Diagnosis not present

## 2019-01-23 DIAGNOSIS — I10 Essential (primary) hypertension: Secondary | ICD-10-CM

## 2019-01-23 DIAGNOSIS — Z20828 Contact with and (suspected) exposure to other viral communicable diseases: Secondary | ICD-10-CM | POA: Diagnosis not present

## 2019-01-23 DIAGNOSIS — E663 Overweight: Secondary | ICD-10-CM

## 2019-01-23 DIAGNOSIS — E785 Hyperlipidemia, unspecified: Secondary | ICD-10-CM

## 2019-01-23 MED ORDER — MONTELUKAST SODIUM 10 MG PO TABS: 10.0000 mg | ORAL_TABLET | Freq: Every day | ORAL | 3 refills | Status: DC

## 2019-01-23 MED ORDER — AMLODIPINE BESYLATE 5 MG PO TABS: 5.0000 mg | ORAL_TABLET | Freq: Every day | ORAL | 1 refills | Status: DC

## 2019-01-23 MED ORDER — AMLODIPINE BESYLATE 2.5 MG PO TABS: 2.5000 mg | ORAL_TABLET | Freq: Every day | ORAL | 1 refills | Status: DC

## 2019-01-23 MED ORDER — ROSUVASTATIN CALCIUM 5 MG PO TABS: 5.0000 mg | ORAL_TABLET | Freq: Every day | ORAL | 1 refills | Status: DC

## 2019-01-23 NOTE — Assessment & Plan Note (Signed)
   Patient re-educated about  the importance of commitment to a  minimum of 150 minutes of exercise per week as able.  The importance of healthy food choices with portion control discussed, as well as eating regularly and within a 12 hour window most days. The need to choose "clean , green" food 50 to 75% of the time is discussed, as well as to make water the primary drink and set a goal of 64 ounces water daily.  Encouraged to start a food diary,  and to consider  joining a support group. Sample diet sheets offered. Goals set by the patient for the next several months.   Weight /BMI 01/23/2019 01/18/2019 09/13/2018  WEIGHT 150 lb 150 lb 158 lb 0.6 oz  HEIGHT 5\' 4"  5\' 4"  5\' 4"   BMI 25.75 kg/m2 25.75 kg/m2 27.13 kg/m2

## 2019-01-23 NOTE — Progress Notes (Signed)
Virtual Visit via Telephone Note  I connected with Cheyenne Harris on 01/23/19 at 11:00 AM EDT by telephone and verified that I am speaking with the correct person using two identifiers.   I discussed the limitations, risks, security and privacy concerns of performing an evaluation and management service by telephone and the availability of in person appointments. I also discussed with the patient that there may be a patient responsible charge related to this service. The patient expressed understanding and agreed to proceed. 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 have been sent ( making dept), all 7 sent sent home since 01/19/2019, pt left on 01/18/2019 due to sob with walking a short distance to her car from the plant ( 0.25 mile) Has her regular allergy symptoms, headache, congestion , cough and sore throat still hving PND No fever All workers in her dept (n7 ) have been sent home since 04/16 and 7 plus the additional 12 people that she interacted with outside of the offivce  Has been to 2 separated Ed attempting screening based on recent exposure and new SOB which has not changed, I called local ED directly, unless pt sick enough for hospitalization , no testing being done as kits not available widely. States symptoms unchanged, no other family member is affected and she is self quarantined at home   Observations/Objective:  BP 120/70   Ht 5\' 4"  (1.626 m)   Wt 150 lb (68 kg)   LMP 10/11/2014 Comment: hysterectomy  BMI 25.75 kg/m   Assessment and Plan: Encounter for examination following treatment at hospital ED visits on 4/15 and 4/17 /2020 re concerns regarding COVID 19 exposure and new SOB  wanting to be tested. I spoke fiercly with ED Physician, and currently, unless pt is ill enough to require hospitalization , no screening is being done in the Ed or to our knowledge in this community based on possible exposure history only. I recommended , she could find out where the other co worker who tested positive in [place] , just being screened went for testing, however, her job has her quarantined for 2 weeks which ends this next Monday, and I have advised since she left the job on 4/15 I will send a return to work on 02/01/2019. She is advised that should symptoms worsen she return to ED . He verbalized understanding and appreciated my follow through wit direct contact with the ED  Allergic rhinitis Increased and uncontrolled symptoms, possibly  Contributing to the SOB though she denies wheezing, add singulair  Essential hypertension Controlled, no change in medication DASH diet and commitment to daily physical activity for a minimum of 30 minutes discussed and encouraged, as a part of hypertension management. The importance of attaining a healthy weight is also discussed.  BP/Weight 01/23/2019 01/20/2019 01/18/2019 09/13/2018 08/30/2018 08/24/2018 8/67/6195  Systolic BP 093 267 124 580 998 338 250  Diastolic BP 70 85 71 76 92 90 67  Wt. (Lbs) 150 - 150 158.04 156.5 160.12 148  BMI 25.75 - 25.75 27.13 26.86 27.48 25.4       Overweight (BMI 25.0-29.9)   Patient re-educated about  the  importance of commitment to a  minimum of 150 minutes of exercise per week as able.  The importance of healthy food choices with portion control discussed, as well as eating regularly and within a 12 hour window most days. The need to choose "clean , green" food 50 to 75% of the time is discussed, as well as to make water the primary drink and set a goal of 64 ounces water daily.  Encouraged to start a food diary,  and to consider  joining a support group. Sample diet sheets offered. Goals set  by the patient for the next several months.   Weight /BMI 01/23/2019 01/18/2019 09/13/2018  WEIGHT 150 lb 150 lb 158 lb 0.6 oz  HEIGHT 5\' 4"  5\' 4"  5\' 4"   BMI 25.75 kg/m2 25.75 kg/m2 27.13 kg/m2         Follow Up Instructions:    I discussed the assessment and treatment plan with the patient. The patient was provided an opportunity to ask questions and all were answered. The patient agreed with the plan and demonstrated an understanding of the instructions.   The patient was advised to call back or seek an in-person evaluation if the symptoms worsen or if the condition fails to improve as anticipated.  I provided 25 minutes of non-face-to-face time during this encounter.   Tula Nakayama, MD

## 2019-01-23 NOTE — Assessment & Plan Note (Signed)
Controlled, no change in medication DASH diet and commitment to daily physical activity for a minimum of 30 minutes discussed and encouraged, as a part of hypertension management. The importance of attaining a healthy weight is also discussed.  BP/Weight 01/23/2019 01/20/2019 01/18/2019 09/13/2018 08/30/2018 08/24/2018 06/05/5175  Systolic BP 160 737 106 269 485 462 703  Diastolic BP 70 85 71 76 92 90 67  Wt. (Lbs) 150 - 150 158.04 156.5 160.12 148  BMI 25.75 - 25.75 27.13 26.86 27.48 25.4

## 2019-01-23 NOTE — Assessment & Plan Note (Signed)
ED visits on 4/15 and 4/17 /2020 re concerns regarding COVID 19 exposure and new SOB wanting to be tested. I spoke fiercly with ED Physician, and currently, unless pt is ill enough to require hospitalization , no screening is being done in the Ed or to our knowledge in this community based on possible exposure history only. I recommended , she could find out where the other co worker who tested positive in [place] , just being screened went for testing, however, her job has her quarantined for 2 weeks which ends this next Monday, and I have advised since she left the job on 4/15 I will send a return to work on 02/01/2019. She is advised that should symptoms worsen she return to ED . He verbalized understanding and appreciated my follow through wit direct contact with the ED

## 2019-01-23 NOTE — Patient Instructions (Addendum)
Please reschedule 4/19 appt to end June, call if you need me sooner  Fasting lipid, cmp and EGFr, TSH and vit D level 1 week before June appt  Work excuse from 4/15 to return 02/01/2019.  If your symptoms worsen then you need to return to the ED, as of now, no testing is being offered for " routine screening" of people who think they may have been exposed to COVID 19 at the hospital Ed and there are no test sites that we are aware of in the vicinity If you are " sick enough to be hospitalized " and there is concern that is is due to the covid 19 virus , then you will be tested

## 2019-01-23 NOTE — Assessment & Plan Note (Signed)
Increased and uncontrolled symptoms, possibly  Contributing to the SOB though she denies wheezing, add singulair

## 2019-01-23 NOTE — Addendum Note (Signed)
Addended by: Obie Dredge A on: 01/23/2019 01:26 PM   Modules accepted: Orders

## 2019-01-30 ENCOUNTER — Encounter: Payer: Self-pay | Admitting: Family Medicine

## 2019-01-31 ENCOUNTER — Ambulatory Visit: Payer: 59 | Admitting: Family Medicine

## 2019-03-23 ENCOUNTER — Encounter: Payer: Self-pay | Admitting: Family Medicine

## 2019-03-23 ENCOUNTER — Ambulatory Visit (INDEPENDENT_AMBULATORY_CARE_PROVIDER_SITE_OTHER): Payer: 59 | Admitting: Family Medicine

## 2019-03-23 ENCOUNTER — Other Ambulatory Visit: Payer: Self-pay

## 2019-03-23 VITALS — BP 120/70 | Ht 64.0 in | Wt 155.0 lb

## 2019-03-23 DIAGNOSIS — Z1231 Encounter for screening mammogram for malignant neoplasm of breast: Secondary | ICD-10-CM

## 2019-03-23 DIAGNOSIS — E785 Hyperlipidemia, unspecified: Secondary | ICD-10-CM

## 2019-03-23 DIAGNOSIS — IMO0002 Reserved for concepts with insufficient information to code with codable children: Secondary | ICD-10-CM

## 2019-03-23 DIAGNOSIS — I10 Essential (primary) hypertension: Secondary | ICD-10-CM | POA: Diagnosis not present

## 2019-03-23 DIAGNOSIS — G43709 Chronic migraine without aura, not intractable, without status migrainosus: Secondary | ICD-10-CM | POA: Diagnosis not present

## 2019-03-23 DIAGNOSIS — E663 Overweight: Secondary | ICD-10-CM

## 2019-03-23 MED ORDER — AMLODIPINE BESYLATE 5 MG PO TABS: 5.0000 mg | ORAL_TABLET | Freq: Every day | ORAL | 1 refills | Status: DC

## 2019-03-23 MED ORDER — AMLODIPINE BESYLATE 2.5 MG PO TABS: 2.5000 mg | ORAL_TABLET | Freq: Every day | ORAL | 1 refills | Status: DC

## 2019-03-23 MED ORDER — ROSUVASTATIN CALCIUM 5 MG PO TABS: 5.0000 mg | ORAL_TABLET | Freq: Every day | ORAL | 1 refills | Status: DC

## 2019-03-23 NOTE — Patient Instructions (Addendum)
Annual physical exam , no pap, in November, call if you need me sooner  Mammogram scheduled and appt per your request late afternoon or early Saturday morning ( if available)  Please get fasting labs as soon as possible, they are overdue  It is important that you exercise regularly at least 30 minutes 5 times a week. If you develop chest pain, have severe difficulty breathing, or feel very tired, stop exercising immediately and seek medical attention , Good that you have started doing this regularly!  Continue to reduce intake of sugar , unless in fruit   Social distancing.avoid crowds Frequent hand washing with soap and water Keeping your hands off of your face.weatr a mask outside of your home These 3 practices will help to keep both you and your community healthy during this time. Please practice them faithfully! Thanks for choosing [facility], we consider it a privelige to serve you.

## 2019-03-23 NOTE — Progress Notes (Signed)
Virtual Visit via Telephone Note  I connected with Kathyrn Lass on 03/23/19 at  1:40 PM EDT by telephone and verified that I am speaking with the correct person using two identifiers.  Location: Patient: home, working Provider: office   I discussed the limitations, risks, security and privacy concerns of performing an evaluation and management service by telephone and the availability of in person appointments. I also discussed with the patient that there may be a patient responsible charge related to this service. The patient expressed understanding and agreed to proceed.   History of Present Illness: F/u chronoic problems and ,medication management Denies recent fever or chills. Denies sinus pressure, nasal congestion, ear pain or sore throat. Denies chest congestion, productive cough or wheezing. Denies chest pains, palpitations and leg swelling Denies abdominal pain, nausea, vomiting,diarrhea or constipation.   Denies dysuria, frequency, hesitancy or incontinence. Denies joint pain, swelling and limitation in mobility. Denies headaches, seizures, numbness, or tingling. Denies depression, anxiety or insomnia. Denies skin break down or rash.       Observations/Objective: BP 120/70   Ht 5\' 4"  (1.626 m)   Wt 150 lb (68 kg)   LMP 10/11/2014 Comment: hysterectomy  BMI 25.75 kg/m   Good communication with no confusion and intact memory. Alert and oriented x 3 No signs of respiratory distress during speech     Assessment and Plan: Essential hypertension Controlled, no change in medication DASH diet and commitment to daily physical activity for a minimum of 30 minutes discussed and encouraged, as a part of hypertension management. The importance of attaining a healthy weight is also discussed.  BP/Weight 03/23/2019 01/23/2019 01/20/2019 01/18/2019 09/13/2018 08/30/2018 78/93/8101  Systolic BP 751 025 852 778 242 353 614  Diastolic BP 70 70 85 71 76 92 90  Wt. (Lbs) 155  150 - 150 158.04 156.5 160.12  BMI 26.61 25.75 - 25.75 27.13 26.86 27.48       Hyperlipidemia LDL goal <100 Hyperlipidemia:Low fat diet discussed and encouraged.   Lipid Panel  Lab Results  Component Value Date   CHOL 155 12/15/2017   HDL 61 12/15/2017   LDLCALC 79 12/15/2017   TRIG 66 12/15/2017   CHOLHDL 2.5 12/15/2017     Updated lab needed at/ before next visit.   Chronic migraine Improved, less frequent , uses Excedrin migraine , no triptan  Overweight (BMI 25.0-29.9)  Patient re-educated about  the importance of commitment to a  minimum of 150 minutes of exercise per week as able.  The importance of healthy food choices with portion control discussed, as well as eating regularly and within a 12 hour window most days. The need to choose "clean , green" food 50 to 75% of the time is discussed, as well as to make water the primary drink and set a goal of 64 ounces water daily.    Weight /BMI 03/23/2019 01/23/2019 01/18/2019  WEIGHT 155 lb 150 lb 150 lb  HEIGHT 5\' 4"  5\' 4"  5\' 4"   BMI 26.61 kg/m2 25.75 kg/m2 25.75 kg/m2        Follow Up Instructions:    I discussed the assessment and treatment plan with the patient. The patient was provided an opportunity to ask questions and all were answered. The patient agreed with the plan and demonstrated an understanding of the instructions.   The patient was advised to call back or seek an in-person evaluation if the symptoms worsen or if the condition fails to improve as anticipated.  I provided 25 minutes  of non-face-to-face time during this encounter.   Tula Nakayama, MD

## 2019-03-24 ENCOUNTER — Encounter: Payer: Self-pay | Admitting: Family Medicine

## 2019-03-24 NOTE — Assessment & Plan Note (Signed)
Hyperlipidemia:Low fat diet discussed and encouraged.   Lipid Panel  Lab Results  Component Value Date   CHOL 155 12/15/2017   HDL 61 12/15/2017   LDLCALC 79 12/15/2017   TRIG 66 12/15/2017   CHOLHDL 2.5 12/15/2017     Updated lab needed at/ before next visit.

## 2019-03-24 NOTE — Assessment & Plan Note (Signed)
Controlled, no change in medication DASH diet and commitment to daily physical activity for a minimum of 30 minutes discussed and encouraged, as a part of hypertension management. The importance of attaining a healthy weight is also discussed.  BP/Weight 03/23/2019 01/23/2019 01/20/2019 01/18/2019 09/13/2018 08/30/2018 44/81/8563  Systolic BP 149 702 637 858 850 277 412  Diastolic BP 70 70 85 71 76 92 90  Wt. (Lbs) 155 150 - 150 158.04 156.5 160.12  BMI 26.61 25.75 - 25.75 27.13 26.86 27.48

## 2019-03-24 NOTE — Assessment & Plan Note (Signed)
Improved, less frequent , uses Excedrin migraine , no triptan

## 2019-03-24 NOTE — Assessment & Plan Note (Signed)
  Patient re-educated about  the importance of commitment to a  minimum of 150 minutes of exercise per week as able.  The importance of healthy food choices with portion control discussed, as well as eating regularly and within a 12 hour window most days. The need to choose "clean , green" food 50 to 75% of the time is discussed, as well as to make water the primary drink and set a goal of 64 ounces water daily.    Weight /BMI 03/23/2019 01/23/2019 01/18/2019  WEIGHT 155 lb 150 lb 150 lb  HEIGHT 5\' 4"  5\' 4"  5\' 4"   BMI 26.61 kg/m2 25.75 kg/m2 25.75 kg/m2

## 2019-03-29 ENCOUNTER — Ambulatory Visit: Payer: 59 | Admitting: Family Medicine

## 2019-04-03 ENCOUNTER — Other Ambulatory Visit: Payer: Self-pay

## 2019-04-03 ENCOUNTER — Ambulatory Visit (HOSPITAL_COMMUNITY)
Admission: RE | Admit: 2019-04-03 | Discharge: 2019-04-03 | Disposition: A | Discharge status: Home / Self Care | Payer: 59 | Source: Ambulatory Visit | Attending: Family Medicine | Admitting: Family Medicine

## 2019-04-03 DIAGNOSIS — Z1231 Encounter for screening mammogram for malignant neoplasm of breast: Secondary | ICD-10-CM | POA: Insufficient documentation

## 2019-04-07 ENCOUNTER — Encounter: Payer: Self-pay | Admitting: Family Medicine

## 2019-04-07 LAB — COMPLETE METABOLIC PANEL WITH GFR
AG Ratio: 1.4 (calc) (ref 1.0–2.5)
ALT: 12 U/L (ref 6–29)
AST: 18 U/L (ref 10–35)
Albumin: 4.2 g/dL (ref 3.6–5.1)
Alkaline phosphatase (APISO): 90 U/L (ref 37–153)
BUN: 14 mg/dL (ref 7–25)
CO2: 28 mmol/L (ref 20–32)
Calcium: 9.5 mg/dL (ref 8.6–10.4)
Chloride: 106 mmol/L (ref 98–110)
Creat: 0.91 mg/dL (ref 0.50–1.05)
GFR, Est African American: 84 mL/min/{1.73_m2} (ref >=60)
GFR, Est Non African American: 73 mL/min/{1.73_m2} (ref >=60)
Globulin: 2.9 g/dL (calc) (ref 1.9–3.7)
Glucose, Bld: 89 mg/dL (ref 65–99)
Potassium: 4.1 mmol/L (ref 3.5–5.3)
Sodium: 140 mmol/L (ref 135–146)
Total Bilirubin: 0.4 mg/dL (ref 0.2–1.2)
Total Protein: 7.1 g/dL (ref 6.1–8.1)

## 2019-04-07 LAB — LIPID PANEL
Cholesterol: 177 mg/dL (ref <200)
HDL: 74 mg/dL (ref >=50)
LDL Cholesterol (Calc): 89 mg/dL (calc)
Non-HDL Cholesterol (Calc): 103 mg/dL (calc) (ref <130)
Total CHOL/HDL Ratio: 2.4 (calc) (ref <5.0)
Triglycerides: 54 mg/dL (ref <150)

## 2019-04-07 LAB — TSH: TSH: 1.86 mIU/L

## 2019-04-07 LAB — VITAMIN D 25 HYDROXY (VIT D DEFICIENCY, FRACTURES): Vit D, 25-Hydroxy: 51 ng/mL (ref 30–100)

## 2019-07-18 ENCOUNTER — Other Ambulatory Visit: Payer: Self-pay

## 2019-07-18 ENCOUNTER — Ambulatory Visit
Admission: EM | Admit: 2019-07-18 | Discharge: 2019-07-18 | Disposition: A | Discharge status: Home / Self Care | Payer: 59 | Attending: Emergency Medicine | Admitting: Emergency Medicine

## 2019-07-18 DIAGNOSIS — R519 Headache, unspecified: Secondary | ICD-10-CM

## 2019-07-18 DIAGNOSIS — H8110 Benign paroxysmal vertigo, unspecified ear: Secondary | ICD-10-CM

## 2019-07-18 DIAGNOSIS — Z20822 Contact with and (suspected) exposure to covid-19: Secondary | ICD-10-CM

## 2019-07-18 DIAGNOSIS — Z20828 Contact with and (suspected) exposure to other viral communicable diseases: Secondary | ICD-10-CM | POA: Diagnosis not present

## 2019-07-18 MED ORDER — FLUTICASONE PROPIONATE 50 MCG/ACT NA SUSP: 2.0000 | Freq: Every day | NASAL | 0 refills | Status: DC

## 2019-07-18 MED ORDER — MECLIZINE HCL 25 MG PO TABS: 25.0000 mg | ORAL_TABLET | Freq: Two times a day (BID) | ORAL | 0 refills | Status: DC | PRN

## 2019-07-18 MED ORDER — CETIRIZINE HCL 10 MG PO CHEW: 10.0000 mg | CHEWABLE_TABLET | Freq: Every day | ORAL | 0 refills | Status: DC

## 2019-07-18 NOTE — ED Triage Notes (Signed)
Pt has headache with fever and dizziness for past 4 days, just returned from beach

## 2019-07-18 NOTE — ED Provider Notes (Signed)
[place]   [id number] 07/18/19 Arrival Time: 1010   CC: Headache, dizziness, covid test  SUBJECTIVE: History from: patient.  Cheyenne Harris is a 53 y.o. female hx significant for anesthesia complication, HLD, HTN, and PONV, complains of dizziness that began few days ago, and frontal headache that began 4-5 days ago.  Denies sick exposure to COVID, flu or strep.  Admits to recent travel from [facility].  Describes the dizziness as "the room spinning."  States that it is intermittent with episodes lasting few minutes at a time.  Has tried allegra without relief.  Symptoms made worse with position changes.  Denies previous symptoms in the past.  Complains of congestion, sore throat, and temp of 99.  Denies chills, nausea, vomiting, hearing changes, tinnitus, ear pain, chest pain, syncope, SOB, weakness, slurred speech, memory or emotional changes, facial drooping/ asymmetry, incoordination, numbness or tingling, abdominal pain, changes in bowel or bladder habits.    ROS: As per HPI.  All other pertinent ROS negative.     Past Medical History:  Diagnosis Date  . Complication of anesthesia   . Hyperlipidemia   . Hypertension   . PONV (postoperative nausea and vomiting)    Past Surgical History:  Procedure Laterality Date  . ABDOMINAL HYSTERECTOMY    . BILATERAL SALPINGECTOMY Bilateral 03/12/2015   Procedure: BILATERAL SALPINGECTOMY;  Surgeon: Jonnie Kind, MD;  Location: [facility];  Service: Gynecology;  Laterality: Bilateral;  . COLONOSCOPY N/A 04/13/2018   Procedure: COLONOSCOPY;  Surgeon: Rogene Houston, MD;  Location: [facility];  Service: Endoscopy;  Laterality: N/A;  730  . SUPRACERVICAL ABDOMINAL HYSTERECTOMY N/A 03/12/2015   Procedure: HYSTERECTOMY SUPRACERVICAL ABDOMINAL;  Surgeon: Jonnie Kind, MD;  Location: [facility];  Service: Gynecology;  Laterality: N/A;  . WISDOM TOOTH EXTRACTION     Allergies  Allergen Reactions  . Sulfa Antibiotics Hives   No  current facility-administered medications on file prior to encounter.    Current Outpatient Medications on File Prior to Encounter  Medication Sig Dispense Refill  . amLODipine (NORVASC) 2.5 MG tablet Take 1 tablet (2.5 mg total) by mouth daily. 90 tablet 1  . amLODipine (NORVASC) 5 MG tablet Take 1 tablet (5 mg total) by mouth daily. 90 tablet 1  . fexofenadine (ALLEGRA) 180 MG tablet Take 180 mg by mouth daily.    [name] 500 MG CAPS Take 500 mg by mouth daily.     . Multiple Vitamins-Minerals (MULTIVITAMIN WITH MINERALS) tablet Take 1 tablet by mouth daily.    . Probiotic Product (PROBIOTIC PO) Take 1 tablet by mouth daily.    . rosuvastatin (CRESTOR) 5 MG tablet Take 1 tablet (5 mg total) by mouth daily. 90 tablet 1   Social History   Socioeconomic History  . Marital status: Married    Spouse name: Not on file  . Number of children: Not on file  . Years of education: Not on file  . Highest education level: Not on file  Occupational History  . Not on file  Social Needs  . Financial resource strain: Not on file  . Food insecurity    Worry: Not on file    Inability: Not on file  . Transportation needs    Medical: Not on file    Non-medical: Not on file  Tobacco Use  . Smoking status: Never Smoker  . Smokeless tobacco: Never Used  Substance and Sexual Activity  . Alcohol use: Yes    Alcohol/week: 0.0 standard  drinks    Comment: occasionally  . Drug use: No  . Sexual activity: Yes    Birth control/protection: Surgical  Lifestyle  . Physical activity    Days per week: Not on file    Minutes per session: Not on file  . Stress: Not on file  Relationships  . Social Herbalist on phone: Not on file    Gets together: Not on file    Attends religious service: Not on file    Active member of club or organization: Not on file    Attends meetings of clubs or organizations: Not on file    Relationship status: Not on file  . Intimate partner violence    Fear of  current or ex partner: Not on file    Emotionally abused: Not on file    Physically abused: Not on file    Forced sexual activity: Not on file  Other Topics Concern  . Not on file  Social History Narrative  . Not on file   Family History  Problem Relation Age of Onset  . Hypertension Mother   . Hypertension Sister   . Hypertension Brother   . Diabetes Maternal Grandmother   . Heart disease Maternal Grandmother   . Hypertension Sister     OBJECTIVE:  Vitals:   07/18/19 1026  BP: 136/84  Pulse: 66  Resp: 18  Temp: 98 F (36.7 C)  SpO2: 99%     General appearance: alert; well-appearing nontoxic; speaking in full sentences and tolerating own secretions HEENT: NCAT; Ears: EACs clear, TMs pearly gray; Eyes: PERRL.  EOM grossly intact. Nose: nares patent without rhinorrhea, Throat: oropharynx clear, tonsils non erythematous or enlarged, uvula midline  Neck: supple without LAD Lungs: unlabored respirations, symmetrical air entry; cough: absent; no respiratory distress; CTAB Heart: regular rate and rhythm.  Radial pulses 2+ symmetrical bilaterally Skin: warm and dry Neuro: CN 2-12 grossly intact; strength and sensation intact about the upper and lower extremities; negative pronator drift; finger to nose without difficulty; ambulates without difficulty; negative dix-hallpike maneuver Psychological: alert and cooperative; normal mood and affect  ASSESSMENT & PLAN:  1. Suspected COVID-19 virus infection   2. Benign paroxysmal positional vertigo, unspecified laterality     Meds ordered this encounter  Medications  . cetirizine (ZYRTEC) 10 MG chewable tablet    Sig: Chew 1 tablet (10 mg total) by mouth daily.    Dispense:  20 tablet    Refill:  0    Order Specific Question:   Supervising Provider    Answer:   Raylene Everts [id number]  . fluticasone (FLONASE) 50 MCG/ACT nasal spray    Sig: Place 2 sprays into both nostrils daily.    Dispense:  16 g    Refill:  0     Order Specific Question:   Supervising Provider    Answer:   Raylene Everts [id number]  . meclizine (ANTIVERT) 25 MG tablet    Sig: Take 1 tablet (25 mg total) by mouth 2 (two) times daily as needed for dizziness.    Dispense:  30 tablet    Refill:  0    Order Specific Question:   Supervising Provider    Answer:   Raylene Everts [id number]   Offered patient further evaluation and management in the ED for dizziness.  Symptoms consistent with vertigo.  Low suspicion for stroke, however, cannot rule out in Urgent Care setting.  Patient declines at this time.  Would like to  try outpatient therapy first.  Given strict ED precautions.    COVID testing ordered.  It will take between 5-7 days for test results.  Someone will contact you regarding abnormal results.    In the meantime: You should remain isolated in your home for 10 days from symptom onset AND greater than 72 hours after symptoms resolution (absence of fever without the use of fever-reducing medication and improvement in respiratory symptoms), whichever is longer Get plenty of rest and push fluids Zyrtec prescribed for nasal congestion, runny nose, and/or sore throat Flonase prescribed for nasal congestion and runny nose Use medications daily for symptom relief Use OTC medications like ibuprofen or tylenol as needed fever or pain Meclizine sent to pharmacy on file for possible vertigo.  Advise patient to not take before driving or operating heavy machinery as this medication may make you drowsy Follow up with PCP this week or early next week for recheck and to ensure your symptoms are improving Call or go to the ED if you have any new or worsening symptoms such as fever, worsening cough, shortness of breath, chest tightness, chest pain, turning blue, changes in mental status, etc...   Reviewed expectations re: course of current medical issues. Questions answered. Outlined signs and symptoms indicating need for more acute  intervention. Patient verbalized understanding. After Visit Summary given.         Lestine Box, PA-C 07/18/19 1254

## 2019-07-18 NOTE — Discharge Instructions (Signed)
COVID testing ordered.  It will take between 5-7 days for test results.  Someone will contact you regarding abnormal results.    In the meantime: You should remain isolated in your home for 10 days from symptom onset AND greater than 72 hours after symptoms resolution (absence of fever without the use of fever-reducing medication and improvement in respiratory symptoms), whichever is longer Get plenty of rest and push fluids Zyrtec-D prescribed for nasal congestion, runny nose, and/or sore throat Flonase prescribed for nasal congestion and runny nose Use medications daily for symptom relief Use OTC medications like ibuprofen or tylenol as needed fever or pain Follow up with PCP this week or early next week for recheck and to ensure your symptoms are improving Call or go to the ED if you have any new or worsening symptoms such as fever, worsening cough, shortness of breath, chest tightness, chest pain, turning blue, changes in mental status, etc..Marland Kitchen

## 2019-07-20 LAB — NOVEL CORONAVIRUS, NAA: SARS-CoV-2, NAA: NOT DETECTED

## 2019-08-29 ENCOUNTER — Encounter: Payer: 59 | Admitting: Family Medicine

## 2019-09-22 ENCOUNTER — Other Ambulatory Visit: Payer: Self-pay | Admitting: Family Medicine

## 2019-09-25 ENCOUNTER — Other Ambulatory Visit: Payer: Self-pay

## 2019-09-25 ENCOUNTER — Ambulatory Visit: Payer: 59 | Admitting: Family Medicine

## 2019-09-25 ENCOUNTER — Encounter: Payer: Self-pay | Admitting: Family Medicine

## 2019-09-25 VITALS — BP 150/92 | HR 67 | Temp 97.7°F | Resp 15 | Ht 64.0 in | Wt 150.0 lb

## 2019-09-25 DIAGNOSIS — I10 Essential (primary) hypertension: Secondary | ICD-10-CM | POA: Diagnosis not present

## 2019-09-25 DIAGNOSIS — E785 Hyperlipidemia, unspecified: Secondary | ICD-10-CM | POA: Diagnosis not present

## 2019-09-25 DIAGNOSIS — Z0000 Encounter for general adult medical examination without abnormal findings: Secondary | ICD-10-CM | POA: Diagnosis not present

## 2019-09-25 MED ORDER — ROSUVASTATIN CALCIUM 5 MG PO TABS: 5.0000 mg | ORAL_TABLET | Freq: Every day | ORAL | 1 refills | Status: DC

## 2019-09-25 NOTE — Patient Instructions (Addendum)
F/U in office with MD, for re evaluation of blood pressure and lab review, call if you need me sooner   Blood pressure is high, recommit to amlodipine 5 mg every am at 8:30 am and amlodipine 2.5 mg one every evening a t8: 30 pm  It is important that you exercise regularly at least 30 minutes 5 times a week. If you develop chest pain, have severe difficulty breathing, or feel very tired, stop exercising immediately and seek medical attention   Weight loss DOES lower blood pressure  So work at this  Please get cBC,  Fasting lipid, cmp and eGFR    Hypertension, Adult Hypertension is another name for high blood pressure. High blood pressure forces your heart to work harder to pump blood. This can cause problems over time. There are two numbers in a blood pressure reading. There is a top number (systolic) over a bottom number (diastolic). It is best to have a blood pressure that is below 120/80. Healthy choices can help lower your blood pressure, or you may need medicine to help lower it. What are the causes? The cause of this condition is not known. Some conditions may be related to high blood pressure. What increases the risk?  Smoking.  Having type 2 diabetes mellitus, high cholesterol, or both.  Not getting enough exercise or physical activity.  Being overweight.  Having too much fat, sugar, calories, or salt (sodium) in your diet.  Drinking too much alcohol.  Having long-term (chronic) kidney disease.  Having a family history of high blood pressure.  Age. Risk increases with age.  Race. You may be at higher risk if you are African American.  Gender. Men are at higher risk than women before age 30. After age 32, women are at higher risk than men.  Having obstructive sleep apnea.  Stress. What are the signs or symptoms?  High blood pressure may not cause symptoms. Very high blood pressure (hypertensive crisis) may cause: ? Headache. ? Feelings of worry or nervousness  (anxiety). ? Shortness of breath. ? Nosebleed. ? A feeling of being sick to your stomach (nausea). ? Throwing up (vomiting). ? Changes in how you see. ? Very bad chest pain. ? Seizures. How is this treated?  This condition is treated by making healthy lifestyle changes, such as: ? Eating healthy foods. ? Exercising more. ? Drinking less alcohol.  Your health care provider may prescribe medicine if lifestyle changes are not enough to get your blood pressure under control, and if: ? Your top number is above 130. ? Your bottom number is above 80.  Your personal target blood pressure may vary. Follow these instructions at home: Eating and drinking   If told, follow the DASH eating plan. To follow this plan: ? Fill one half of your plate at each meal with fruits and vegetables. ? Fill one fourth of your plate at each meal with whole grains. Whole grains include whole-wheat pasta, brown rice, and whole-grain bread. ? Eat or drink low-fat dairy products, such as skim milk or low-fat yogurt. ? Fill one fourth of your plate at each meal with low-fat (lean) proteins. Low-fat proteins include fish, chicken without skin, eggs, beans, and tofu. ? Avoid fatty meat, cured and processed meat, or chicken with skin. ? Avoid pre-made or processed food.  Eat less than 1,500 mg of salt each day.  Do not drink alcohol if: ? Your doctor tells you not to drink. ? You are pregnant, may be pregnant, or are  planning to become pregnant.  If you drink alcohol: ? Limit how much you use to:  0-1 drink a day for women.  0-2 drinks a day for men. ? Be aware of how much alcohol is in your drink. In the [place] one drink equals one 12 oz bottle of beer (355 mL), one 5 oz glass of wine (148 mL), or one 1 oz glass of hard liquor (44 mL). Lifestyle   Work with your doctor to stay at a healthy weight or to lose weight. Ask your doctor what the best weight is for you.  Get at least 30 minutes of exercise  most days of the week. This may include walking, swimming, or biking.  Get at least 30 minutes of exercise that strengthens your muscles (resistance exercise) at least 3 days a week. This may include lifting weights or doing Pilates.  Do not use any products that contain nicotine or tobacco, such as cigarettes, e-cigarettes, and chewing tobacco. If you need help quitting, ask your doctor.  Check your blood pressure at home as told by your doctor.  Keep all follow-up visits as told by your doctor. This is important. Medicines  Take over-the-counter and prescription medicines only as told by your doctor. Follow directions carefully.  Do not skip doses of blood pressure medicine. The medicine does not work as well if you skip doses. Skipping doses also puts you at risk for problems.  Ask your doctor about side effects or reactions to medicines that you should watch for. Contact a doctor if you:  Think you are having a reaction to the medicine you are taking.  Have headaches that keep coming back (recurring).  Feel dizzy.  Have swelling in your ankles.  Have trouble with your vision. Get help right away if you:  Get a very bad headache.  Start to feel mixed up (confused).  Feel weak or numb.  Feel faint.  Have very bad pain in your: ? Chest. ? Belly (abdomen).  Throw up more than once.  Have trouble breathing. Summary  Hypertension is another name for high blood pressure.  High blood pressure forces your heart to work harder to pump blood.  For most people, a normal blood pressure is less than 120/80.  Making healthy choices can help lower blood pressure. If your blood pressure does not get lower with healthy choices, you may need to take medicine. This information is not intended to replace advice given to you by your health care provider. Make sure you discuss any questions you have with your health care provider. Document Released: 03/09/2008 Document Revised:  06/01/2018 Document Reviewed: 06/01/2018 Elsevier Patient Education  2020 [employer].

## 2019-09-25 NOTE — Progress Notes (Signed)
    Cheyenne Harris     MRN: [id number]      DOB: Nov 05, 1965  HPI: Patient is in for annual physical exam. Uncontrolled hypertension is addressed, ha stopped one of her amlodipine tabs, stated it made her light headed Recent labs, if available are reviewed. Immunization is reviewed , and  updated if needed.   PE:  BP (!) 150/92   Pulse 67   Temp 97.7 F (36.5 C) (Temporal)   Resp 15   Ht 5\' 4"  (1.626 m)   Wt 150 lb (68 kg)   LMP 10/11/2014 Comment: hysterectomy  SpO2 98%   BMI 25.75 kg/m   Pleasant  female, alert and oriented x 3, in no cardio-pulmonary distress. Afebrile. HEENT No facial trauma or asymetry. Sinuses non tender.  Extra occullar muscles intact.. External ears normal, . Neck: supple, no adenopathy,JVD or thyromegaly.No bruits.  Chest: Clear to ascultation bilaterally.No crackles or wheezes. Non tender to palpation  Breast: No asymetry,no masses or lumps. No tenderness. No nipple discharge or inversion. No axillary or supraclavicular adenopathy  Cardiovascular system; Heart sounds normal,  S1 and  S2 ,no S3.  No murmur, or thrill. Apical beat not displaced Peripheral pulses normal.  Abdomen: Soft, non tender, no organomegaly or masses. No bruits. Bowel sounds normal. No guarding, tenderness or rebound.   .   Musculoskeletal exam: Full ROM of spine, hips , shoulders and knees. No deformity ,swelling or crepitus noted. No muscle wasting or atrophy.   Neurologic: Cranial nerves 2 to 12 intact. Power, tone ,sensation and reflexes normal throughout. No disturbance in gait. No tremor.  Skin: Intact, no ulceration, erythema , scaling or rash noted. Pigmentation normal throughout  Psych; Normal mood and affect. Judgement and concentration normal   Assessment & Plan:  Annual physical exam Annual exam as documented. Counseling done  re healthy lifestyle involving commitment to 150 minutes exercise per week, heart healthy diet, and  attaining healthy weight.The importance of adequate sleep also discussed. Regular seat belt use and home safety, is also discussed. Changes in health habits are decided on by the patient with goals and time frames  set for achieving them. Immunization and cancer screening needs are specifically addressed at this visit.   Essential hypertension Uncontrolled, will take additional 2.5 mg amlodipine 12 hrs after the 5 mg tablet DASH diet and commitment to daily physical activity for a minimum of 30 minutes discussed and encouraged, as a part of hypertension management. The importance of attaining a healthy weight is also discussed.  BP/Weight 09/25/2019 07/18/2019 03/23/2019 01/23/2019 01/20/2019 01/18/2019 [id number]  Systolic BP Q000111Q [id number] [id number] 123456 123456 [id number]  Diastolic BP 92 84 70 70 85 71 76  Wt. (Lbs) 150 - 155 150 - 150 158.04  BMI 25.75 - 26.61 25.75 - 25.75 27.13

## 2019-09-27 ENCOUNTER — Encounter: Payer: Self-pay | Admitting: Family Medicine

## 2019-09-27 NOTE — Assessment & Plan Note (Signed)
Uncontrolled, will take additional 2.5 mg amlodipine 12 hrs after the 5 mg tablet DASH diet and commitment to daily physical activity for a minimum of 30 minutes discussed and encouraged, as a part of hypertension management. The importance of attaining a healthy weight is also discussed.  BP/Weight 09/25/2019 07/18/2019 03/23/2019 01/23/2019 01/20/2019 01/18/2019 [id number]  Systolic BP Q000111Q [id number] 123456 123456 123456 [id number]  Diastolic BP 92 84 70 70 85 71 76  Wt. (Lbs) 150 - 155 150 - 150 158.04  BMI 25.75 - 26.61 25.75 - 25.75 27.13

## 2019-09-27 NOTE — Assessment & Plan Note (Signed)

## 2019-12-04 ENCOUNTER — Encounter: Payer: Self-pay | Admitting: Family Medicine

## 2019-12-04 LAB — CBC
HCT: 40.3 % (ref 35.0–45.0)
Hemoglobin: 13.2 g/dL (ref 11.7–15.5)
MCH: 28.6 pg (ref 27.0–33.0)
MCHC: 32.8 g/dL (ref 32.0–36.0)
MCV: 87.2 fL (ref 80.0–100.0)
MPV: 12.4 fL (ref 7.5–12.5)
Platelets: 181 10*3/uL (ref 140–400)
RBC: 4.62 10*6/uL (ref 3.80–5.10)
RDW: 12.5 % (ref 11.0–15.0)
WBC: 4.1 10*3/uL (ref 3.8–10.8)

## 2019-12-04 LAB — LIPID PANEL
Cholesterol: 185 mg/dL (ref <200)
HDL: 70 mg/dL (ref >=50)
LDL Cholesterol (Calc): 99 mg/dL (calc)
Non-HDL Cholesterol (Calc): 115 mg/dL (calc) (ref <130)
Total CHOL/HDL Ratio: 2.6 (calc) (ref <5.0)
Triglycerides: 73 mg/dL (ref <150)

## 2019-12-04 LAB — COMPLETE METABOLIC PANEL WITH GFR
AG Ratio: 1.6 (calc) (ref 1.0–2.5)
ALT: 12 U/L (ref 6–29)
AST: 16 U/L (ref 10–35)
Albumin: 4.2 g/dL (ref 3.6–5.1)
Alkaline phosphatase (APISO): 101 U/L (ref 37–153)
BUN: 17 mg/dL (ref 7–25)
CO2: 29 mmol/L (ref 20–32)
Calcium: 9.5 mg/dL (ref 8.6–10.4)
Chloride: 104 mmol/L (ref 98–110)
Creat: 0.87 mg/dL (ref 0.50–1.05)
GFR, Est African American: 89 mL/min/{1.73_m2} (ref >=60)
GFR, Est Non African American: 77 mL/min/{1.73_m2} (ref >=60)
Globulin: 2.6 g/dL (calc) (ref 1.9–3.7)
Glucose, Bld: 85 mg/dL (ref 65–99)
Potassium: 4.1 mmol/L (ref 3.5–5.3)
Sodium: 140 mmol/L (ref 135–146)
Total Bilirubin: 0.6 mg/dL (ref 0.2–1.2)
Total Protein: 6.8 g/dL (ref 6.1–8.1)

## 2019-12-05 ENCOUNTER — Ambulatory Visit (INDEPENDENT_AMBULATORY_CARE_PROVIDER_SITE_OTHER): Payer: 59 | Admitting: Family Medicine

## 2019-12-05 ENCOUNTER — Encounter: Payer: Self-pay | Admitting: Family Medicine

## 2019-12-05 ENCOUNTER — Other Ambulatory Visit: Payer: Self-pay

## 2019-12-05 VITALS — BP 134/79 | HR 82 | Ht 64.0 in | Wt 145.0 lb

## 2019-12-05 DIAGNOSIS — E663 Overweight: Secondary | ICD-10-CM | POA: Diagnosis not present

## 2019-12-05 DIAGNOSIS — E785 Hyperlipidemia, unspecified: Secondary | ICD-10-CM

## 2019-12-05 DIAGNOSIS — J321 Chronic frontal sinusitis: Secondary | ICD-10-CM | POA: Diagnosis not present

## 2019-12-05 DIAGNOSIS — E559 Vitamin D deficiency, unspecified: Secondary | ICD-10-CM

## 2019-12-05 DIAGNOSIS — I10 Essential (primary) hypertension: Secondary | ICD-10-CM

## 2019-12-05 DIAGNOSIS — Z6824 Body mass index (BMI) 24.0-24.9, adult: Secondary | ICD-10-CM

## 2019-12-05 MED ORDER — AMLODIPINE BESYLATE 2.5 MG PO TABS: 2.5000 mg | ORAL_TABLET | Freq: Every day | ORAL | 3 refills | Status: DC

## 2019-12-05 MED ORDER — AZITHROMYCIN 250 MG PO TABS: ORAL_TABLET | ORAL | 0 refills | Status: DC

## 2019-12-05 NOTE — Assessment & Plan Note (Signed)
  Patient re-educated about  the importance of commitment to a  minimum of 150 minutes of exercise per week as able.  The importance of healthy food choices with portion control discussed, as well as eating regularly and within a 12 hour window most days. The need to choose "clean , green" food 50 to 75% of the time is discussed, as well as to make water the primary drink and set a goal of 64 ounces water daily.    Weight /BMI 12/05/2019 09/25/2019 03/23/2019  WEIGHT 145 lb 150 lb 155 lb  HEIGHT 5\' 4"  5\' 4"  5\' 4"   BMI 24.89 kg/m2 25.75 kg/m2 26.61 kg/m2

## 2019-12-05 NOTE — Progress Notes (Signed)
Virtual Visit via Telephone Note  I connected with Cheyenne Harris on 12/05/19 at  4:00 PM EST by telephone and verified that I am speaking with the correct person using two identifiers.  Location: Patient: HOME Provider: OFFICE   I discussed the limitations, risks, security and privacy concerns of performing an evaluation and management service by telephone and the availability of in person appointments. I also discussed with the patient that there may be a patient responsible charge related to this service. The patient expressed understanding and agreed to proceed.   History of Present Illness: F/U chronic problems, medication review, and ref noill medication when necessary. Review most recent labs and order labs which are due Review preventive health and update with necessary referrals or immunizations as indicated C/o suiniu pressure with thick yellow drainage , post covid infection Temp generally 99, denies chills , has smell and taste as normal    Observations/Objective: BP 134/79   Pulse 82   Ht 5\' 4"  (1.626 m)   Wt 145 lb (65.8 kg)   LMP 10/11/2014 Comment: hysterectomy  BMI 24.89 kg/m   Good communication with no confusion and intact memory. Alert and oriented x 3 No signs of respiratory distress during speech    Assessment and Plan: Essential hypertension Controlled, no change in medication DASH diet and commitment to daily physical activity for a minimum of 30 minutes discussed and encouraged, as a part of hypertension management. The importance of attaining a healthy weight is also discussed.  BP/Weight 12/05/2019 09/25/2019 07/18/2019 03/23/2019 01/23/2019 01/20/2019 [id number]  Systolic BP Q000111Q Q000111Q [id number] 123456 123456 123456 [id number]  Diastolic BP 79 92 84 70 70 85 71  Wt. (Lbs) 145 150 - 155 150 - 150  BMI 24.89 25.75 - 26.61 25.75 - 25.75       Hyperlipidemia LDL goal <100 Hyperlipidemia:Low fat diet discussed and encouraged.   Lipid Panel  Lab Results  Component Value  Date   CHOL 185 12/04/2019   HDL 70 12/04/2019   LDLCALC 99 12/04/2019   TRIG 73 12/04/2019   CHOLHDL 2.6 12/04/2019   Controlled, no change in medication     Overweight (BMI 25.0-29.9)  Patient re-educated about  the importance of commitment to a  minimum of 150 minutes of exercise per week as able.  The importance of healthy food choices with portion control discussed, as well as eating regularly and within a 12 hour window most days. The need to choose "clean , green" food 50 to 75% of the time is discussed, as well as to make water the primary drink and set a goal of 64 ounces water daily.    Weight /BMI 12/05/2019 09/25/2019 03/23/2019  WEIGHT 145 lb 150 lb 155 lb  HEIGHT 5\' 4"  5\' 4"  5\' 4"   BMI 24.89 kg/m2 25.75 kg/m2 26.61 kg/m2      Sinusitis chronic, frontal Z pack prescribed and saline flushes recommended    Follow Up Instructions:    I discussed the assessment and treatment plan with the patient. The patient was provided an opportunity to ask questions and all were answered. The patient agreed with the plan and demonstrated an understanding of the instructions.   The patient was advised to call back or seek an in-person evaluation if the symptoms worsen or if the condition fails to improve as anticipated.  I provided 20 minutes of non-face-to-face time during this encounter.   Tula Nakayama, MD

## 2019-12-05 NOTE — Patient Instructions (Addendum)
Annual physical exam in office with MD in October, call if you need me sooner  Please schedule your mammogram for June 30 or after  Excellent labs and great blood pressure reported  Increase exercise and vegetable intake, this  Improves your health  Fasting lipid, cmp and eGFr, tSH and vit D end September  Think about what you will eat, plan ahead. Choose " clean, green, fresh or frozen" over canned, processed or packaged foods which are more sugary, salty and fatty. 70 to 75% of food eaten should be vegetables and fruit. Three meals at set times with snacks allowed between meals, but they must be fruit or vegetables. Aim to eat over a 12 hour period , example 7 am to 7 pm, and STOP after  your last meal of the day. Drink water,generally about 64 ounces per day, no other drink is as healthy. Fruit juice is best enjoyed in a healthy way, by EATING the fruit.  Thanks for choosing [facility], we consider it a privelige to serve you.

## 2019-12-05 NOTE — Assessment & Plan Note (Signed)
Hyperlipidemia:Low fat diet discussed and encouraged.   Lipid Panel  Lab Results  Component Value Date   CHOL 185 12/04/2019   HDL 70 12/04/2019   LDLCALC 99 12/04/2019   TRIG 73 12/04/2019   CHOLHDL 2.6 12/04/2019   Controlled, no change in medication

## 2019-12-05 NOTE — Assessment & Plan Note (Signed)
Controlled, no change in medication DASH diet and commitment to daily physical activity for a minimum of 30 minutes discussed and encouraged, as a part of hypertension management. The importance of attaining a healthy weight is also discussed.  BP/Weight 12/05/2019 09/25/2019 07/18/2019 03/23/2019 01/23/2019 01/20/2019 [id number]  Systolic BP Q000111Q Q000111Q [id number] 123456 123456 123456 [id number]  Diastolic BP 79 92 84 70 70 85 71  Wt. (Lbs) 145 150 - 155 150 - 150  BMI 24.89 25.75 - 26.61 25.75 - 25.75

## 2019-12-05 NOTE — Assessment & Plan Note (Signed)
Z pack prescribed and saline flushes recommended

## 2020-03-04 ENCOUNTER — Other Ambulatory Visit: Payer: Self-pay | Admitting: Family Medicine

## 2020-05-06 ENCOUNTER — Ambulatory Visit
Admission: EM | Admit: 2020-05-06 | Discharge: 2020-05-06 | Disposition: A | Discharge status: Home / Self Care | Payer: 59

## 2020-05-06 DIAGNOSIS — R509 Fever, unspecified: Secondary | ICD-10-CM

## 2020-05-06 DIAGNOSIS — R6889 Other general symptoms and signs: Secondary | ICD-10-CM | POA: Diagnosis not present

## 2020-05-06 DIAGNOSIS — J0110 Acute frontal sinusitis, unspecified: Secondary | ICD-10-CM

## 2020-05-06 DIAGNOSIS — Z20822 Contact with and (suspected) exposure to covid-19: Secondary | ICD-10-CM

## 2020-05-06 MED ORDER — AMOXICILLIN-POT CLAVULANATE 875-125 MG PO TABS: 1.0000 | ORAL_TABLET | Freq: Two times a day (BID) | ORAL | 0 refills | Status: AC

## 2020-05-06 NOTE — ED Triage Notes (Signed)
Pt presents with headaches, chills, fever and fatigue x 3 days. Mucinex gives relief.

## 2020-05-06 NOTE — Discharge Instructions (Signed)
COVID testing ordered.  It will take between 2-5 days for test results.  Someone will contact you regarding abnormal results.    In the meantime: You should remain isolated in your home for 10 days from symptom onset AND greater than 72 hours after symptoms resolution (absence of fever without the use of fever-reducing medication and improvement in respiratory symptoms), whichever is longer Get plenty of rest and push fluids Use OTC zyrtec for nasal congestion, runny nose, and/or sore throat Use OTC flonase for nasal congestion and runny nose Use medications daily for symptom relief Use OTC medications like ibuprofen or tylenol as needed fever or pain Call or go to the ED if you have any new or worsening symptoms such as fever, cough, shortness of breath, chest tightness, chest pain, turning blue, changes in mental status, etc...  

## 2020-05-06 NOTE — ED Provider Notes (Addendum)
[place]   [id number] 05/06/20 Arrival Time: 8786   CC: COVID symptoms  SUBJECTIVE: History from: patient.  TIERNAN MILLIKIN is a 54 y.o. female who presents with headache, chills, frontal sinus pain/ pressure, subjective, fever, and fatigue x 4-5 days.  Denies sick exposure to COVID, flu or strep.  Had COVID in februrary and received the vaccines as well.  Has tried mucinex with relief.  Symptoms are made worse with laying flat.  Reports previous symptoms in the past with sinus infection.   Denies cough, SOB, wheezing, chest pain, nausea, changes in bowel or bladder habits.     ROS: As per HPI.  All other pertinent ROS negative.     Past Medical History:  Diagnosis Date  . Complication of anesthesia   . Hyperlipidemia   . Hypertension   . PONV (postoperative nausea and vomiting)    Past Surgical History:  Procedure Laterality Date  . ABDOMINAL HYSTERECTOMY    . BILATERAL SALPINGECTOMY Bilateral 03/12/2015   Procedure: BILATERAL SALPINGECTOMY;  Surgeon: Jonnie Kind, MD;  Location: [facility];  Service: Gynecology;  Laterality: Bilateral;  . COLONOSCOPY N/A 04/13/2018   Procedure: COLONOSCOPY;  Surgeon: Rogene Houston, MD;  Location: [facility];  Service: Endoscopy;  Laterality: N/A;  730  . SUPRACERVICAL ABDOMINAL HYSTERECTOMY N/A 03/12/2015   Procedure: HYSTERECTOMY SUPRACERVICAL ABDOMINAL;  Surgeon: Jonnie Kind, MD;  Location: [facility];  Service: Gynecology;  Laterality: N/A;  . WISDOM TOOTH EXTRACTION     Allergies  Allergen Reactions  . Sulfa Antibiotics Hives   No current facility-administered medications on file prior to encounter.   Current Outpatient Medications on File Prior to Encounter  Medication Sig Dispense Refill  . dextromethorphan-guaiFENesin (MUCINEX DM) 30-600 MG 12hr tablet Take 1 tablet by mouth 2 (two) times daily.    Marland Kitchen amLODipine (NORVASC) 2.5 MG tablet Take 1 tablet (2.5 mg total) by mouth daily. 90 tablet 3  . amLODipine (NORVASC) 5  MG tablet TAKE 1 TABLET BY MOUTH EVERY DAY 90 tablet 1  . fexofenadine (ALLEGRA) 180 MG tablet Take 180 mg by mouth daily.    [name] 500 MG CAPS Take 500 mg by mouth daily.     . Multiple Vitamins-Minerals (MULTIVITAMIN WITH MINERALS) tablet Take 1 tablet by mouth daily.    . Probiotic Product (PROBIOTIC PO) Take 1 tablet by mouth daily.    . rosuvastatin (CRESTOR) 5 MG tablet TAKE 1 TABLET BY MOUTH EVERY DAY 90 tablet 1   Social History   Socioeconomic History  . Marital status: Married    Spouse name: Not on file  . Number of children: Not on file  . Years of education: Not on file  . Highest education level: Not on file  Occupational History  . Not on file  Tobacco Use  . Smoking status: Never Smoker  . Smokeless tobacco: Never Used  Substance and Sexual Activity  . Alcohol use: Yes    Alcohol/week: 0.0 standard drinks    Comment: occasionally  . Drug use: No  . Sexual activity: Yes    Birth control/protection: Surgical  Other Topics Concern  . Not on file  Social History Narrative  . Not on file   Social Determinants of Health   Financial Resource Strain:   . Difficulty of Paying Living Expenses:   Food Insecurity:   . Worried About Charity fundraiser in the Last Year:   . [place] in the Last Year:  Transportation Needs:   . Film/video editor (Medical):   Marland Kitchen Lack of Transportation (Non-Medical):   Physical Activity:   . Days of Exercise per Week:   . Minutes of Exercise per Session:   Stress:   . Feeling of Stress :   Social Connections:   . Frequency of Communication with Friends and Family:   . Frequency of Social Gatherings with Friends and Family:   . Attends Religious Services:   . Active Member of Clubs or Organizations:   . Attends Archivist Meetings:   Marland Kitchen Marital Status:   Intimate Partner Violence:   . Fear of Current or Ex-Partner:   . Emotionally Abused:   Marland Kitchen Physically Abused:   . Sexually Abused:    Family  History  Problem Relation Age of Onset  . Hypertension Mother   . Hypertension Sister   . Hypertension Brother   . Diabetes Maternal Grandmother   . Heart disease Maternal Grandmother   . Hypertension Sister     OBJECTIVE:  Vitals:   05/06/20 1000  BP: 124/81  Pulse: 73  Resp: 17  Temp: 98.6 F (37 C)  TempSrc: Oral  SpO2: 97%     General appearance: alert; appears mildly fatigued, but nontoxic; speaking in full sentences and tolerating own secretions HEENT: NCAT; Ears: EACs clear, TMs pearly gray; Eyes: PERRL.  EOM grossly intact. Sinuses: frontal aspect; Nose: nares patent without rhinorrhea, Throat: oropharynx clear, tonsils non erythematous or enlarged, uvula midline  Neck: supple without LAD Lungs: unlabored respirations, symmetrical air entry; cough: absent; no respiratory distress; CTAB Heart: regular rate and rhythm.   Skin: warm and dry Psychological: alert and cooperative; normal mood and affect   ASSESSMENT & PLAN:  1. Fever, unspecified   2. Flu-like symptoms   3. Suspected COVID-19 virus infection   4. Acute non-recurrent frontal sinusitis     Meds ordered this encounter  Medications  . amoxicillin-clavulanate (AUGMENTIN) 875-125 MG tablet    Sig: Take 1 tablet by mouth every 12 (twelve) hours for 10 days.    Dispense:  20 tablet    Refill:  0    Order Specific Question:   Supervising Provider    Answer:   Raylene Everts [[id number]]   COVID testing ordered.  It will take between 2-5 days for test results.  Someone will contact you regarding abnormal results.    In the meantime: You should remain isolated in your home for 10 days from symptom onset AND greater than 72 hours after symptoms resolution (absence of fever without the use of fever-reducing medication and improvement in respiratory symptoms), whichever is longer Get plenty of rest and push fluids Use OTC zyrtec for nasal congestion, runny nose, and/or sore throat Use OTC flonase for nasal  congestion and runny nose Use medications daily for symptom relief Use OTC medications like ibuprofen or tylenol as needed fever or pain Call or go to the ED if you have any new or worsening symptoms such as fever, cough, shortness of breath, chest tightness, chest pain, turning blue, changes in mental status, etc...   augmentin prescribed.  Take as directed and to completion.    Reviewed expectations re: course of current medical issues. Questions answered. Outlined signs and symptoms indicating need for more acute intervention. Patient verbalized understanding. After Visit Summary given.         Lestine Box, PA-C 05/06/20 1024    Stacey Drain [place], [place] 05/06/20 1025

## 2020-05-07 LAB — NOVEL CORONAVIRUS, NAA: SARS-CoV-2, NAA: NOT DETECTED

## 2020-05-07 LAB — SARS-COV-2, NAA 2 DAY TAT

## 2020-05-30 ENCOUNTER — Other Ambulatory Visit: Payer: Self-pay | Admitting: Family Medicine

## 2020-09-12 ENCOUNTER — Other Ambulatory Visit: Payer: Self-pay | Admitting: Family Medicine

## 2020-09-30 ENCOUNTER — Encounter: Payer: 59 | Admitting: Family Medicine

## 2020-10-03 ENCOUNTER — Other Ambulatory Visit: Payer: Self-pay

## 2020-10-03 ENCOUNTER — Encounter: Payer: Self-pay | Admitting: Nurse Practitioner

## 2020-10-03 ENCOUNTER — Telehealth (INDEPENDENT_AMBULATORY_CARE_PROVIDER_SITE_OTHER): Payer: 59 | Admitting: Nurse Practitioner

## 2020-10-03 DIAGNOSIS — J069 Acute upper respiratory infection, unspecified: Secondary | ICD-10-CM | POA: Diagnosis not present

## 2020-10-03 MED ORDER — AZITHROMYCIN 250 MG PO TABS: ORAL_TABLET | ORAL | 0 refills | Status: DC

## 2020-10-03 NOTE — Progress Notes (Signed)
Acute Office Visit  Subjective:    Patient ID: Cheyenne Harris, female    DOB: [birth year], 54 y.o.   MRN: [id number]  Chief Complaint  Patient presents with   Fever    Has been running a high fever since Saturday night 102-103. Chills, no energy, headache. A few days ago she started with some sinus congestion. No cough. Covid test neg. Temp today 99.5. Goes up at night    HPI Patient is in today for sick visit. She has tried taking motrin and tylenol for headache, but that hasn't helped much.  She is taking pseudoephedrine for congestion.  She had a COVID test at urgent care, and it was negative. See is having fatigue, chills, and fever.  Past Medical History:  Diagnosis Date   Complication of anesthesia    Hyperlipidemia    Hypertension    PONV (postoperative nausea and vomiting)     Past Surgical History:  Procedure Laterality Date   ABDOMINAL HYSTERECTOMY     BILATERAL SALPINGECTOMY Bilateral 03/12/2015   Procedure: BILATERAL SALPINGECTOMY;  Surgeon: Jonnie Kind, MD;  Location: [facility];  Service: Gynecology;  Laterality: Bilateral;   COLONOSCOPY N/A 04/13/2018   Procedure: COLONOSCOPY;  Surgeon: Rogene Houston, MD;  [facility]: [facility];  Service: Endoscopy;  Laterality: N/A;  730   SUPRACERVICAL ABDOMINAL HYSTERECTOMY N/A 03/12/2015   Procedure: HYSTERECTOMY SUPRACERVICAL ABDOMINAL;  Surgeon: Jonnie Kind, MD;  Location: [facility];  Service: Gynecology;  Laterality: N/A;   WISDOM TOOTH EXTRACTION      Family History  Problem Relation Age of Onset   Hypertension Mother    Hypertension Sister    Hypertension Brother    Diabetes Maternal Grandmother    Heart disease Maternal Grandmother    Hypertension Sister     Social History   Socioeconomic History   Marital status: Married    Spouse name: Not on file   Number of children: Not on file   Years of education: Not on file   Highest education level: Not on file  Occupational History    Not on file  Tobacco Use   Smoking status: Never Smoker   Smokeless tobacco: Never Used  Substance and Sexual Activity   Alcohol use: Yes    Alcohol/week: 0.0 standard drinks    Comment: occasionally   Drug use: No   Sexual activity: Yes    Birth control/protection: Surgical  Other Topics Concern   Not on file  Social History Narrative   Not on file   Social Determinants of Health   Financial Resource Strain: Not on file  Food Insecurity: Not on file  Transportation Needs: Not on file  Physical Activity: Not on file  Stress: Not on file  Social Connections: Not on file  Intimate Partner Violence: Not on file    Outpatient Medications Prior to Visit  Medication Sig Dispense Refill   amLODipine (NORVASC) 2.5 MG tablet Take 1 tablet (2.5 mg total) by mouth daily. 90 tablet 3   amLODipine (NORVASC) 5 MG tablet TAKE 1 TABLET BY MOUTH EVERY DAY 90 tablet 1   dextromethorphan-guaiFENesin (MUCINEX DM) 30-600 MG 12hr tablet Take 1 tablet by mouth 2 (two) times daily.     fexofenadine (ALLEGRA) 180 MG tablet Take 180 mg by mouth daily.     Krill Oil 500 MG CAPS Take 500 mg by mouth daily.      Multiple Vitamins-Minerals (MULTIVITAMIN WITH MINERALS) tablet Take 1 tablet by mouth daily.  Probiotic Product (PROBIOTIC PO) Take 1 tablet by mouth daily.     rosuvastatin (CRESTOR) 5 MG tablet TAKE 1 TABLET BY MOUTH EVERY DAY 90 tablet 1   No facility-administered medications prior to visit.    Allergies  Allergen Reactions   Sulfa Antibiotics Hives    Review of Systems  Constitutional: Positive for chills, fatigue and fever.  HENT: Positive for congestion and rhinorrhea. Negative for sinus pressure, sinus pain and sore throat.   Respiratory: Negative.   Cardiovascular: Negative.        Objective:    Physical Exam  Temp 99.5 F (37.5 C)    LMP 10/11/2014 Comment: hysterectomy Wt Readings from Last 3 Encounters:  12/05/19 145 lb (65.8 kg)  09/25/19 150 lb  (68 kg)  03/23/19 155 lb (70.3 kg)    Health Maintenance Due  Topic Date Due   Hepatitis C Screening  Never done   INFLUENZA VACCINE  05/05/2020    There are no preventive care reminders to display for this patient.   Lab Results  Component Value Date   TSH 1.86 04/06/2019   Lab Results  Component Value Date   WBC 4.1 12/04/2019   HGB 13.2 12/04/2019   HCT 40.3 12/04/2019   MCV 87.2 12/04/2019   PLT 181 12/04/2019   Lab Results  Component Value Date   NA 140 12/04/2019   K 4.1 12/04/2019   CO2 29 12/04/2019   GLUCOSE 85 12/04/2019   BUN 17 12/04/2019   CREATININE 0.87 12/04/2019   BILITOT 0.6 12/04/2019   ALKPHOS 73 11/19/2016   AST 16 12/04/2019   ALT 12 12/04/2019   PROT 6.8 12/04/2019   ALBUMIN 4.1 11/19/2016   CALCIUM 9.5 12/04/2019   ANIONGAP 5 03/13/2015   Lab Results  Component Value Date   CHOL 185 12/04/2019   Lab Results  Component Value Date   HDL 70 12/04/2019   Lab Results  Component Value Date   LDLCALC 99 12/04/2019   Lab Results  Component Value Date   TRIG 73 12/04/2019   Lab Results  Component Value Date   CHOLHDL 2.6 12/04/2019   No results found for: HGBA1C     Assessment & Plan:   Problem List Items Addressed This Visit      Respiratory   URI (upper respiratory infection)    -negative COVID test at urgent care per pt -she is taking pseudoephedrine as well as tylenol and motrin for symptoms -Rx. azithromycin      Relevant Medications   azithromycin (ZITHROMAX) 250 MG tablet       Meds ordered this encounter  Medications   azithromycin (ZITHROMAX) 250 MG tablet    Sig: Take as directed    Dispense:  6 tablet    Refill:  0    Please dispense as a z-pack   Date:  10/03/2020   Location of Patient: Home Location of Provider: Office Consent was obtain for visit to be over via telehealth. I verified that I am speaking with the correct person using two identifiers.  I connected with  Daiva Eves on  10/03/20 via telephone and verified that I am speaking with the correct person using two identifiers.   I discussed the limitations of evaluation and management by telemedicine. The patient expressed understanding and agreed to proceed.  Time spent: 10 min   Heather Roberts, NP

## 2020-10-03 NOTE — Assessment & Plan Note (Signed)
-  negative COVID test at urgent care per pt -she is taking pseudoephedrine as well as tylenol and motrin for symptoms -Rx. azithromycin

## 2020-10-17 ENCOUNTER — Encounter: Payer: 59 | Admitting: Family Medicine

## 2020-10-18 ENCOUNTER — Encounter: Payer: Self-pay | Admitting: Nurse Practitioner

## 2020-10-18 ENCOUNTER — Ambulatory Visit: Payer: 59 | Admitting: Nurse Practitioner

## 2020-10-18 ENCOUNTER — Other Ambulatory Visit: Payer: Self-pay

## 2020-10-18 VITALS — BP 126/79 | HR 70 | Temp 98.4°F | Resp 20 | Ht 64.0 in | Wt 162.0 lb

## 2020-10-18 DIAGNOSIS — I10 Essential (primary) hypertension: Secondary | ICD-10-CM

## 2020-10-18 DIAGNOSIS — J309 Allergic rhinitis, unspecified: Secondary | ICD-10-CM | POA: Diagnosis not present

## 2020-10-18 DIAGNOSIS — E785 Hyperlipidemia, unspecified: Secondary | ICD-10-CM

## 2020-10-18 DIAGNOSIS — Z0000 Encounter for general adult medical examination without abnormal findings: Secondary | ICD-10-CM | POA: Diagnosis not present

## 2020-10-18 DIAGNOSIS — Z139 Encounter for screening, unspecified: Secondary | ICD-10-CM

## 2020-10-18 MED ORDER — ROSUVASTATIN CALCIUM 5 MG PO TABS: 5.0000 mg | ORAL_TABLET | Freq: Every day | ORAL | 1 refills | Status: DC

## 2020-10-18 NOTE — Progress Notes (Addendum)
Established Patient Office Visit  Subjective:  Patient ID: Cheyenne Harris, female    DOB: [birth year]  Age: 55 y.o. MRN: [id number]  CC:  Chief Complaint  Patient presents with  . Annual Exam    HPI CAMMIE Harris presents for yearly physical exam. No acute complaints.  Past Medical History:  Diagnosis Date  . Complication of anesthesia   . Hyperlipidemia   . Hypertension   . PONV (postoperative nausea and vomiting)     Past Surgical History:  Procedure Laterality Date  . ABDOMINAL HYSTERECTOMY    . BILATERAL SALPINGECTOMY Bilateral 03/12/2015   Procedure: BILATERAL SALPINGECTOMY;  Surgeon: Jonnie Kind, MD;  Location: [facility];  Service: Gynecology;  Laterality: Bilateral;  . COLONOSCOPY N/A 04/13/2018   Procedure: COLONOSCOPY;  Surgeon: Rogene Houston, MD;  Location: [facility];  Service: Endoscopy;  Laterality: N/A;  730  . SUPRACERVICAL ABDOMINAL HYSTERECTOMY N/A 03/12/2015   Procedure: HYSTERECTOMY SUPRACERVICAL ABDOMINAL;  Surgeon: Jonnie Kind, MD;  Location: [facility];  Service: Gynecology;  Laterality: N/A;  . WISDOM TOOTH EXTRACTION      Family History  Problem Relation Age of Onset  . Hypertension Mother   . Hypertension Sister   . Hypertension Brother   . Diabetes Maternal Grandmother   . Heart disease Maternal Grandmother   . Hypertension Sister     Social History   Socioeconomic History  . Marital status: Married    Spouse name: Not on file  . Number of children: Not on file  . Years of education: Not on file  . Highest education level: Not on file  Occupational History  . Not on file  Tobacco Use  . Smoking status: Never Smoker  . Smokeless tobacco: Never Used  Substance and Sexual Activity  . Alcohol use: Yes    Alcohol/week: 0.0 standard drinks    Comment: occasionally  . Drug use: No  . Sexual activity: Yes    Birth control/protection: Surgical  Other Topics Concern  . Not on file  Social History Narrative  . Not on file    Social Determinants of Health   Financial Resource Strain: Not on file  Food Insecurity: Not on file  Transportation Needs: Not on file  Physical Activity: Not on file  Stress: Not on file  Social Connections: Not on file  Intimate Partner Violence: Not on file    Outpatient Medications Prior to Visit  Medication Sig Dispense Refill  . amLODipine (NORVASC) 2.5 MG tablet Take 1 tablet (2.5 mg total) by mouth daily. 90 tablet 3  . amLODipine (NORVASC) 5 MG tablet TAKE 1 TABLET BY MOUTH EVERY DAY 90 tablet 1  . fexofenadine (ALLEGRA) 180 MG tablet Take 180 mg by mouth daily.    [name] 500 MG CAPS Take 500 mg by mouth daily.     . Multiple Vitamins-Minerals (MULTIVITAMIN WITH MINERALS) tablet Take 1 tablet by mouth daily.    . Probiotic Product (PROBIOTIC PO) Take 1 tablet by mouth daily.    . rosuvastatin (CRESTOR) 5 MG tablet TAKE 1 TABLET BY MOUTH EVERY DAY 90 tablet 1  . azithromycin (ZITHROMAX) 250 MG tablet Take as directed (Patient not taking: Reported on 10/18/2020) 6 tablet 0  . dextromethorphan-guaiFENesin (MUCINEX DM) 30-600 MG 12hr tablet Take 1 tablet by mouth 2 (two) times daily. (Patient not taking: Reported on 10/18/2020)     No facility-administered medications prior to visit.    Allergies  Allergen Reactions  . Sulfa Antibiotics  Hives    ROS Review of Systems  Constitutional: Negative.   HENT: Negative.   Eyes: Negative.   Respiratory: Negative.   Cardiovascular: Negative.   Gastrointestinal: Negative.   Endocrine: Negative.   Genitourinary: Negative.   Musculoskeletal: Negative.   Skin: Negative.   Allergic/Immunologic: Negative.   Neurological: Negative.   Hematological: Negative.   Psychiatric/Behavioral: Negative.       Objective:    Physical Exam Constitutional:      Appearance: Normal appearance.  HENT:     Head: Normocephalic and atraumatic.     Right Ear: Tympanic membrane, ear canal and external ear normal.     Left Ear: Tympanic  membrane, ear canal and external ear normal.     Nose: Nose normal.     Mouth/Throat:     Mouth: Mucous membranes are moist.     Pharynx: Oropharynx is clear.  Eyes:     Extraocular Movements: Extraocular movements intact.     Conjunctiva/sclera: Conjunctivae normal.     Pupils: Pupils are equal, round, and reactive to light.  Cardiovascular:     Rate and Rhythm: Normal rate and regular rhythm.     Pulses: Normal pulses.     Heart sounds: Normal heart sounds.  Pulmonary:     Effort: Pulmonary effort is normal.     Breath sounds: Normal breath sounds.  Abdominal:     General: Abdomen is flat. Bowel sounds are normal.     Palpations: Abdomen is soft.  Musculoskeletal:        General: Normal range of motion.     Cervical back: Normal range of motion and neck supple.  Skin:    General: Skin is warm and dry.     Capillary Refill: Capillary refill takes less than 2 seconds.  Neurological:     General: No focal deficit present.     Mental Status: She is alert and oriented to person, place, and time.     Cranial Nerves: No cranial nerve deficit.     Sensory: No sensory deficit.     Motor: No weakness.     Coordination: Coordination normal.     Gait: Gait normal.  Psychiatric:        Mood and Affect: Mood normal.        Behavior: Behavior normal.        Thought Content: Thought content normal.        Judgment: Judgment normal.     BP 126/79   Pulse 70   Temp 98.4 F (36.9 C)   Resp 20   Ht 5' 4"  (1.626 m)   Wt 162 lb (73.5 kg)   LMP 10/11/2014 Comment: hysterectomy  SpO2 96%   BMI 27.81 kg/m  Wt Readings from Last 3 Encounters:  10/18/20 162 lb (73.5 kg)  12/05/19 145 lb (65.8 kg)  09/25/19 150 lb (68 kg)     Health Maintenance Due  Topic Date Due  . Hepatitis C Screening  Never done    There are no preventive care reminders to display for this patient.  Lab Results  Component Value Date   TSH 1.86 04/06/2019   Lab Results  Component Value Date   WBC 4.1  12/04/2019   HGB 13.2 12/04/2019   HCT 40.3 12/04/2019   MCV 87.2 12/04/2019   PLT 181 12/04/2019   Lab Results  Component Value Date   NA 140 12/04/2019   K 4.1 12/04/2019   CO2 29 12/04/2019   GLUCOSE 85 12/04/2019  BUN 17 12/04/2019   CREATININE 0.87 12/04/2019   BILITOT 0.6 12/04/2019   ALKPHOS 73 11/19/2016   AST 16 12/04/2019   ALT 12 12/04/2019   PROT 6.8 12/04/2019   ALBUMIN 4.1 11/19/2016   CALCIUM 9.5 12/04/2019   ANIONGAP 5 03/13/2015   Lab Results  Component Value Date   CHOL 185 12/04/2019   Lab Results  Component Value Date   HDL 70 12/04/2019   Lab Results  Component Value Date   LDLCALC 99 12/04/2019   Lab Results  Component Value Date   TRIG 73 12/04/2019   Lab Results  Component Value Date   CHOLHDL 2.6 12/04/2019   No results found for: HGBA1C    Assessment & Plan:   Problem List Items Addressed This Visit      Cardiovascular and Mediastinum   Essential hypertension    -well controlled -no changes to meds today      Relevant Medications   rosuvastatin (CRESTOR) 5 MG tablet     Respiratory   Allergic rhinitis    -well controlled  -takes allegra        Other   Hyperlipidemia LDL goal <100    -no labs to review today -ordered lipid panel along with other labs      Relevant Medications   rosuvastatin (CRESTOR) 5 MG tablet    Other Visit Diagnoses    Preventative health care    -  Primary   Relevant Orders   CBC with Differential/Platelet   CMP14+EGFR   Lipid Panel With LDL/HDL Ratio   Screening due       Relevant Orders   HCV Ab w/Rflx to Verification      Meds ordered this encounter  Medications  . rosuvastatin (CRESTOR) 5 MG tablet    Sig: Take 1 tablet (5 mg total) by mouth daily.    Dispense:  90 tablet    Refill:  1    Follow-up: Return in about 1 year (around 10/18/2021) for Comprehensive Physical Exam.    Noreene Larsson, NP

## 2020-10-18 NOTE — Assessment & Plan Note (Signed)
-  well controlled  -takes Human resources officer

## 2020-10-18 NOTE — Patient Instructions (Signed)
You were seen today for a physical exam. I refilled your cholesterol medication.  We will get labs today and we will call you with your lab results. We'll see you back in a year!

## 2020-10-18 NOTE — Assessment & Plan Note (Signed)
-  well controlled -no changes to meds today

## 2020-10-18 NOTE — Assessment & Plan Note (Signed)
-  no labs to review today -ordered lipid panel along with other labs

## 2020-10-19 LAB — CBC WITH DIFFERENTIAL/PLATELET
Basophils Absolute: 0 10*3/uL (ref 0.0–0.2)
Basos: 1 %
EOS (ABSOLUTE): 0 10*3/uL (ref 0.0–0.4)
Eos: 1 %
Hematocrit: 39.1 % (ref 34.0–46.6)
Hemoglobin: 13.5 g/dL (ref 11.1–15.9)
Immature Grans (Abs): 0 10*3/uL (ref 0.0–0.1)
Immature Granulocytes: 0 %
Lymphocytes Absolute: 1.7 10*3/uL (ref 0.7–3.1)
Lymphs: 48 %
MCH: 28.2 pg (ref 26.6–33.0)
MCHC: 34.5 g/dL (ref 31.5–35.7)
MCV: 82 fL (ref 79–97)
Monocytes Absolute: 0.3 10*3/uL (ref 0.1–0.9)
Monocytes: 7 %
Neutrophils Absolute: 1.6 10*3/uL (ref 1.4–7.0)
Neutrophils: 43 %
Platelets: 223 10*3/uL (ref 150–450)
RBC: 4.78 x10E6/uL (ref 3.77–5.28)
RDW: 11.8 % (ref 11.7–15.4)
WBC: 3.7 10*3/uL (ref 3.4–10.8)

## 2020-10-19 LAB — CMP14+EGFR
ALT: 12 IU/L (ref 0–32)
AST: 17 IU/L (ref 0–40)
Albumin/Globulin Ratio: 1.5 (ref 1.2–2.2)
Albumin: 4.2 g/dL (ref 3.8–4.9)
Alkaline Phosphatase: 107 IU/L (ref 44–121)
BUN/Creatinine Ratio: 13 (ref 9–23)
BUN: 12 mg/dL (ref 6–24)
Bilirubin Total: 0.5 mg/dL (ref 0.0–1.2)
CO2: 24 mmol/L (ref 20–29)
Calcium: 9.8 mg/dL (ref 8.7–10.2)
Chloride: 104 mmol/L (ref 96–106)
Creatinine, Ser: 0.91 mg/dL (ref 0.57–1.00)
GFR calc Af Amer: 83 mL/min/{1.73_m2} (ref >59)
GFR calc non Af Amer: 72 mL/min/{1.73_m2} (ref >59)
Globulin, Total: 2.8 g/dL (ref 1.5–4.5)
Glucose: 84 mg/dL (ref 65–99)
Potassium: 4.4 mmol/L (ref 3.5–5.2)
Sodium: 141 mmol/L (ref 134–144)
Total Protein: 7 g/dL (ref 6.0–8.5)

## 2020-10-19 LAB — LIPID PANEL WITH LDL/HDL RATIO
Cholesterol, Total: 171 mg/dL (ref 100–199)
HDL: 72 mg/dL (ref >39)
LDL Chol Calc (NIH): 90 mg/dL (ref 0–99)
LDL/HDL Ratio: 1.3 ratio (ref 0.0–3.2)
Triglycerides: 41 mg/dL (ref 0–149)
VLDL Cholesterol Cal: 9 mg/dL (ref 5–40)

## 2020-10-19 LAB — HCV INTERPRETATION

## 2020-10-19 LAB — HCV AB W/RFLX TO VERIFICATION: HCV Ab: <0.1 s/co ratio (ref 0.0–0.9)

## 2020-10-21 NOTE — Progress Notes (Signed)
Labs look great.

## 2020-11-22 ENCOUNTER — Other Ambulatory Visit: Payer: Self-pay | Admitting: Family Medicine

## 2021-01-21 ENCOUNTER — Encounter: Payer: Self-pay | Admitting: Family Medicine

## 2021-01-22 ENCOUNTER — Encounter: Payer: Self-pay | Admitting: Internal Medicine

## 2021-01-22 ENCOUNTER — Other Ambulatory Visit: Payer: Self-pay

## 2021-01-22 ENCOUNTER — Telehealth (INDEPENDENT_AMBULATORY_CARE_PROVIDER_SITE_OTHER): Payer: 59 | Admitting: Internal Medicine

## 2021-01-22 VITALS — Temp 99.8°F

## 2021-01-22 DIAGNOSIS — U071 COVID-19: Secondary | ICD-10-CM | POA: Diagnosis not present

## 2021-01-22 MED ORDER — MOLNUPIRAVIR EUA 200MG CAPSULE: 4.0000 | ORAL_CAPSULE | Freq: Two times a day (BID) | ORAL | 0 refills | Status: AC

## 2021-01-22 NOTE — Progress Notes (Signed)
Virtual Visit via Telephone Note   This visit type was conducted due to national recommendations for restrictions regarding the COVID-19 Pandemic (e.g. social distancing) in an effort to limit this patient's exposure and mitigate transmission in our community.  Due to her co-morbid illnesses, this patient is at least at moderate risk for complications without adequate follow up.  This format is felt to be most appropriate for this patient at this time.  The patient did not have access to video technology/had technical difficulties with video requiring transitioning to audio format only (telephone).  All issues noted in this document were discussed and addressed.  No physical exam could be performed with this format.  Evaluation Performed:  Follow-up visit  Date:  01/22/2021   ID:  Cheyenne Harris, Cheyenne Harris 03/12/1966, MRN [id number]  Patient Location: Home Provider Location: Office/Clinic  Participants: Patient Location of Patient: Home Location of Provider: Telehealth Consent was obtain for visit to be over via telehealth. I verified that I am speaking with the correct person using two identifiers.  PCP:  Fayrene Helper, MD   Chief Complaint: Nasal congestion and cough  History of Present Illness:    Cheyenne Harris is a 55 y.o. female who has a televisit for c/o nasal congestion and cough for last 2 days. She had positive COVID test yesterday. She has tried Mucinex and Tylenol for symptomatic relief with some help. She had episodes of fever on Monday. She denies any dyspnea or wheezing currently.  The patient does not have symptoms concerning for COVID-19 infection (fever, chills, cough, or new shortness of breath).   Past Medical, Surgical, Social History, Allergies, and Medications have been Reviewed.  Past Medical History:  Diagnosis Date  . Complication of anesthesia   . Hyperlipidemia   . Hypertension   . PONV (postoperative nausea and vomiting)    Past Surgical  History:  Procedure Laterality Date  . ABDOMINAL HYSTERECTOMY    . BILATERAL SALPINGECTOMY Bilateral 03/12/2015   Procedure: BILATERAL SALPINGECTOMY;  Surgeon: Jonnie Kind, MD;  Location: [facility];  Service: Gynecology;  Laterality: Bilateral;  . COLONOSCOPY N/A 04/13/2018   Procedure: COLONOSCOPY;  Surgeon: Rogene Houston, MD;  Location: [facility];  Service: Endoscopy;  Laterality: N/A;  730  . SUPRACERVICAL ABDOMINAL HYSTERECTOMY N/A 03/12/2015   Procedure: HYSTERECTOMY SUPRACERVICAL ABDOMINAL;  Surgeon: Jonnie Kind, MD;  Location: [facility];  Service: Gynecology;  Laterality: N/A;  . WISDOM TOOTH EXTRACTION       Current Meds  Medication Sig  . amLODipine (NORVASC) 2.5 MG tablet Take 1 tablet (2.5 mg total) by mouth daily.  Marland Kitchen amLODipine (NORVASC) 5 MG tablet TAKE 1 TABLET BY MOUTH EVERY DAY  . dextromethorphan-guaiFENesin (MUCINEX DM) 30-600 MG 12hr tablet Take 1 tablet by mouth 2 (two) times daily.  . fexofenadine (ALLEGRA) 180 MG tablet Take 180 mg by mouth daily.  [name] 500 MG CAPS Take 500 mg by mouth daily.   Gus Height EUA 200 mg CAPS Take 4 capsules (800 mg total) by mouth 2 (two) times daily for 5 days.  . Multiple Vitamins-Minerals (MULTIVITAMIN WITH MINERALS) tablet Take 1 tablet by mouth daily.  . Probiotic Product (PROBIOTIC PO) Take 1 tablet by mouth daily.  . rosuvastatin (CRESTOR) 5 MG tablet Take 1 tablet (5 mg total) by mouth daily.     Allergies:   Sulfa antibiotics   ROS:   Please see the history of present illness.     All  other systems reviewed and are negative.   Labs/Other Tests and Data Reviewed:    Recent Labs: 10/18/2020: ALT 12; BUN 12; Creatinine, Ser 0.91; Hemoglobin 13.5; Platelets 223; Potassium 4.4; Sodium 141   Recent Lipid Panel Lab Results  Component Value Date/Time   CHOL 171 10/18/2020 08:54 AM   TRIG 41 10/18/2020 08:54 AM   HDL 72 10/18/2020 08:54 AM   CHOLHDL 2.6 12/04/2019 07:17 AM   LDLCALC 90 10/18/2020 08:54 AM    LDLCALC 99 12/04/2019 07:17 AM    Wt Readings from Last 3 Encounters:  10/18/20 162 lb (73.5 kg)  12/05/19 145 lb (65.8 kg)  09/25/19 150 lb (68 kg)      ASSESSMENT & PLAN:    COVID-19 infection Prescribed Molnupiravir Continue Mucinex PRN Advised to contact/get medical attention if she has dyspnea, wheezing or chest pain with palpitations Self-quarantine for 5 more days or untile 24-hour afebrile period  Time:   Today, I have spent 13 minutes reviewing the chart, including problem list, medications, and with the patient with telehealth technology discussing the above problems.   Medication Adjustments/Labs and Tests Ordered: Current medicines are reviewed at length with the patient today.  Concerns regarding medicines are outlined above.   Tests Ordered: No orders of the defined types were placed in this encounter.   Medication Changes: Meds ordered this encounter  Medications  . molnupiravir EUA 200 mg CAPS    Sig: Take 4 capsules (800 mg total) by mouth 2 (two) times daily for 5 days.    Dispense:  40 capsule    Refill:  0     Note: This dictation was prepared with Dragon dictation along with smaller phrase technology. Similar sounding words can be transcribed inadequately or may not be corrected upon review. Any transcriptional errors that result from this process are unintentional.      Disposition:  Follow up  Signed, Lindell Spar, MD  01/22/2021 12:03 PM     [place]

## 2021-01-22 NOTE — Patient Instructions (Signed)
Please start taking Molnupiravir as prescribed.  Okay to take Mucinex for symptomatic relief.  Please self-quarantine for at least 7 days from symptoms onset.  Please get immediate medical attention if you have shortness of breath, wheezing or chest pain with palpitations.

## 2021-02-06 ENCOUNTER — Other Ambulatory Visit: Payer: Self-pay | Admitting: Family Medicine

## 2021-03-13 ENCOUNTER — Other Ambulatory Visit: Payer: Self-pay | Admitting: Nurse Practitioner

## 2021-06-11 ENCOUNTER — Other Ambulatory Visit: Payer: Self-pay | Admitting: Family Medicine

## 2021-10-20 ENCOUNTER — Encounter: Payer: 59 | Admitting: Family Medicine

## 2021-11-10 ENCOUNTER — Other Ambulatory Visit: Payer: Self-pay

## 2021-11-10 ENCOUNTER — Telehealth: Payer: Self-pay | Admitting: Family Medicine

## 2021-11-10 MED ORDER — ROSUVASTATIN CALCIUM 5 MG PO TABS: 5.0000 mg | ORAL_TABLET | Freq: Every day | ORAL | 1 refills | Status: DC

## 2021-11-10 NOTE — Telephone Encounter (Signed)
Pt called in for refill on    rosuvastatin (CRESTOR) 5 MG tablet

## 2021-11-10 NOTE — Telephone Encounter (Signed)
Refill sent.

## 2021-11-13 ENCOUNTER — Encounter: Payer: 59 | Admitting: Family Medicine

## 2021-11-18 ENCOUNTER — Other Ambulatory Visit: Payer: Self-pay

## 2021-11-18 ENCOUNTER — Ambulatory Visit (INDEPENDENT_AMBULATORY_CARE_PROVIDER_SITE_OTHER): Payer: 59 | Admitting: Family Medicine

## 2021-11-18 ENCOUNTER — Encounter: Payer: Self-pay | Admitting: Family Medicine

## 2021-11-18 VITALS — BP 120/72 | HR 79 | Resp 16 | Ht 64.0 in | Wt 161.0 lb

## 2021-11-18 DIAGNOSIS — Z01419 Encounter for gynecological examination (general) (routine) without abnormal findings: Secondary | ICD-10-CM | POA: Diagnosis not present

## 2021-11-18 DIAGNOSIS — E559 Vitamin D deficiency, unspecified: Secondary | ICD-10-CM | POA: Diagnosis not present

## 2021-11-18 DIAGNOSIS — Z1231 Encounter for screening mammogram for malignant neoplasm of breast: Secondary | ICD-10-CM | POA: Diagnosis not present

## 2021-11-18 DIAGNOSIS — I10 Essential (primary) hypertension: Secondary | ICD-10-CM

## 2021-11-18 DIAGNOSIS — Z0000 Encounter for general adult medical examination without abnormal findings: Secondary | ICD-10-CM | POA: Diagnosis not present

## 2021-11-18 DIAGNOSIS — Z23 Encounter for immunization: Secondary | ICD-10-CM | POA: Diagnosis not present

## 2021-11-18 DIAGNOSIS — E785 Hyperlipidemia, unspecified: Secondary | ICD-10-CM

## 2021-11-18 MED ORDER — POLYETHYLENE GLYCOL 3350 17 GM/SCOOP PO POWD: 17.0000 g | Freq: Two times a day (BID) | ORAL | 5 refills | Status: AC

## 2021-11-18 NOTE — Progress Notes (Signed)
° ° °  Cheyenne Harris     MRN: [id number]      DOB: 07-23-1966  HPI: Patient is in for annual physical exam. No other health concerns are expressed or addressed at the visit. Recent labs,  are reviewed. Immunization is reviewed , and  updated if needed.   PE: BP 120/72    Pulse 79    Resp 16    Ht 5\' 4"  (1.626 m)    Wt 161 lb (73 kg)    LMP 10/11/2014 Comment: hysterectomy   SpO2 96%    BMI 27.64 kg/m   Pleasant  female, alert and oriented x 3, in no cardio-pulmonary distress. Afebrile. HEENT No facial trauma or asymetry. Sinuses non tender.  Extra occullar muscles intact.. External ears normal, . Neck: supple, no adenopathy,JVD or thyromegaly.No bruits.  Chest: Clear to ascultation bilaterally.No crackles or wheezes. Non tender to palpation   Cardiovascular system; Heart sounds normal,  S1 and  S2 ,no S3.  No murmur, or thrill. Apical beat not displaced Peripheral pulses normal.  Abdomen: Soft, non tender, no organomegaly or masses. No bruits. Bowel sounds normal. No guarding, tenderness or rebound.    Musculoskeletal exam: Full ROM of spine, hips , shoulders and knees. No deformity ,swelling or crepitus noted. No muscle wasting or atrophy.   Neurologic: Cranial nerves 2 to 12 intact. Power, tone ,sensation and reflexes normal throughout. No disturbance in gait. No tremor.  Skin: Intact, no ulceration, erythema , scaling or rash noted. Pigmentation normal throughout  Psych; Normal mood and affect. Judgement and concentration normal   Assessment & Plan:  Annual physical exam Annual exam as documented. Counseling done  re healthy lifestyle involving commitment to 150 minutes exercise per week, heart healthy diet, and attaining healthy weight.The importance of adequate sleep also discussed. Regular seat belt use and home safety, is also discussed. Changes in health habits are decided on by the patient with goals and time frames  set for achieving  them. Immunization and cancer screening needs are specifically addressed at this visit.

## 2021-11-18 NOTE — Patient Instructions (Addendum)
Follow-up in 6 months call if you need me sooner.    Shingrix No. 1 at visit today.  Nurse visiting 2 to 4 months for shingrix  #2.  Fasting labs as soon as possible.  CBC lipid CMP and EGFR TSH and vitamin D.  Please work on weight loss goal of 10 pound weight loss in the next 6 months.  Please schedule mammogram at checkout.  You are referred to gynecology for pelvic and Pap which is overdue.  Please arrange Cologuard test to be sent at age 56 therefore  May or after  It is important that you exercise regularly at least 30 minutes 5 times a week. If you develop chest pain, have severe difficulty breathing, or feel very tired, stop exercising immediately and seek medical attention  Thanks for choosing [facility], we consider it a privelige to serve you.

## 2021-11-18 NOTE — Assessment & Plan Note (Signed)

## 2021-11-20 ENCOUNTER — Ambulatory Visit (HOSPITAL_COMMUNITY)
Admission: RE | Admit: 2021-11-20 | Discharge: 2021-11-20 | Disposition: A | Discharge status: Home / Self Care | Payer: 59 | Source: Ambulatory Visit | Attending: Family Medicine | Admitting: Family Medicine

## 2021-11-20 ENCOUNTER — Other Ambulatory Visit: Payer: Self-pay

## 2021-11-20 DIAGNOSIS — Z1231 Encounter for screening mammogram for malignant neoplasm of breast: Secondary | ICD-10-CM | POA: Insufficient documentation

## 2021-11-21 LAB — CBC
Hematocrit: 41.9 % (ref 34.0–46.6)
Hemoglobin: 13.8 g/dL (ref 11.1–15.9)
MCH: 28.1 pg (ref 26.6–33.0)
MCHC: 32.9 g/dL (ref 31.5–35.7)
MCV: 85 fL (ref 79–97)
Platelets: 227 10*3/uL (ref 150–450)
RBC: 4.91 x10E6/uL (ref 3.77–5.28)
RDW: 13 % (ref 11.7–15.4)
WBC: 6 10*3/uL (ref 3.4–10.8)

## 2021-11-21 LAB — LIPID PANEL
Chol/HDL Ratio: 2.7 ratio (ref 0.0–4.4)
Cholesterol, Total: 185 mg/dL (ref 100–199)
HDL: 68 mg/dL (ref >39)
LDL Chol Calc (NIH): 105 mg/dL — ABNORMAL HIGH (ref 0–99)
Triglycerides: 65 mg/dL (ref 0–149)
VLDL Cholesterol Cal: 12 mg/dL (ref 5–40)

## 2021-11-21 LAB — CMP14+EGFR
ALT: 15 IU/L (ref 0–32)
AST: 20 IU/L (ref 0–40)
Albumin/Globulin Ratio: 1.6 (ref 1.2–2.2)
Albumin: 4.4 g/dL (ref 3.8–4.9)
Alkaline Phosphatase: 124 IU/L — ABNORMAL HIGH (ref 44–121)
BUN/Creatinine Ratio: 15 (ref 9–23)
BUN: 13 mg/dL (ref 6–24)
Bilirubin Total: 0.5 mg/dL (ref 0.0–1.2)
CO2: 27 mmol/L (ref 20–29)
Calcium: 10.1 mg/dL (ref 8.7–10.2)
Chloride: 103 mmol/L (ref 96–106)
Creatinine, Ser: 0.85 mg/dL (ref 0.57–1.00)
Globulin, Total: 2.7 g/dL (ref 1.5–4.5)
Glucose: 85 mg/dL (ref 70–99)
Potassium: 4.4 mmol/L (ref 3.5–5.2)
Sodium: 142 mmol/L (ref 134–144)
Total Protein: 7.1 g/dL (ref 6.0–8.5)
eGFR: 81 mL/min/{1.73_m2} (ref >59)

## 2021-11-21 LAB — TSH: TSH: 1.65 u[IU]/mL (ref 0.450–4.500)

## 2021-11-21 LAB — VITAMIN D 25 HYDROXY (VIT D DEFICIENCY, FRACTURES): Vit D, 25-Hydroxy: 61 ng/mL (ref 30.0–100.0)

## 2021-12-06 ENCOUNTER — Other Ambulatory Visit: Payer: Self-pay | Admitting: Family Medicine

## 2021-12-16 ENCOUNTER — Other Ambulatory Visit: Payer: Self-pay

## 2021-12-16 ENCOUNTER — Ambulatory Visit: Payer: 59 | Admitting: Obstetrics & Gynecology

## 2021-12-16 ENCOUNTER — Encounter: Payer: Self-pay | Admitting: Obstetrics & Gynecology

## 2021-12-16 ENCOUNTER — Other Ambulatory Visit (HOSPITAL_COMMUNITY)
Admission: RE | Admit: 2021-12-16 | Discharge: 2021-12-16 | Disposition: A | Discharge status: Home / Self Care | Payer: 59 | Source: Ambulatory Visit | Attending: Obstetrics & Gynecology | Admitting: Obstetrics & Gynecology

## 2021-12-16 VITALS — BP 125/70 | HR 75 | Ht 64.0 in | Wt 160.0 lb

## 2021-12-16 DIAGNOSIS — Z124 Encounter for screening for malignant neoplasm of cervix: Secondary | ICD-10-CM | POA: Diagnosis present

## 2021-12-16 DIAGNOSIS — Z01419 Encounter for gynecological examination (general) (routine) without abnormal findings: Secondary | ICD-10-CM

## 2021-12-16 NOTE — Progress Notes (Signed)
? ? ? ? ?Chief Complaint  ?Patient presents with  ? Gynecologic Exam  ?  Pap only  ? ? ? ? ?56 y.o. G2P2 Patient's last menstrual period was 10/11/2014. The current method of family planning is status post hysterectomy. ? ?Outpatient Encounter Medications as of 12/16/2021  ?Medication Sig  ? amLODipine (NORVASC) 2.5 MG tablet TAKE 1 TABLET BY MOUTH EVERY DAY  ? amLODipine (NORVASC) 5 MG tablet TAKE 1 TABLET BY MOUTH EVERY DAY  ? Multiple Vitamins-Minerals (MULTIVITAMIN WITH MINERALS) tablet Take 1 tablet by mouth daily.  ? polyethylene glycol powder (GLYCOLAX/MIRALAX) 17 GM/SCOOP powder Take 17 g by mouth in the morning and at bedtime.  ? rosuvastatin (CRESTOR) 5 MG tablet Take 1 tablet (5 mg total) by mouth daily.  ? ?No facility-administered encounter medications on file as of 12/16/2021.  ? ? ?Subjective ?Pt without any gyn complaints ?S/p hysterectomy for fibroids and bleeding ?Past Medical History:  ?Diagnosis Date  ? Complication of anesthesia   ? Hyperlipidemia   ? Hypertension   ? PONV (postoperative nausea and vomiting)   ? ? ?Past Surgical History:  ?Procedure Laterality Date  ? ABDOMINAL HYSTERECTOMY    ? BILATERAL SALPINGECTOMY Bilateral 03/12/2015  ? Procedure: BILATERAL SALPINGECTOMY;  Surgeon: Jonnie Kind, MD;  Location: [facility];  Service: Gynecology;  Laterality: Bilateral;  ? COLONOSCOPY N/A 04/13/2018  ? Procedure: COLONOSCOPY;  Surgeon: Rogene Houston, MD;  Location: [facility];  Service: Endoscopy;  Laterality: N/A;  730  ? SUPRACERVICAL ABDOMINAL HYSTERECTOMY N/A 03/12/2015  ? Procedure: HYSTERECTOMY SUPRACERVICAL ABDOMINAL;  Surgeon: Jonnie Kind, MD;  Location: [facility];  Service: Gynecology;  Laterality: N/A;  ? WISDOM TOOTH EXTRACTION    ? ? ?OB History   ? ? Gravida  ?2  ? Para  ?2  ? Term  ?   ? Preterm  ?   ? AB  ?   ? Living  ?2  ?  ? ? SAB  ?   ? IAB  ?   ? Ectopic  ?   ? Multiple  ?   ? Live Births  ?   ?   ?  ?  ? ? ?Allergies  ?Allergen Reactions  ? Sulfa Antibiotics Hives   ? ? ?Social History  ? ?Socioeconomic History  ? Marital status: Married  ?  Spouse name: Not on file  ? Number of children: Not on file  ? Years of education: Not on file  ? Highest education level: Not on file  ?Occupational History  ? Not on file  ?Tobacco Use  ? Smoking status: Never  ? Smokeless tobacco: Never  ?Substance and Sexual Activity  ? Alcohol use: Yes  ?  Alcohol/week: 0.0 standard drinks  ?  Comment: occasionally  ? Drug use: No  ? Sexual activity: Yes  ?  Birth control/protection: Surgical  ?Other Topics Concern  ? Not on file  ?Social History Narrative  ? Not on file  ? ?Social Determinants of Health  ? ?Financial Resource Strain: Low Risk   ? Difficulty of Paying Living Expenses: Not hard at all  ?Food Insecurity: No Food Insecurity  ? Worried About Charity fundraiser in the Last Year: Never true  ? Ran Out of Food in the Last Year: Never true  ?Transportation Needs: No Transportation Needs  ? Lack of Transportation (Medical): No  ? Lack of Transportation (Non-Medical): No  ?Physical Activity: Sufficiently Active  ? Days of Exercise per Week: 5 days  ?  Minutes of Exercise per Session: 40 min  ?Stress: No Stress Concern Present  ? Feeling of Stress : Only a little  ?Social Connections: Socially Integrated  ? Frequency of Communication with Friends and Family: More than three times a week  ? Frequency of Social Gatherings with Friends and Family: Once a week  ? Attends Religious Services: More than 4 times per year  ? Active Member of Clubs or Organizations: Yes  ? Attends Archivist Meetings: 1 to 4 times per year  ? Marital Status: Married  ? ? ?Family History  ?Problem Relation Age of Onset  ? Hypertension Mother   ? Hypertension Sister   ? Hypertension Brother   ? Diabetes Maternal Grandmother   ? Heart disease Maternal Grandmother   ? Hypertension Sister   ? ? ?Medications:       ?Current Outpatient Medications:  ?  amLODipine (NORVASC) 2.5 MG tablet, TAKE 1 TABLET BY MOUTH EVERY  DAY, Disp: 90 tablet, Rfl: 3 ?  amLODipine (NORVASC) 5 MG tablet, TAKE 1 TABLET BY MOUTH EVERY DAY, Disp: 90 tablet, Rfl: 1 ?  Multiple Vitamins-Minerals (MULTIVITAMIN WITH MINERALS) tablet, Take 1 tablet by mouth daily., Disp: , Rfl:  ?  polyethylene glycol powder (GLYCOLAX/MIRALAX) 17 GM/SCOOP powder, Take 17 g by mouth in the morning and at bedtime., Disp: 510 g, Rfl: 5 ?  rosuvastatin (CRESTOR) 5 MG tablet, Take 1 tablet (5 mg total) by mouth daily., Disp: 90 tablet, Rfl: 1 ? ?Objective ?Blood pressure 125/70, pulse 75, height '5\' 4"'$  (1.626 m), weight 160 lb (72.6 kg), last menstrual period 10/11/2014. ? ?General WDWN female NAD ?Vulva:  normal appearing vulva with no masses, tenderness or lesions ?Vagina:  normal mucosa, no discharge ?Cervix:  Normal no lesions ?Uterus:  absent ?Adnexa: ovaries:present,  normal adnexa in size, nontender and no masses ? ? ?Pertinent ROS ?No burning with urination, frequency or urgency ?No nausea, vomiting or diarrhea ?Nor fever chills or other constitutional symptoms ? ? ?Labs or studies ? ? ? ? ?Impression ?Diagnoses this Encounter:: ?  ICD-10-CM   ?1. Well woman exam with routine gynecological exam  Z01.419   ?  ?2. Routine cervical smear  Z12.4 Cytology - PAP( [place])  ?  ? ? ?Established relevant diagnosis(es): ? ? ?Plan/Recommendations: ?No orders of the defined types were placed in this encounter. ? ? ?Labs or Scans Ordered: ?No orders of the defined types were placed in this encounter. ? ? ?Management:: ?Repeat HPV based cytology 5 years if normal ? ?Follow up ?No follow-ups on file. ? ? ? ? ? All questions were answered. ? ? ?

## 2021-12-19 LAB — CYTOLOGY - PAP
Comment: NEGATIVE
Diagnosis: NEGATIVE
High risk HPV: NEGATIVE

## 2022-01-10 ENCOUNTER — Other Ambulatory Visit: Payer: Self-pay | Admitting: Family Medicine

## 2022-02-20 ENCOUNTER — Ambulatory Visit (INDEPENDENT_AMBULATORY_CARE_PROVIDER_SITE_OTHER): Payer: 59

## 2022-02-20 DIAGNOSIS — Z23 Encounter for immunization: Secondary | ICD-10-CM

## 2022-05-05 ENCOUNTER — Other Ambulatory Visit: Payer: Self-pay | Admitting: Family Medicine

## 2022-05-19 ENCOUNTER — Ambulatory Visit (INDEPENDENT_AMBULATORY_CARE_PROVIDER_SITE_OTHER): Payer: 59 | Admitting: Family Medicine

## 2022-05-19 ENCOUNTER — Encounter: Payer: Self-pay | Admitting: Family Medicine

## 2022-05-19 VITALS — BP 124/70 | HR 67 | Resp 16 | Ht 64.0 in | Wt 160.1 lb

## 2022-05-19 DIAGNOSIS — Z1211 Encounter for screening for malignant neoplasm of colon: Secondary | ICD-10-CM

## 2022-05-19 DIAGNOSIS — E785 Hyperlipidemia, unspecified: Secondary | ICD-10-CM | POA: Diagnosis not present

## 2022-05-19 DIAGNOSIS — E663 Overweight: Secondary | ICD-10-CM | POA: Diagnosis not present

## 2022-05-19 DIAGNOSIS — I10 Essential (primary) hypertension: Secondary | ICD-10-CM | POA: Diagnosis not present

## 2022-05-19 NOTE — Assessment & Plan Note (Signed)
Controlled, no change in medication DASH diet and commitment to daily physical activity for a minimum of 30 minutes discussed and encouraged, as a part of hypertension management. The importance of attaining a healthy weight is also discussed.     05/19/2022    4:03 PM 05/19/2022    3:34 PM 12/16/2021    3:20 PM 11/18/2021    3:24 PM 10/18/2020    8:12 AM 05/06/2020   10:00 AM 12/05/2019    4:12 PM  BP/Weight  Systolic BP 786 754 492 010 071 219 758  Diastolic BP 70 76 70 72 79 81 79  Wt. (Lbs)  160.12 160 161 162  145  BMI  27.48 kg/m2 27.46 kg/m2 27.64 kg/m2 27.81 kg/m2  24.89 kg/m2

## 2022-05-19 NOTE — Patient Instructions (Signed)
F/U in 6 months, call if u you need me sooner  Pls call and come for flu vaccine in September, or get at your pharmacy and let us know  Fasting lipid, cmp and eGFr in next 1 week, as soon as possible  Limit calories to 1200 per day, use  My fitness Pal" app ( nurse  please provide samplee diet sheet)  Weight loss goal of 6 pounds  Continue healthy exercise habits  Thanks for choosing [facility], we consider it a privelige to serve you.  Nurse please arrange cologuard test  Thanks for choosing [facility], we consider it a privelige to serve you.

## 2022-05-19 NOTE — Assessment & Plan Note (Signed)
  Patient re-educated about  the importance of commitment to a  minimum of 150 minutes of exercise per week as able.  The importance of healthy food choices with portion control discussed, as well as eating regularly and within a 12 hour window most days. The need to choose "clean , green" food 50 to 75% of the time is discussed, as well as to make water the primary drink and set a goal of 64 ounces water daily.       05/19/2022    3:34 PM 12/16/2021    3:20 PM 11/18/2021    3:24 PM  Weight /BMI  Weight 160 lb 1.9 oz 160 lb 161 lb  Height '5\' 4"'$  (1.626 m) '5\' 4"'$  (1.626 m) '5\' 4"'$  (1.626 m)  BMI 27.48 kg/m2 27.46 kg/m2 27.64 kg/m2

## 2022-05-19 NOTE — Assessment & Plan Note (Signed)
Hyperlipidemia:Low fat diet discussed and encouraged.   Lipid Panel  Lab Results  Component Value Date   CHOL 185 11/19/2021   HDL 68 11/19/2021   LDLCALC 105 (H) 11/19/2021   TRIG 65 11/19/2021   CHOLHDL 2.7 11/19/2021     Updated lab needed at/ before next visit.

## 2022-05-19 NOTE — Progress Notes (Signed)
Cheyenne Harris     MRN: [id number]      DOB: 1966-05-27   HPI [name] is here for follow up and re-evaluation of chronic medical conditions, medication management and review of any available recent lab and radiology data.  Preventive health is updated, specifically  Cancer screening and Immunization.   Questions or concerns regarding consultations or procedures which the PT has had in the interim are  addressed. The PT denies any adverse reactions to current medications since the last visit. C/o failure to lose weight despite dietary modification  and regular exercise   ROS Denies recent fever or chills. Denies sinus pressure, nasal congestion, ear pain or sore throat. Denies chest congestion, productive cough or wheezing. Denies chest pains, palpitations and leg swelling Denies abdominal pain, nausea, vomiting,diarrhea or constipation.   Denies dysuria, frequency, hesitancy or incontinence. Denies joint pain, swelling and limitation in mobility. Denies headaches, seizures, numbness, or tingling. Denies depression, anxiety or insomnia. Denies skin break down or rash.   PE  BP 126/76   Pulse 67   Resp 16   Ht '5\' 4"'$  (1.626 m)   Wt 160 lb 1.9 oz (72.6 kg)   LMP 10/11/2014 Comment: hysterectomy  SpO2 96%   BMI 27.48 kg/m   Patient alert and oriented and in no cardiopulmonary distress.  HEENT: No facial asymmetry, EOMI,     Neck supple .  Chest: Clear to auscultation bilaterally.  CVS: S1, S2 no murmurs, no S3.Regular rate.  ABD: Soft non tender.   Ext: No edema  MS: Adequate ROM spine, shoulders, hips and knees.  Skin: Intact, no ulcerations or rash noted.  Psych: Good eye contact, normal affect. Memory intact not anxious or depressed appearing.  CNS: CN 2-12 intact, power,  normal throughout.no focal deficits noted.   Assessment & Plan  Essential hypertension Controlled, no change in medication DASH diet and commitment to daily physical activity for a  minimum of 30 minutes discussed and encouraged, as a part of hypertension management. The importance of attaining a healthy weight is also discussed.     05/19/2022    4:03 PM 05/19/2022    3:34 PM 12/16/2021    3:20 PM 11/18/2021    3:24 PM 10/18/2020    8:12 AM 05/06/2020   10:00 AM 12/05/2019    4:12 PM  BP/Weight  Systolic BP 053 976 734 193 790 240 973  Diastolic BP 70 76 70 72 79 81 79  Wt. (Lbs)  160.12 160 161 162  145  BMI  27.48 kg/m2 27.46 kg/m2 27.64 kg/m2 27.81 kg/m2  24.89 kg/m2       Hyperlipidemia LDL goal <100 Hyperlipidemia:Low fat diet discussed and encouraged.   Lipid Panel  Lab Results  Component Value Date   CHOL 185 11/19/2021   HDL 68 11/19/2021   LDLCALC 105 (H) 11/19/2021   TRIG 65 11/19/2021   CHOLHDL 2.7 11/19/2021     Updated lab needed at/ before next visit.   Overweight (BMI 25.0-29.9)  Patient re-educated about  the importance of commitment to a  minimum of 150 minutes of exercise per week as able.  The importance of healthy food choices with portion control discussed, as well as eating regularly and within a 12 hour window most days. The need to choose "clean , green" food 50 to 75% of the time is discussed, as well as to make water the primary drink and set a goal of 64 ounces water daily.  05/19/2022    3:34 PM 12/16/2021    3:20 PM 11/18/2021    3:24 PM  Weight /BMI  Weight 160 lb 1.9 oz 160 lb 161 lb  Height '5\' 4"'$  (1.626 m) '5\' 4"'$  (1.626 m) '5\' 4"'$  (1.626 m)  BMI 27.48 kg/m2 27.46 kg/m2 27.64 kg/m2

## 2022-06-09 ENCOUNTER — Other Ambulatory Visit: Payer: Self-pay | Admitting: Family Medicine

## 2022-10-29 ENCOUNTER — Other Ambulatory Visit: Payer: Self-pay | Admitting: Family Medicine

## 2022-11-17 ENCOUNTER — Ambulatory Visit: Payer: 59 | Admitting: Family Medicine

## 2022-11-17 ENCOUNTER — Other Ambulatory Visit (HOSPITAL_COMMUNITY): Payer: Self-pay | Admitting: Family Medicine

## 2022-11-17 ENCOUNTER — Encounter: Payer: Self-pay | Admitting: Family Medicine

## 2022-11-17 VITALS — BP 118/76 | HR 73 | Ht 64.0 in | Wt 157.1 lb

## 2022-11-17 DIAGNOSIS — E785 Hyperlipidemia, unspecified: Secondary | ICD-10-CM | POA: Diagnosis not present

## 2022-11-17 DIAGNOSIS — Z1211 Encounter for screening for malignant neoplasm of colon: Secondary | ICD-10-CM

## 2022-11-17 DIAGNOSIS — E559 Vitamin D deficiency, unspecified: Secondary | ICD-10-CM

## 2022-11-17 DIAGNOSIS — I10 Essential (primary) hypertension: Secondary | ICD-10-CM

## 2022-11-17 DIAGNOSIS — E663 Overweight: Secondary | ICD-10-CM

## 2022-11-17 DIAGNOSIS — H1031 Unspecified acute conjunctivitis, right eye: Secondary | ICD-10-CM

## 2022-11-17 DIAGNOSIS — Z1231 Encounter for screening mammogram for malignant neoplasm of breast: Secondary | ICD-10-CM

## 2022-11-17 MED ORDER — CEPHALEXIN 500 MG PO CAPS: 500.0000 mg | ORAL_CAPSULE | Freq: Two times a day (BID) | ORAL | 0 refills | Status: DC

## 2022-11-17 MED ORDER — AMLODIPINE BESYLATE 2.5 MG PO TABS: 2.5000 mg | ORAL_TABLET | Freq: Every day | ORAL | 3 refills | Status: DC

## 2022-11-17 MED ORDER — AMLODIPINE BESYLATE 5 MG PO TABS: 5.0000 mg | ORAL_TABLET | Freq: Every day | ORAL | 3 refills | Status: DC

## 2022-11-17 NOTE — Patient Instructions (Signed)
Follow-up in 7 months for annual exam, call if you need me sooner.  5-day course of antibiotic Keflex as prescribed for draining right eye for past 3 days.  If this does not improve in the next 48 hours you do need to see an eye specialist to examine and treat the eye.  Please schedule your mammogram which is due.  Nurse please arrange for Cologuard testing.  Please do get fasting blood work within the next 1 to 2 weeks.  CBC lipid CMP and EGFR TSH vitamin D.  Continue excellent health habits and continue to work on improving them.  COVID-vaccine is recommended.  Best for 2024, plan to see you later in the year, please call if you need me sooner.  Thanks for choosing [facility], we consider it a privelige to serve you.

## 2022-11-18 DIAGNOSIS — H109 Unspecified conjunctivitis: Secondary | ICD-10-CM | POA: Insufficient documentation

## 2022-11-18 NOTE — Assessment & Plan Note (Signed)
Controlled, no change in medication DASH diet and commitment to daily physical activity for a minimum of 30 minutes discussed and encouraged, as a part of hypertension management. The importance of attaining a healthy weight is also discussed.     11/17/2022    3:10 PM 05/19/2022    4:03 PM 05/19/2022    3:34 PM 12/16/2021    3:20 PM 11/18/2021    3:24 PM 10/18/2020    8:12 AM 05/06/2020   10:00 AM  BP/Weight  Systolic BP [id number] 123456 [id number]  Diastolic BP 76 70 76 70 72 79 81  Wt. (Lbs) 157.12  160.12 160 161 162   BMI 26.97 kg/m2  27.48 kg/m2 27.46 kg/m2 27.64 kg/m2 27.81 kg/m2

## 2022-11-18 NOTE — Progress Notes (Signed)
Cheyenne Harris     MRN: [id number]      DOB: 1966-07-21   HPI Ms. Becherer is here for follow up and re-evaluation of chronic medical conditions, medication management and review of any available recent lab and radiology data.  Preventive health is updated, specifically  Cancer screening and Immunization.   Questions or concerns regarding consultations or procedures which the PT has had in the interim are  addressed. The PT denies any adverse reactions to current medications since the last visit.  3 day h/o watery red right eye with sticky drainage in the am, no vision change, no trauma , wears contact lenses  ROS Denies recent fever or chills. Denies sinus pressure, nasal congestion, ear pain or sore throat. Denies chest congestion, productive cough or wheezing. Denies chest pains, palpitations and leg swelling Denies abdominal pain, nausea, vomiting,diarrhea or constipation.   Denies dysuria, frequency, hesitancy or incontinence. Denies joint pain, swelling and limitation in mobility. Denies headaches, seizures, numbness, or tingling. Denies depression, anxiety or insomnia. Denies skin break down or rash.   PE  BP 118/76 (BP Location: Right Arm, Patient Position: Sitting, Cuff Size: Large)   Pulse 73   Ht 5' 4"$  (1.626 m)   Wt 157 lb 1.9 oz (71.3 kg)   LMP 10/11/2014 Comment: hysterectomy  SpO2 94%   BMI 26.97 kg/m   Patient alert and oriented and in no cardiopulmonary distress.  HEENT: No facial asymmetry, EOMI,     Neck supple .Right eye red and watery, EOMI  Chest: Clear to auscultation bilaterally.  CVS: S1, S2 no murmurs, no S3.Regular rate.  ABD: Soft non tender.   Ext: No edema  MS: Adequate ROM spine, shoulders, hips and knees.  Skin: Intact, no ulcerations or rash noted.  Psych: Good eye contact, normal affect. Memory intact not anxious or depressed appearing.  CNS: CN 2-12 intact, power,  normal throughout.no focal deficits noted.   Assessment &  Plan  Essential hypertension Controlled, no change in medication DASH diet and commitment to daily physical activity for a minimum of 30 minutes discussed and encouraged, as a part of hypertension management. The importance of attaining a healthy weight is also discussed.     11/17/2022    3:10 PM 05/19/2022    4:03 PM 05/19/2022    3:34 PM 12/16/2021    3:20 PM 11/18/2021    3:24 PM 10/18/2020    8:12 AM 05/06/2020   10:00 AM  BP/Weight  Systolic BP [id number] 123456 [id number]  Diastolic BP 76 70 76 70 72 79 81  Wt. (Lbs) 157.12  160.12 160 161 162   BMI 26.97 kg/m2  27.48 kg/m2 27.46 kg/m2 27.64 kg/m2 27.81 kg/m2        Hyperlipidemia LDL goal <100 Hyperlipidemia:Low fat diet discussed and encouraged.   Lipid Panel  Lab Results  Component Value Date   CHOL 185 11/19/2021   HDL 68 11/19/2021   LDLCALC 105 (H) 11/19/2021   TRIG 65 11/19/2021   CHOLHDL 2.7 11/19/2021     Updated lab needed at/ before next visit. Not at goal  Overweight (BMI 25.0-29.9)  Patient re-educated about  the importance of commitment to a  minimum of 150 minutes of exercise per week as able.  The importance of healthy food choices with portion control discussed, as well as eating regularly and within a 12 hour window most days. The need to choose "clean , green" food 50 to 75% of the time is  discussed, as well as to make water the primary drink and set a goal of 64 ounces water daily.       11/17/2022    3:10 PM 05/19/2022    3:34 PM 12/16/2021    3:20 PM  Weight /BMI  Weight 157 lb 1.9 oz 160 lb 1.9 oz 160 lb  Height 5' 4"$  (1.626 m) 5' 4"$  (1.626 m) 5' 4"$  (1.626 m)  BMI 26.97 kg/m2 27.48 kg/m2 27.46 kg/m2      Conjunctivitis, right eye Keflex prescribed , if fails to improve or worsens , see opthalmology

## 2022-11-18 NOTE — Assessment & Plan Note (Signed)
Keflex prescribed , if fails to improve or worsens , see opthalmology

## 2022-11-18 NOTE — Assessment & Plan Note (Signed)
  Patient re-educated about  the importance of commitment to a  minimum of 150 minutes of exercise per week as able.  The importance of healthy food choices with portion control discussed, as well as eating regularly and within a 12 hour window most days. The need to choose "clean , green" food 50 to 75% of the time is discussed, as well as to make water the primary drink and set a goal of 64 ounces water daily.       11/17/2022    3:10 PM 05/19/2022    3:34 PM 12/16/2021    3:20 PM  Weight /BMI  Weight 157 lb 1.9 oz 160 lb 1.9 oz 160 lb  Height '5\' 4"'$  (1.626 m) '5\' 4"'$  (1.626 m) '5\' 4"'$  (1.626 m)  BMI 26.97 kg/m2 27.48 kg/m2 27.46 kg/m2

## 2022-11-18 NOTE — Assessment & Plan Note (Signed)
Hyperlipidemia:Low fat diet discussed and encouraged.   Lipid Panel  Lab Results  Component Value Date   CHOL 185 11/19/2021   HDL 68 11/19/2021   LDLCALC 105 (H) 11/19/2021   TRIG 65 11/19/2021   CHOLHDL 2.7 11/19/2021     Updated lab needed at/ before next visit. Not at goal

## 2022-11-20 ENCOUNTER — Encounter (HOSPITAL_COMMUNITY): Payer: Self-pay

## 2022-11-20 ENCOUNTER — Ambulatory Visit (HOSPITAL_COMMUNITY)
Admission: RE | Admit: 2022-11-20 | Discharge: 2022-11-20 | Disposition: A | Discharge status: Home / Self Care | Payer: 59 | Source: Ambulatory Visit | Attending: Family Medicine | Admitting: Family Medicine

## 2022-11-20 DIAGNOSIS — Z1231 Encounter for screening mammogram for malignant neoplasm of breast: Secondary | ICD-10-CM | POA: Diagnosis present

## 2022-11-20 LAB — LIPID PANEL
Chol/HDL Ratio: 2.3 ratio (ref 0.0–4.4)
Cholesterol, Total: 176 mg/dL (ref 100–199)
HDL: 78 mg/dL (ref >39)
LDL Chol Calc (NIH): 91 mg/dL (ref 0–99)
Triglycerides: 31 mg/dL (ref 0–149)
VLDL Cholesterol Cal: 7 mg/dL (ref 5–40)

## 2022-11-20 LAB — VITAMIN D 25 HYDROXY (VIT D DEFICIENCY, FRACTURES): Vit D, 25-Hydroxy: 66.5 ng/mL (ref 30.0–100.0)

## 2022-11-20 LAB — CMP14+EGFR
ALT: 16 IU/L (ref 0–32)
AST: 20 IU/L (ref 0–40)
Albumin/Globulin Ratio: 1.7 (ref 1.2–2.2)
Albumin: 4.3 g/dL (ref 3.8–4.9)
Alkaline Phosphatase: 113 IU/L (ref 44–121)
BUN/Creatinine Ratio: 18 (ref 9–23)
BUN: 17 mg/dL (ref 6–24)
Bilirubin Total: 0.6 mg/dL (ref 0.0–1.2)
CO2: 23 mmol/L (ref 20–29)
Calcium: 9.9 mg/dL (ref 8.7–10.2)
Chloride: 101 mmol/L (ref 96–106)
Creatinine, Ser: 0.96 mg/dL (ref 0.57–1.00)
Globulin, Total: 2.6 g/dL (ref 1.5–4.5)
Glucose: 91 mg/dL (ref 70–99)
Potassium: 4.5 mmol/L (ref 3.5–5.2)
Sodium: 142 mmol/L (ref 134–144)
Total Protein: 6.9 g/dL (ref 6.0–8.5)
eGFR: 70 mL/min/{1.73_m2} (ref >59)

## 2022-11-20 LAB — CBC
Hematocrit: 41.3 % (ref 34.0–46.6)
Hemoglobin: 13.6 g/dL (ref 11.1–15.9)
MCH: 28.5 pg (ref 26.6–33.0)
MCHC: 32.9 g/dL (ref 31.5–35.7)
MCV: 86 fL (ref 79–97)
Platelets: 186 10*3/uL (ref 150–450)
RBC: 4.78 x10E6/uL (ref 3.77–5.28)
RDW: 13.2 % (ref 11.7–15.4)
WBC: 4.6 10*3/uL (ref 3.4–10.8)

## 2022-11-20 LAB — TSH: TSH: 1.41 u[IU]/mL (ref 0.450–4.500)

## 2023-04-04 ENCOUNTER — Other Ambulatory Visit: Payer: Self-pay | Admitting: Family Medicine

## 2023-06-18 ENCOUNTER — Encounter: Payer: 59 | Admitting: Family Medicine

## 2023-07-06 ENCOUNTER — Ambulatory Visit (INDEPENDENT_AMBULATORY_CARE_PROVIDER_SITE_OTHER): Payer: 59 | Admitting: Family Medicine

## 2023-07-06 ENCOUNTER — Encounter: Payer: Self-pay | Admitting: Family Medicine

## 2023-07-06 VITALS — BP 119/75 | HR 69 | Ht 64.0 in | Wt 152.1 lb

## 2023-07-06 DIAGNOSIS — I10 Essential (primary) hypertension: Secondary | ICD-10-CM | POA: Diagnosis not present

## 2023-07-06 DIAGNOSIS — Z1231 Encounter for screening mammogram for malignant neoplasm of breast: Secondary | ICD-10-CM

## 2023-07-06 DIAGNOSIS — Z0000 Encounter for general adult medical examination without abnormal findings: Secondary | ICD-10-CM | POA: Diagnosis not present

## 2023-07-06 DIAGNOSIS — E785 Hyperlipidemia, unspecified: Secondary | ICD-10-CM | POA: Diagnosis not present

## 2023-07-06 DIAGNOSIS — E663 Overweight: Secondary | ICD-10-CM | POA: Diagnosis not present

## 2023-07-06 MED ORDER — SEMAGLUTIDE-WEIGHT MANAGEMENT 0.25 MG/0.5ML ~~LOC~~ SOAJ: 0.2500 mg | SUBCUTANEOUS | 0 refills | Status: DC

## 2023-07-06 NOTE — Patient Instructions (Addendum)
F/U in January, call if you need me sooner, WEIGHT LOSS GOAL OF 4 TO 6 POUNDS  Flu vaccine in 2 to 3 weeks, also get the covid  vaccine please  Please schedule mammogram at checkout  I have prescribed wegovy for weight loss,pls send message via my cahart if unable to fill, I will try zepbound and if not covered low dose phentermine  Eat your heaviest meal at work early aftrnoon, and take an additional snack/meal eg heALTHY SANDWICH TO EAT Before YOU LEAVE WORK, AVOID FAST FOOD!     FASTING LIPID, CMP AND EgfR IN NEXT 1 WEEK PLEASE  It is important that you exercise regularly at least 30 minutes 5 times a week. If you develop chest pain, have severe difficulty breathing, or feel very tired, stop exercising immediately and seek medical attention   Think about what you will eat, plan ahead. Choose " clean, green, fresh or frozen" over canned, processed or packaged foods which are more sugary, salty and fatty. 70 to 75% of food eaten should be vegetables and fruit. Three meals at set times with snacks allowed between meals, but they must be fruit or vegetables. Aim to eat over a 12 hour period , example 7 am to 7 pm, and STOP after  your last meal of the day. Drink water,generally about 64 ounces per day, no other drink is as healthy. Fruit juice is best enjoyed in a healthy way, by EATING the fruit. Thanks for choosing [facility], we consider it a privelige to serve you.

## 2023-07-07 ENCOUNTER — Encounter: Payer: Self-pay | Admitting: Family Medicine

## 2023-07-07 DIAGNOSIS — Z0000 Encounter for general adult medical examination without abnormal findings: Secondary | ICD-10-CM | POA: Insufficient documentation

## 2023-07-07 NOTE — Progress Notes (Signed)
    Cheyenne Harris     MRN: [id number]      DOB: 1966/09/28  Chief Complaint  Patient presents with   Annual Exam    CPE     HPI: Patient is in for annual physical exam.Requests mediction to help with weight loss so that she can " get off blood pressure and/ or cholesterol medication   Immunization is reviewed , and  " had influenza" 1 wek ago, will hold off on immunizing for an additional 2 to 4 weeks  PE: BP 119/75 (BP Location: Right Arm, Patient Position: Sitting, Cuff Size: Normal)   Pulse 69   Ht 5\' 4"  (1.626 m)   Wt 152 lb 1.9 oz (69 kg)   LMP 10/11/2014 Comment: hysterectomy  SpO2 98%   BMI 26.11 kg/m   Pleasant  female, alert and oriented x 3, in no cardio-pulmonary distress. Afebrile. HEENT No facial trauma or asymetry. Sinuses non tender.  Extra occullar muscles intact.. External ears normal, . Neck: supple, no adenopathy,JVD or thyromegaly.No bruits.  Chest: Clear to ascultation bilaterally.No crackles or wheezes. Non tender to palpation   Cardiovascular system; Heart sounds normal,  S1 and  S2 ,no S3.  No murmur, or thrill. Apical beat not displaced Peripheral pulses normal.  Abdomen: Soft, non tender, no organomegaly or masses. No bruits. Bowel sounds normal. No guarding, tenderness or rebound.   Musculoskeletal exam: Full ROM of spine, hips , shoulders and knees. No deformity ,swelling or crepitus noted. No muscle wasting or atrophy.   Neurologic: Cranial nerves 2 to 12 intact. Power, tone ,sensation and reflexes normal throughout. No disturbance in gait. No tremor.  Skin: Intact, no ulceration, erythema , scaling or rash noted. Pigmentation normal throughout  Psych; Normal mood and affect. Judgement and concentration normal   Assessment & Plan:  Encounter for annual health examination Annual exam as documented. Counseling done  re healthy lifestyle involving commitment to 150 minutes exercise per week, heart healthy diet, and  attaining healthy weight.The importance of adequate sleep also discussed. Changes in health habits are decided on by the patient with goals and time frames  set for achieving them. Immunization and cancer screening needs are specifically addressed at this visit.   Overweight (BMI 25.0-29.9)  Patient re-educated about  the importance of commitment to a  minimum of 150 minutes of exercise per week as able.  The importance of healthy food choices with portion control discussed, as well as eating regularly and within a 12 hour window most days. The need to choose "clean , green" food 50 to 75% of the time is discussed, as well as to make water the primary drink and set a goal of 64 ounces water daily.       07/06/2023    3:39 PM 11/17/2022    3:10 PM 05/19/2022    3:34 PM  Weight /BMI  Weight 152 lb 1.9 oz 157 lb 1.9 oz 160 lb 1.9 oz  Height 5\' 4"  (1.626 m) 5\' 4"  (1.626 m) 5\' 4"  (1.626 m)  BMI 26.11 kg/m2 26.97 kg/m2 27.48 kg/m2    Start approved medication for weight loss, Wegovy, zepbound and lastly phentermine if neither of the first 2 are covered

## 2023-07-07 NOTE — Assessment & Plan Note (Signed)
  Patient re-educated about  the importance of commitment to a  minimum of 150 minutes of exercise per week as able.  The importance of healthy food choices with portion control discussed, as well as eating regularly and within a 12 hour window most days. The need to choose "clean , green" food 50 to 75% of the time is discussed, as well as to make water the primary drink and set a goal of 64 ounces water daily.       07/06/2023    3:39 PM 11/17/2022    3:10 PM 05/19/2022    3:34 PM  Weight /BMI  Weight 152 lb 1.9 oz 157 lb 1.9 oz 160 lb 1.9 oz  Height 5\' 4"  (1.626 m) 5\' 4"  (1.626 m) 5\' 4"  (1.626 m)  BMI 26.11 kg/m2 26.97 kg/m2 27.48 kg/m2    Start approved medication for weight loss, Wegovy, zepbound and lastly phentermine if neither of the first 2 are covered

## 2023-07-07 NOTE — Assessment & Plan Note (Signed)
Annual exam as documented. Counseling done  re healthy lifestyle involving commitment to 150 minutes exercise per week, heart healthy diet, and attaining healthy weight.The importance of adequate sleep also discussed. Changes in health habits are decided on by the patient with goals and time frames  set for achieving them. Immunization and cancer screening needs are specifically addressed at this visit. 

## 2023-07-08 ENCOUNTER — Other Ambulatory Visit: Payer: Self-pay | Admitting: Family Medicine

## 2023-07-08 MED ORDER — PHENTERMINE HCL 15 MG PO CAPS: 15.0000 mg | ORAL_CAPSULE | ORAL | 2 refills | Status: DC

## 2023-07-13 LAB — CMP14+EGFR
ALT: 15 [IU]/L (ref 0–32)
AST: 21 [IU]/L (ref 0–40)
Albumin: 4.1 g/dL (ref 3.8–4.9)
Alkaline Phosphatase: 116 [IU]/L (ref 44–121)
BUN/Creatinine Ratio: 16 (ref 9–23)
BUN: 14 mg/dL (ref 6–24)
Bilirubin Total: 0.6 mg/dL (ref 0.0–1.2)
CO2: 24 mmol/L (ref 20–29)
Calcium: 9.7 mg/dL (ref 8.7–10.2)
Chloride: 104 mmol/L (ref 96–106)
Creatinine, Ser: 0.88 mg/dL (ref 0.57–1.00)
Globulin, Total: 2.7 g/dL (ref 1.5–4.5)
Glucose: 85 mg/dL (ref 70–99)
Potassium: 4.3 mmol/L (ref 3.5–5.2)
Sodium: 141 mmol/L (ref 134–144)
Total Protein: 6.8 g/dL (ref 6.0–8.5)
eGFR: 77 mL/min/{1.73_m2} (ref >59)

## 2023-07-13 LAB — LIPID PANEL
Chol/HDL Ratio: 2.8 {ratio} (ref 0.0–4.4)
Cholesterol, Total: 169 mg/dL (ref 100–199)
HDL: 61 mg/dL (ref >39)
LDL Chol Calc (NIH): 97 mg/dL (ref 0–99)
Triglycerides: 53 mg/dL (ref 0–149)
VLDL Cholesterol Cal: 11 mg/dL (ref 5–40)

## 2023-07-14 ENCOUNTER — Telehealth: Payer: Self-pay | Admitting: Family Medicine

## 2023-07-14 NOTE — Telephone Encounter (Signed)
Phentermine approved.

## 2023-08-15 IMAGING — MG MM DIGITAL SCREENING BILAT W/ TOMO AND CAD
8 series · 9 of 24 positions shown · non-contrast
Comparison: Previous exam(s).

CLINICAL DATA: Screening.

EXAM:
DIGITAL SCREENING BILATERAL MAMMOGRAM WITH TOMOSYNTHESIS AND CAD
TECHNIQUE: Bilateral screening digital craniocaudal and mediolateral oblique
mammograms were obtained. Bilateral screening digital breast
tomosynthesis was performed. The images were evaluated with
computer-aided detection.

[L MLO synth-2D]
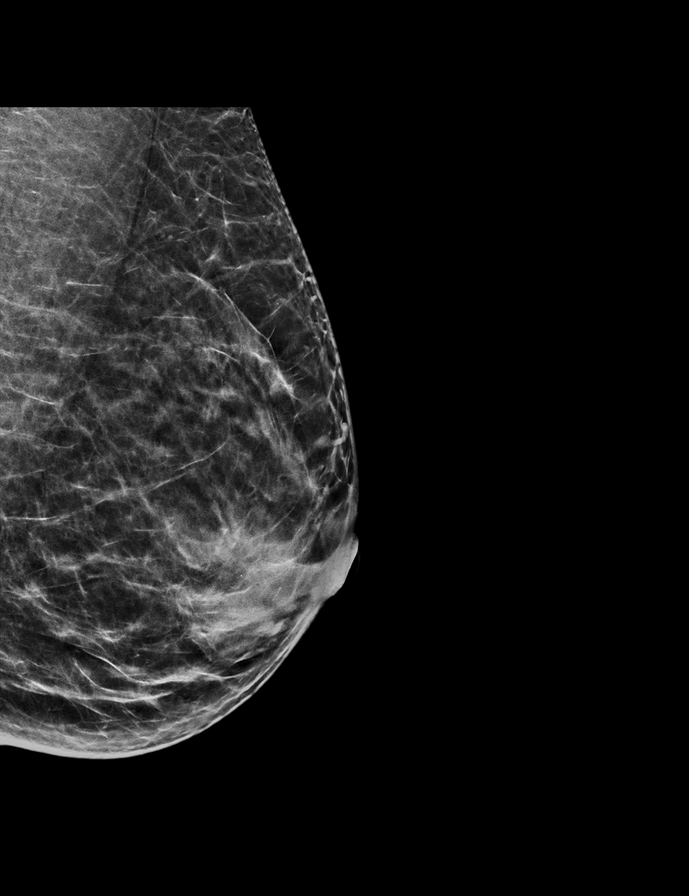

[R MLO synth-2D]
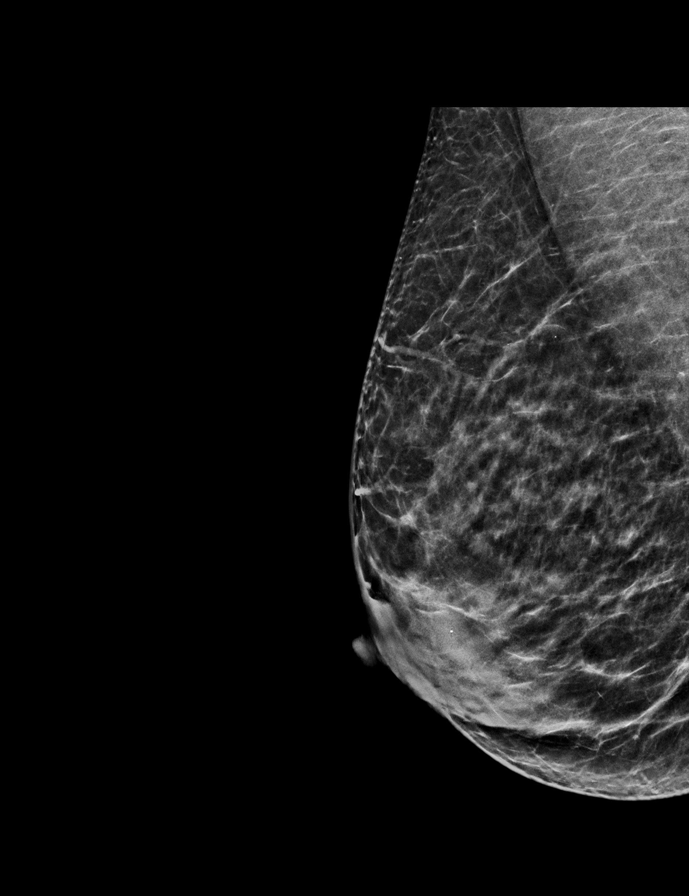

[L CC synth-2D]
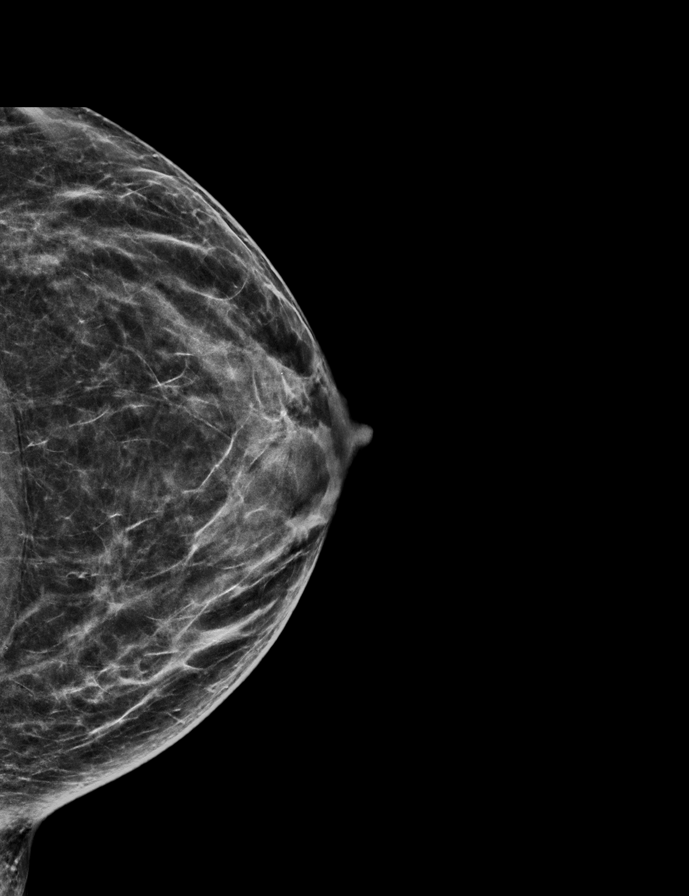

[R CC synth-2D]
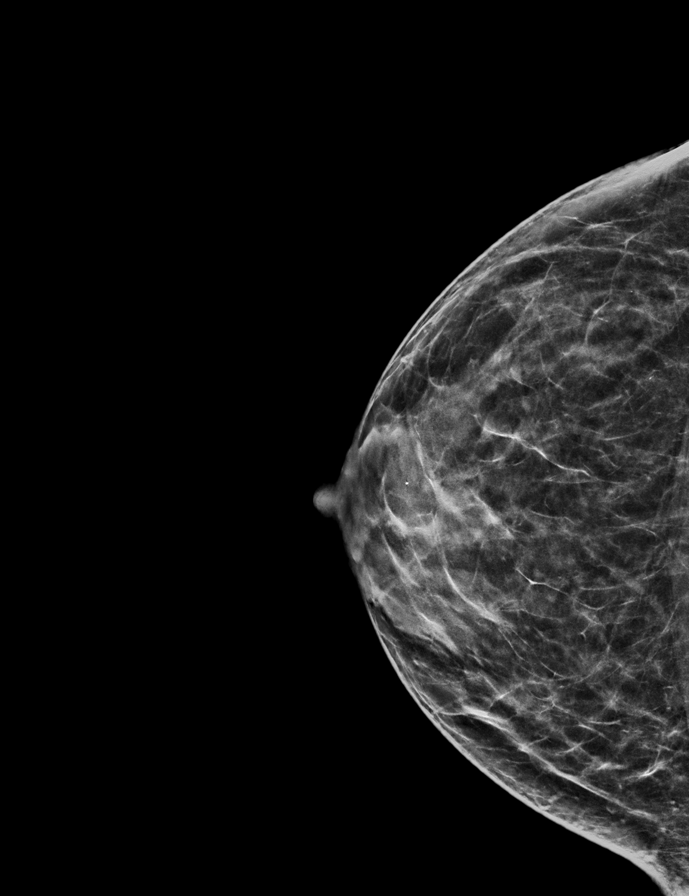

[L CC tomo · 2 of 56 frames shown]
[frame 19/56]
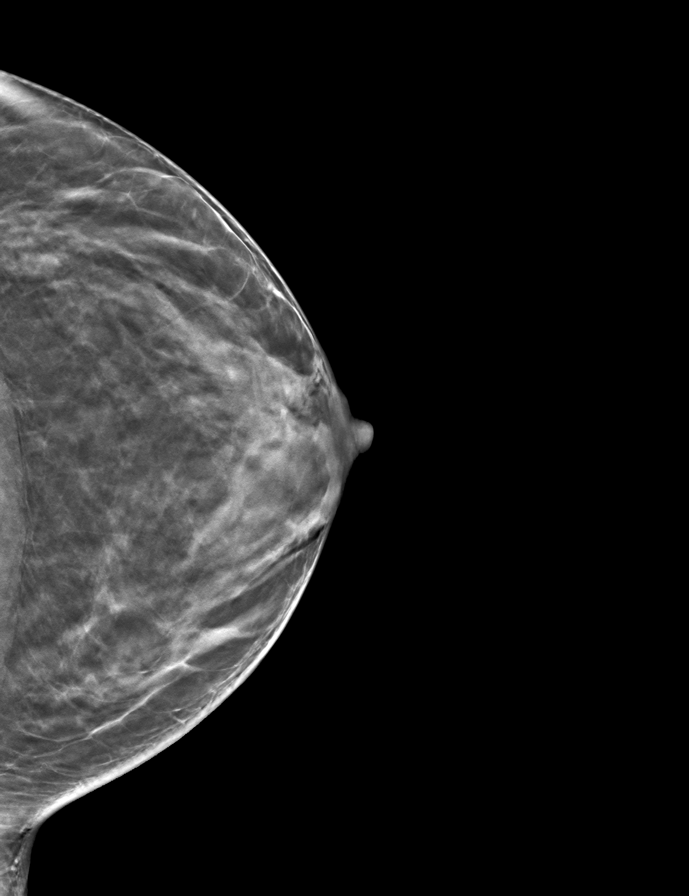
[frame 29/56]
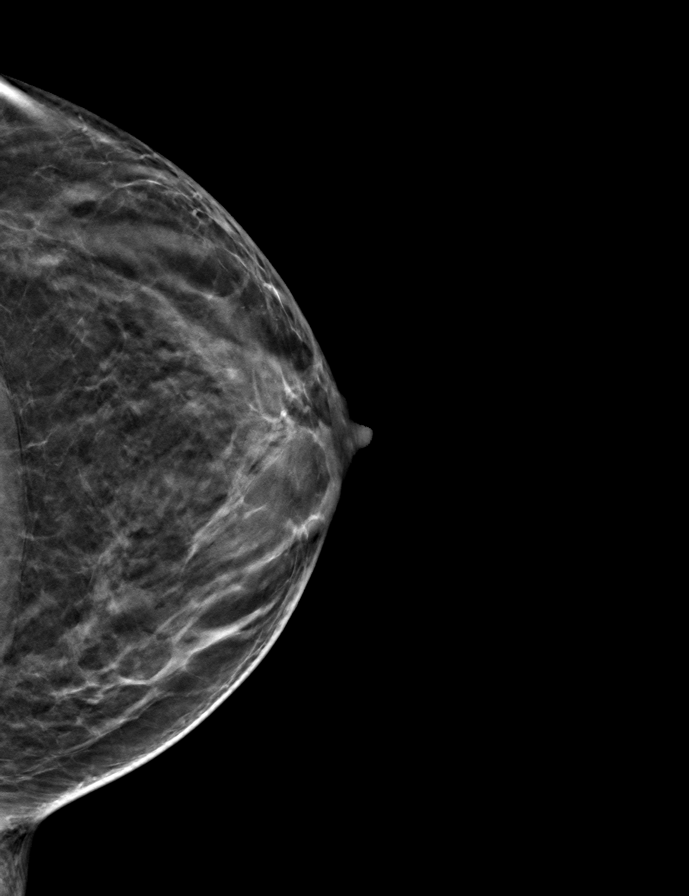

[R CC tomo · tomo slice 29/57.0]
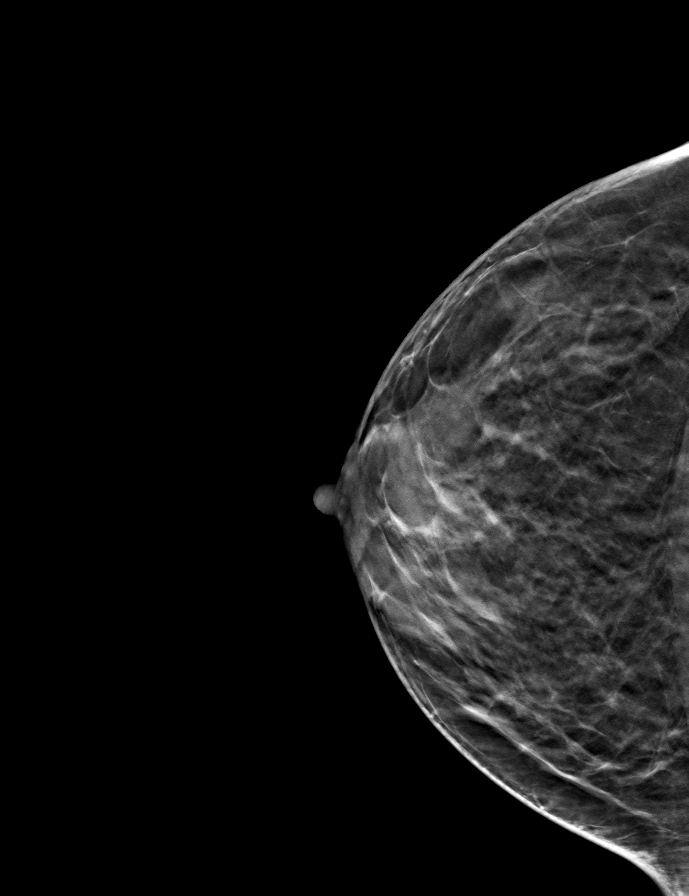

[R MLO tomo · tomo slice 27/54.0]
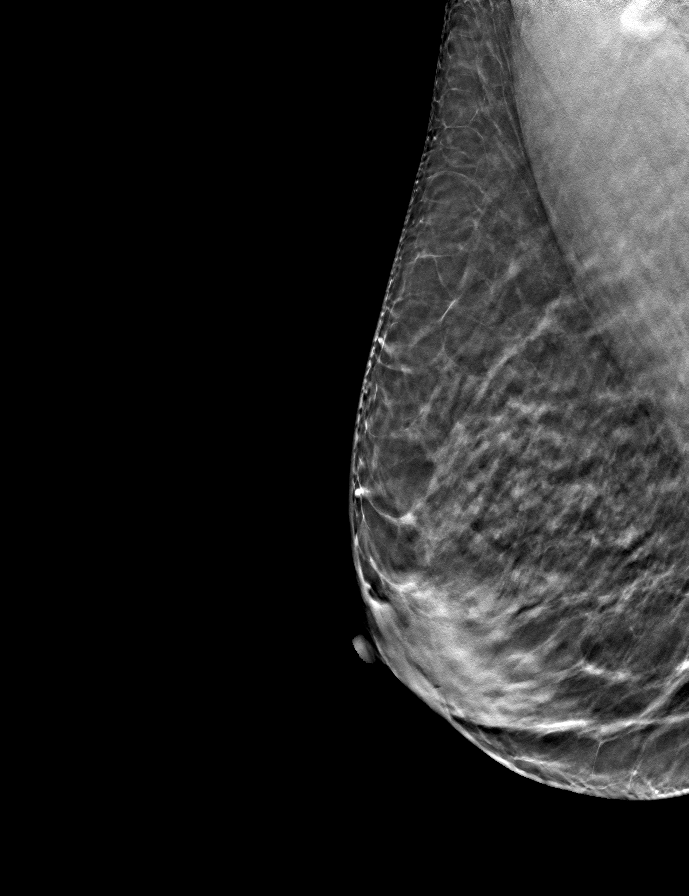

[L MLO tomo · tomo slice 27/54.0]
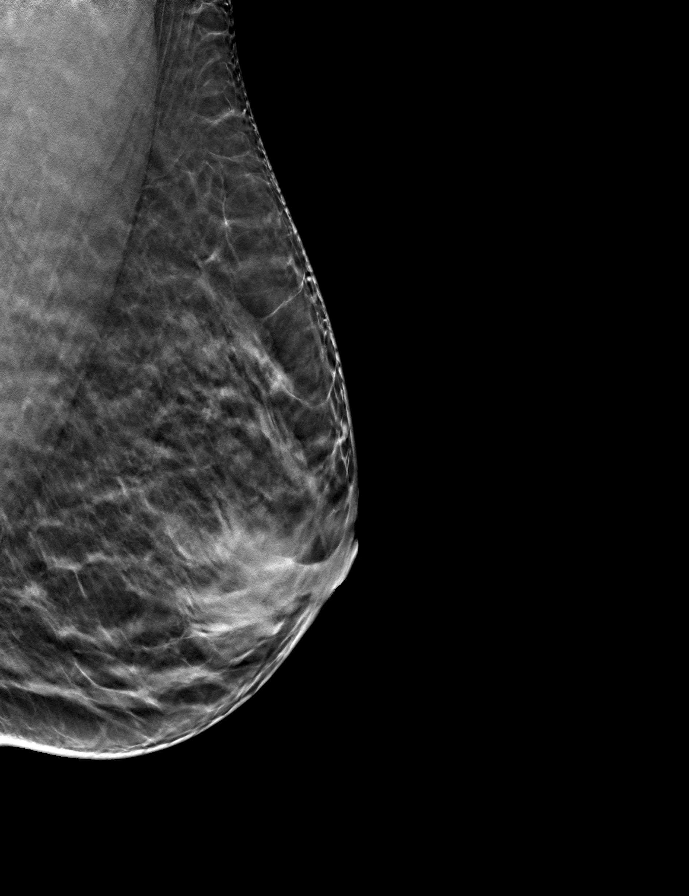

[9 of 24 positions shown; findings below may reference images not displayed]

ACR Breast Density Category c: The breast tissue is heterogeneously
dense, which may obscure small masses.
FINDINGS: There are no findings suspicious for malignancy.
IMPRESSION: No mammographic evidence of malignancy. A result letter of this
screening mammogram will be mailed directly to the patient.

RECOMMENDATION:
Screening mammogram in one year. (Code:[id number]-W-BC3)

BI-RADS CATEGORY  1: Negative.

## 2023-08-21 ENCOUNTER — Other Ambulatory Visit: Payer: Self-pay | Admitting: Family Medicine

## 2023-08-28 ENCOUNTER — Other Ambulatory Visit: Payer: Self-pay | Admitting: Family Medicine

## 2023-10-13 ENCOUNTER — Ambulatory Visit: Payer: 59 | Admitting: Family Medicine

## 2023-11-22 ENCOUNTER — Inpatient Hospital Stay (HOSPITAL_COMMUNITY): Admission: RE | Admit: 2023-11-22 | Payer: 59 | Source: Ambulatory Visit

## 2023-11-22 ENCOUNTER — Encounter (HOSPITAL_COMMUNITY): Payer: Self-pay

## 2023-11-24 HISTORY — PX: MENISCUS REPAIR: SHX5179

## 2023-11-30 ENCOUNTER — Ambulatory Visit: Payer: Self-pay | Admitting: Family Medicine

## 2023-12-13 ENCOUNTER — Ambulatory Visit (HOSPITAL_COMMUNITY)
Admission: RE | Admit: 2023-12-13 | Discharge: 2023-12-13 | Disposition: A | Discharge status: Home / Self Care | Payer: 59 | Source: Ambulatory Visit | Attending: Family Medicine | Admitting: Family Medicine

## 2023-12-13 DIAGNOSIS — Z1231 Encounter for screening mammogram for malignant neoplasm of breast: Secondary | ICD-10-CM | POA: Insufficient documentation

## 2023-12-15 ENCOUNTER — Encounter: Payer: Self-pay | Admitting: Family Medicine

## 2023-12-16 ENCOUNTER — Other Ambulatory Visit (HOSPITAL_COMMUNITY): Payer: Self-pay | Admitting: Family Medicine

## 2023-12-16 DIAGNOSIS — R928 Other abnormal and inconclusive findings on diagnostic imaging of breast: Secondary | ICD-10-CM

## 2023-12-21 ENCOUNTER — Encounter (HOSPITAL_COMMUNITY): Payer: Self-pay

## 2023-12-21 ENCOUNTER — Ambulatory Visit (HOSPITAL_COMMUNITY)
Admission: RE | Admit: 2023-12-21 | Discharge: 2023-12-21 | Disposition: A | Discharge status: Home / Self Care | Source: Ambulatory Visit | Attending: Family Medicine | Admitting: Family Medicine

## 2023-12-21 ENCOUNTER — Other Ambulatory Visit (HOSPITAL_COMMUNITY): Payer: Self-pay | Admitting: Family Medicine

## 2023-12-21 DIAGNOSIS — R928 Other abnormal and inconclusive findings on diagnostic imaging of breast: Secondary | ICD-10-CM | POA: Diagnosis present

## 2023-12-21 DIAGNOSIS — R921 Mammographic calcification found on diagnostic imaging of breast: Secondary | ICD-10-CM

## 2023-12-22 ENCOUNTER — Ambulatory Visit
Admission: RE | Admit: 2023-12-22 | Discharge: 2023-12-22 | Disposition: A | Discharge status: Home / Self Care | Source: Ambulatory Visit | Attending: Family Medicine | Admitting: Family Medicine

## 2023-12-22 ENCOUNTER — Other Ambulatory Visit: Payer: Self-pay | Admitting: Family Medicine

## 2023-12-22 DIAGNOSIS — R921 Mammographic calcification found on diagnostic imaging of breast: Secondary | ICD-10-CM

## 2023-12-22 HISTORY — PX: BREAST BIOPSY: SHX20

## 2023-12-23 LAB — SURGICAL PATHOLOGY

## 2023-12-24 ENCOUNTER — Telehealth: Payer: Self-pay | Admitting: *Deleted

## 2023-12-24 NOTE — Telephone Encounter (Signed)
 Spoke to patient to confirm upcoming morning [facility] clinic appointment on 3/26, paperwork will be sent via email.  Gave location and time, also informed patient that the surgeon's office would be calling as well to get information from them similar to the packet that they will be receiving so make sure to do both.  Reminded patient that all providers will be coming to the clinic to see them HERE and if they had any questions to not hesitate to reach back out to myself or their navigators.

## 2023-12-27 DIAGNOSIS — D0512 Intraductal carcinoma in situ of left breast: Secondary | ICD-10-CM | POA: Insufficient documentation

## 2023-12-27 NOTE — Progress Notes (Signed)
 Radiation Oncology         [phone] ________________________________  Name: Cheyenne Harris        MRN: [id number]  Date of Service: 12/29/2023 DOB: 1965/10/17  NG:[id number], Milus Mallick, MD  Almond Lint, MD     REFERRING PHYSICIAN: Almond Lint, MD   DIAGNOSIS: There were no encounter diagnoses.   HISTORY OF PRESENT ILLNESS: Cheyenne Harris is a 58 y.o. female seen in the multidisciplinary breast clinic for a new diagnosis of left breast cancer. The patient was noted to have screening detected left breast calcifications and asymmetry on 12/13/23. She returned for diagnostic work up on 12/21/23 and this confirmed the persistence of calcifications though the asymmetry resolved. The calcifications were indeterminate, and measured 1.2 cm in the lower outer quadrant of the left breast. A biopsy on 12/22/23 showed  intermediate grade DCIS that was ER positive, PR negative. She's seen to discuss treatment recommendations of her cancer.     PREVIOUS RADIATION THERAPY: {EXAM; YES/NO:19492::"No"}   PAST MEDICAL HISTORY:  Past Medical History:  Diagnosis Date   Complication of anesthesia    Hyperlipidemia    Hypertension    PONV (postoperative nausea and vomiting)        PAST SURGICAL HISTORY: Past Surgical History:  Procedure Laterality Date   ABDOMINAL HYSTERECTOMY     BILATERAL SALPINGECTOMY Bilateral 03/12/2015   Procedure: BILATERAL SALPINGECTOMY;  Surgeon: Tilda Burrow, MD;  Location: [facility];  Service: Gynecology;  Laterality: Bilateral;   BREAST BIOPSY Left 12/22/2023   MM LT BREAST BX W LOC DEV 1ST LESION IMAGE BX SPEC STEREO GUIDE 12/22/2023 [facility]MO[facility]   COLONOSCOPY N/A 04/13/2018   Procedure: COLONOSCOPY;  Surgeon: Malissa Hippo, MD;  Location: [facility];  Service: Endoscopy;  Laterality: N/A;  730   SUPRACERVICAL ABDOMINAL HYSTERECTOMY N/A 03/12/2015   Procedure: HYSTERECTOMY SUPRACERVICAL ABDOMINAL;  Surgeon: Tilda Burrow, MD;  Location: [facility];  Service:  Gynecology;  Laterality: N/A;   WISDOM TOOTH EXTRACTION       FAMILY HISTORY:  Family History  Problem Relation Age of Onset   Hypertension Mother    Hypertension Sister    Hypertension Brother    Diabetes Maternal Grandmother    Heart disease Maternal Grandmother    Hypertension Sister      SOCIAL HISTORY:  reports that she has never smoked. She has never used smokeless tobacco. She reports current alcohol use. She reports that she does not use drugs. The patient is married and lives in [place]. She ***   ALLERGIES: Sulfa antibiotics   MEDICATIONS:  Current Outpatient Medications  Medication Sig Dispense Refill   amLODipine (NORVASC) 2.5 MG tablet Take 1 tablet (2.5 mg total) by mouth daily. 90 tablet 3   amLODipine (NORVASC) 5 MG tablet TAKE 1 TABLET (5 MG TOTAL) BY MOUTH DAILY. 90 tablet 3   Multiple Vitamins-Minerals (MULTIVITAMIN WITH MINERALS) tablet Take 1 tablet by mouth daily.     phentermine 15 MG capsule Take 1 capsule (15 mg total) by mouth every morning. 30 capsule 2   polyethylene glycol powder (GLYCOLAX/MIRALAX) 17 GM/SCOOP powder Take 17 g by mouth in the morning and at bedtime. 510 g 5   rosuvastatin (CRESTOR) 5 MG tablet TAKE 1 TABLET (5 MG TOTAL) BY MOUTH DAILY. 90 tablet 1   No current facility-administered medications for this visit.     REVIEW OF SYSTEMS: On review of systems, the patient reports that she is doing ***     PHYSICAL  EXAM:  Wt Readings from Last 3 Encounters:  07/06/23 152 lb 1.9 oz (69 kg)  11/17/22 157 lb 1.9 oz (71.3 kg)  05/19/22 160 lb 1.9 oz (72.6 kg)   Temp Readings from Last 3 Encounters:  01/22/21 99.8 F (37.7 C)  10/18/20 98.4 F (36.9 C)  10/03/20 99.5 F (37.5 C)   BP Readings from Last 3 Encounters:  07/06/23 119/75  11/17/22 118/76  05/19/22 124/70   Pulse Readings from Last 3 Encounters:  07/06/23 69  11/17/22 73  05/19/22 67    In general this is a well appearing African American female in no acute  distress. She's alert and oriented x4 and appropriate throughout the examination. Cardiopulmonary assessment is negative for acute distress and she exhibits normal effort. Bilateral breast exam is deferred.    ECOG = ***  0 - Asymptomatic (Fully active, able to carry on all predisease activities without restriction)  1 - Symptomatic but completely ambulatory (Restricted in physically strenuous activity but ambulatory and able to carry out work of a light or sedentary nature. For example, light housework, office work)  2 - Symptomatic, <50% in bed during the day (Ambulatory and capable of all self care but unable to carry out any work activities. Up and about more than 50% of waking hours)  3 - Symptomatic, >50% in bed, but not bedbound (Capable of only limited self-care, confined to bed or chair 50% or more of waking hours)  4 - Bedbound (Completely disabled. Cannot carry on any self-care. Totally confined to bed or chair)  5 - Death   Santiago Glad MM, Creech RH, Tormey DC, et al. [phone]). "Toxicity and response criteria of the [facility]". Am. Evlyn Clines. Oncol. 5 (6): 649-55    LABORATORY DATA:  Lab Results  Component Value Date   WBC 4.6 11/19/2022   HGB 13.6 11/19/2022   HCT 41.3 11/19/2022   MCV 86 11/19/2022   PLT 186 11/19/2022   Lab Results  Component Value Date   NA 141 07/12/2023   K 4.3 07/12/2023   CL 104 07/12/2023   CO2 24 07/12/2023   Lab Results  Component Value Date   ALT 15 07/12/2023   AST 21 07/12/2023   ALKPHOS 116 07/12/2023   BILITOT 0.6 07/12/2023      RADIOGRAPHY: MM LT BREAST BX W LOC DEV 1ST LESION IMAGE BX SPEC STEREO GUIDE Addendum Date: 12/24/2023 ADDENDUM REPORT: 12/24/2023 10:37 ADDENDUM: Pathology revealed DUCTAL CARCINOMA IN SITU, INTERMEDIATE GRADE, NECROSIS: PRESENT, CALCIFICATIONS: PRESENT of the LEFT breast, outer, (x clip). This was found to be concordant by Dr. Laveda Abbe. Pathology results were discussed with the  patient by telephone. The patient reported doing well after the biopsy with tenderness at the site. Post biopsy instructions and care were reviewed and questions were answered. The patient was encouraged to call [facility] for any additional concerns. My direct phone number was provided. The patient was referred to [facility] on December 29, 2023. Pathology results reported by Rene Kocher, RN on 12/24/2023. Electronically Signed   By: Harmon Pier M.D.   On: 12/24/2023 10:37   Result Date: 12/24/2023 CLINICAL DATA:  59 year old female presents for stereotactic guided biopsy of anterior OUTER LEFT breast calcifications. EXAM: LEFT BREAST STEREOTACTIC CORE NEEDLE BIOPSY COMPARISON:  Previous exam(s). FINDINGS: The patient and I discussed the procedure of stereotactic-guided biopsy including benefits and alternatives. We discussed the high  likelihood of a successful procedure. We discussed the risks of the procedure including infection, bleeding, tissue injury, clip migration, and inadequate sampling. Informed written consent was given. The usual time out protocol was performed immediately prior to the procedure. Using sterile technique and 1% Lidocaine with and without epinephrine as local anesthetic, under stereotactic guidance, a 9 gauge vacuum assisted device was used to perform core needle biopsy of the 1.2 cm group of OUTER LEFT breast calcifications using a SUPERIOR approach. Specimen radiograph was performed showing calcifications. Specimens with calcifications are identified for pathology. At the conclusion of the procedure, an X shaped tissue marker clip was deployed into the biopsy cavity. Follow-up 2-view mammogram was performed and dictated separately. IMPRESSION: Stereotactic-guided biopsy of 1.2 cm group of OUTER LEFT breast calcifications. No apparent complications. Electronically Signed: By: Harmon Pier M.D. On: 12/22/2023 11:56   MM CLIP PLACEMENT LEFT Result Date: 12/22/2023 CLINICAL DATA:  Evaluate biopsy clip placement following stereotactic guided LEFT breast biopsy. EXAM: 3D DIAGNOSTIC LEFT MAMMOGRAM POST STEREOTACTIC BIOPSY COMPARISON:  Previous exam(s). ACR Breast Density Category c: The breasts are heterogeneously dense, which may obscure small masses. FINDINGS: 3D Mammographic images were obtained following stereotactic guided biopsy of 1.2 cm group of OUTER LEFT breast calcifications. The X biopsy marking clip is in expected position at the site of biopsy. IMPRESSION: Appropriate positioning of the X shaped biopsy marking clip at the site of biopsy in the OUTER LEFT breast. Final Assessment: Post Procedure Mammograms for Marker Placement Electronically Signed   By: Harmon Pier M.D.   On: 12/22/2023 12:00   MM 3D DIAGNOSTIC MAMMOGRAM UNILATERAL LEFT BREAST Result Date: 12/21/2023 CLINICAL DATA:  Screening recall for possible left breast asymmetry as well as left breast calcifications. EXAM: DIGITAL DIAGNOSTIC UNILATERAL LEFT MAMMOGRAM WITH TOMOSYNTHESIS AND CAD TECHNIQUE: Left digital diagnostic mammography and breast tomosynthesis was performed. The images were evaluated with computer-aided detection. COMPARISON:  Previous exam(s). ACR Breast Density Category c: The breasts are heterogeneously dense, which may obscure small masses. FINDINGS: Additional tomosynthesis images were performed of the left. The initially questioned possible left breast asymmetry resolves on the additional imaging with findings compatible with an area of overlapping fibroglandular tissue. Spot compression magnification views of the lower outer left breast demonstrate a new 1.2 cm group of linear oriented calcifications, not definitely vascular. IMPRESSION: Indeterminate 1.2 cm group of calcifications in the lower outer left breast. RECOMMENDATION: Recommend stereotactic guided core biopsy of the calcifications in the  slightly lower outer left breast. I have discussed the findings and recommendations with the patient. If applicable, a reminder letter will be sent to the patient regarding the next appointment. BI-RADS CATEGORY  4: Suspicious. Electronically Signed   By: Edwin Cap M.D.   On: 12/21/2023 09:25   MM 3D SCREENING MAMMOGRAM BILATERAL BREAST Result Date: 12/15/2023 CLINICAL DATA:  Screening. EXAM: DIGITAL SCREENING BILATERAL MAMMOGRAM WITH TOMOSYNTHESIS AND CAD TECHNIQUE: Bilateral screening digital craniocaudal and mediolateral oblique mammograms were obtained. Bilateral screening digital breast tomosynthesis was performed. The images were evaluated with computer-aided detection. COMPARISON:  Previous exam(s). ACR Breast Density Category c: The breasts are heterogeneously dense, which may obscure small masses. FINDINGS: In the left breast, a possible asymmetry and separate calcifications warrant further evaluation. In the right breast, no findings suspicious for malignancy. IMPRESSION: Further evaluation is suggested for possible asymmetry and separate calcifications in the left breast. RECOMMENDATION: Diagnostic mammogram and possibly ultrasound of the left breast. (Code:[id number]) The patient will be contacted regarding the findings, and additional  imaging will be scheduled. BI-RADS CATEGORY  0: Incomplete: Need additional imaging evaluation. Electronically Signed   By: Meda Klinefelter M.D.   On: 12/15/2023 16:22       IMPRESSION/PLAN: 1. Intermediate Grade, ER positive DCIS of the left breast. Dr. Mitzi Hansen discusses the pathology findings and reviews the nature of noninvasive  breast disease. The consensus from the breast conference includes breast conservation with lumpectomy. Dr. Mitzi Hansen discusses the rationale for external radiotherapy to the breast  to reduce risks of local recurrence. Dr. Al Pimple anticipates adjuvant antiestrogen therapy to follow. We discussed the risks, benefits, short, and long term  effects of radiotherapy, as well as the curative intent, and the patient is interested in proceeding. Dr. Mitzi Hansen discusses the delivery and logistics of radiotherapy and anticipates a course of 4 weeks of radiotherapy to the left breast with deep inspiration breath hold technique. We will see her back a few weeks after surgery to discuss the simulation process and anticipate we starting radiotherapy about 4-6 weeks after surgery.   2. Possible genetic predisposition to malignancy. The patient is a candidate to meet with our geneticist today in clinic. We will follow along with any testing results if she elects to pursue testing.    In a visit lasting *** minutes, greater than 50% of the time was spent face to face reviewing her case, as well as in preparation of, discussing, and coordinating the patient's care.  The above documentation reflects my direct findings during this shared patient visit. Please see the separate note by Dr. Mitzi Hansen on this date for the remainder of the patient's plan of care.    Osker Mason, [facility]    **Disclaimer: This note was dictated with voice recognition software. Similar sounding words can inadvertently be transcribed and this note may contain transcription errors which may not have been corrected upon publication of note.**

## 2023-12-28 ENCOUNTER — Encounter: Payer: Self-pay | Admitting: *Deleted

## 2023-12-28 DIAGNOSIS — D0512 Intraductal carcinoma in situ of left breast: Secondary | ICD-10-CM

## 2023-12-28 NOTE — Assessment & Plan Note (Signed)
 This is a 58 yr old post menopausal female patient with newly diagnosed ER positive, PR positive DCIS referred to breast [facility] for additional recommendations.  Ductal Carcinoma In Situ (DCIS) of the left breast Diagnosed with 12 mm DCIS, stage 0, estrogen receptor-positive. Chemotherapy not indicated. Post-radiation, anti-estrogen therapy will be based on menopausal status. - Perform lumpectomy on the left breast. - Administer radiation therapy post-lumpectomy. - Conduct blood work to determine menopausal status. - Initiate anti-estrogen therapy post-radiation based on menopausal status. - Discussed potential anti-estrogen therapies: tamoxifen or aromatase inhibitors. - Educated about side effects of tamoxifen (hot flashes, vaginal discharge, risk of blood clots) and aromatase inhibitors (hot flashes, vaginal dryness, bone density loss).  Hypertension On medication.  Hyperlipidemia On medication.  Meniscus tear Underwent surgical repair in February, currently non-weight bearing, in physical therapy. - Continue physical therapy for knee rehabilitation.  Follow-up Follow-up post-radiation to initiate anti-estrogen therapy. - Schedule follow-up appointment post-radiation therapy to initiate anti-estrogen therapy. - Order labs to assess menopausal status.

## 2023-12-28 NOTE — Progress Notes (Unsigned)
 [facility] CONSULT NOTE  Patient Care Team: Kerri Perches, MD as PCP - Denyse Amass, Margretta Ditty, RN as Oncology Nurse Navigator Donnelly Angelica, RN as Oncology Nurse Navigator Almond Lint, MD as Consulting Physician (General Surgery) Rachel Moulds, MD as Consulting Physician (Hematology and Oncology) Dorothy Puffer, MD as Consulting Physician (Radiation Oncology)  CHIEF COMPLAINTS/PURPOSE OF CONSULTATION:  Newly diagnosed breast cancer  HISTORY OF PRESENTING ILLNESS:  Cheyenne Harris 58 y.o. female is here because of recent diagnosis of left breast DCIS   I reviewed her records extensively and collaborated the history with the patient.  SUMMARY OF ONCOLOGIC HISTORY: Oncology History  Ductal carcinoma in situ (DCIS) of left breast  12/13/2023 Mammogram   Mammogram showed possible asymmetry and separate calcifications in the left breast. Diagnostic mammogram showed indeterminate 1.2 cm group of calcs in the lower outer left breast.   12/22/2023 Pathology Results   Left breast needle core biopsy showed 1.2 cms group of outer calcs, DCIS, intermediate grade, ER 100%, PR 0%   12/27/2023 Initial Diagnosis   Ductal carcinoma in situ (DCIS) of left breast    Discussed the use of AI scribe software for clinical note transcription with the patient, who gave verbal consent to proceed.  History of Present Illness    Cheyenne Harris is a 58 year old female who presents for consultation regarding treatment options for ductal carcinoma in situ (DCIS).  She was diagnosed with ductal carcinoma in situ (DCIS) of the left breast after a mammogram revealed a cluster of calcifications in the lower outer quadrant, measuring 12 millimeters. A biopsy confirmed estrogen receptor-positive DCIS. She is seeking consultation for treatment options.  She has a history of meniscus repair surgery in February following a tear while running. She is primarily a walker but attempted running on  the day of the injury. Currently, she is non-weight bearing on the affected knee and undergoing physical therapy, anticipating removal of the brace soon.  Her past medical history includes hypertension and hyperlipidemia, for which she is on medication. She has undergone a partial hysterectomy, including removal of the cervix and fallopian tubes, but retains her ovaries. She experiences hot flashes and is uncertain if she has gone through menopause.  Family history is significant for breast cancer on her father's side and kidney cancer in her mother's side, who was diagnosed in her late 47.  MEDICAL HISTORY:  Past Medical History:  Diagnosis Date   Complication of anesthesia    Hyperlipidemia    Hypertension    PONV (postoperative nausea and vomiting)     SURGICAL HISTORY: Past Surgical History:  Procedure Laterality Date   ABDOMINAL HYSTERECTOMY     BILATERAL SALPINGECTOMY Bilateral 03/12/2015   Procedure: BILATERAL SALPINGECTOMY;  Surgeon: Tilda Burrow, MD;  Location: [facility];  Service: Gynecology;  Laterality: Bilateral;   BREAST BIOPSY Left 12/22/2023   MM LT BREAST BX W LOC DEV 1ST LESION IMAGE BX SPEC STEREO GUIDE 12/22/2023 [facility]AMMOGRAP[facility]   COLONOSCOPY N/A 04/13/2018   Procedure: COLONOSCOPY;  Surgeon: Malissa Hippo, MD;  [facility];  Service: Endoscopy;  Laterality: N/A;  730   MENISCUS REPAIR Right    SUPRACERVICAL ABDOMINAL HYSTERECTOMY N/A 03/12/2015   Procedure: HYSTERECTOMY SUPRACERVICAL ABDOMINAL;  Surgeon: Tilda Burrow, MD;  Location: [facility];  Service: Gynecology;  Laterality: N/A;   WISDOM TOOTH EXTRACTION      SOCIAL HISTORY: Social History   Socioeconomic History   Marital status: Married  Spouse name: Not on file   Number of children: Not on file   Years of education: Not on file   Highest education level: Not on file  Occupational History   Not on file  Tobacco Use   Smoking status: Never   Smokeless tobacco: Never   Substance and Sexual Activity   Alcohol use: Yes    Alcohol/week: 0.0 standard drinks of alcohol    Comment: occasionally   Drug use: No   Sexual activity: Yes    Birth control/protection: Surgical  Other Topics Concern   Not on file  Social History Narrative   Not on file   Social Drivers of Health   Financial Resource Strain: Low Risk  (12/16/2021)   Overall Financial Resource Strain (CARDIA)    Difficulty of Paying Living Expenses: Not hard at all  Food Insecurity: No Food Insecurity (12/29/2023)   Hunger Vital Sign    Worried About Running Out of Food in the Last Year: Never true    Ran Out of Food in the Last Year: Never true  Transportation Needs: No Transportation Needs (12/29/2023)   PRAPARE - Administrator, Civil Service (Medical): No    Lack of Transportation (Non-Medical): No  Physical Activity: Sufficiently Active (12/16/2021)   Exercise Vital Sign    Days of Exercise per Week: 5 days    Minutes of Exercise per Session: 40 min  Stress: No Stress Concern Present (12/16/2021)   [employer] of Occupational Health - Occupational Stress Questionnaire    Feeling of Stress : Only a little  Social Connections: Socially Integrated (12/16/2021)   Social Connection and Isolation Panel [NHANES]    Frequency of Communication with Friends and Family: More than three times a week    Frequency of Social Gatherings with Friends and Family: Once a week    Attends Religious Services: More than 4 times per year    Active Member of [employer] or Organizations: Yes    Attends Banker Meetings: 1 to 4 times per year    Marital Status: Married  Catering manager Violence: Not At Risk (12/29/2023)   Humiliation, Afraid, Rape, and Kick questionnaire    Fear of Current or Ex-Partner: No    Emotionally Abused: No    Physically Abused: No    Sexually Abused: No    FAMILY HISTORY: Family History  Problem Relation Age of Onset   Hypertension Mother    Hypertension  Sister    Hypertension Sister    Hypertension Brother    Diabetes Maternal Grandmother    Heart disease Maternal Grandmother    Breast cancer Paternal Aunt     ALLERGIES:  is allergic to sulfa antibiotics.  MEDICATIONS:  Current Outpatient Medications  Medication Sig Dispense Refill   amLODipine (NORVASC) 2.5 MG tablet Take 1 tablet (2.5 mg total) by mouth daily. 90 tablet 3   amLODipine (NORVASC) 5 MG tablet TAKE 1 TABLET (5 MG TOTAL) BY MOUTH DAILY. 90 tablet 3   aspirin EC 81 MG tablet Take 81 mg by mouth daily. Swallow whole.     cetirizine (ZYRTEC) 10 MG tablet Take 10 mg by mouth daily.     Multiple Vitamins-Minerals (MULTIVITAMIN WITH MINERALS) tablet Take 1 tablet by mouth daily.     polyethylene glycol powder (GLYCOLAX/MIRALAX) 17 GM/SCOOP powder Take 17 g by mouth in the morning and at bedtime. 510 g 5   rosuvastatin (CRESTOR) 5 MG tablet TAKE 1 TABLET (5 MG TOTAL) BY MOUTH DAILY. 90  tablet 1   phentermine 15 MG capsule Take 1 capsule (15 mg total) by mouth every morning. 30 capsule 2   No current facility-administered medications for this visit.   All other systems were reviewed with the patient and are negative.  PHYSICAL EXAMINATION: ECOG PERFORMANCE STATUS: 0 - Asymptomatic  Vitals:   12/29/23 0839  BP: 139/71  Pulse: 80  Resp: 16  Temp: 97.8 F (36.6 C)  SpO2: 100%   Filed Weights   12/29/23 0839  Weight: 150 lb 6.4 oz (68.2 kg)    GENERAL:alert, no distress and comfortable BREAST: Post biopsy changes noted today.No palpable nodules in breast. No palpable axillary or supraclavicular lymphadenopathy  Right leg meniscal injury   LABORATORY DATA:  I have reviewed the data as listed Lab Results  Component Value Date   WBC 6.0 12/29/2023   HGB 14.1 12/29/2023   HCT 42.7 12/29/2023   MCV 87.0 12/29/2023   PLT 209 12/29/2023   Lab Results  Component Value Date   NA 141 12/29/2023   K 4.1 12/29/2023   CL 105 12/29/2023   CO2 28 12/29/2023     RADIOGRAPHIC STUDIES: I have personally reviewed the radiological reports and agreed with the findings in the report.  ASSESSMENT AND PLAN:  Ductal carcinoma in situ (DCIS) of left breast This is a 58 yr old post menopausal female patient with newly diagnosed ER positive, PR positive DCIS referred to breast [facility] for additional recommendations.  Ductal Carcinoma In Situ (DCIS) of the left breast Diagnosed with 12 mm DCIS, stage 0, estrogen receptor-positive. Chemotherapy not indicated. Post-radiation, anti-estrogen therapy will be based on menopausal status. - Perform lumpectomy on the left breast. - Administer radiation therapy post-lumpectomy. - Conduct blood work to determine menopausal status. - Initiate anti-estrogen therapy post-radiation based on menopausal status. - Discussed potential anti-estrogen therapies: tamoxifen or aromatase inhibitors. - Educated about side effects of tamoxifen (hot flashes, vaginal discharge, risk of blood clots) and aromatase inhibitors (hot flashes, vaginal dryness, bone density loss).  Hypertension On medication.  Hyperlipidemia On medication.  Meniscus tear Underwent surgical repair in February, currently non-weight bearing, in physical therapy. - Continue physical therapy for knee rehabilitation.  Follow-up Follow-up post-radiation to initiate anti-estrogen therapy. - Schedule follow-up appointment post-radiation therapy to initiate anti-estrogen therapy. - Order labs to assess menopausal status.   All questions were answered. The patient knows to call the clinic with any problems, questions or concerns.    Rachel Moulds, MD 12/29/23

## 2023-12-29 ENCOUNTER — Inpatient Hospital Stay: Admitting: Licensed Clinical Social Worker

## 2023-12-29 ENCOUNTER — Encounter: Payer: Self-pay | Admitting: *Deleted

## 2023-12-29 ENCOUNTER — Inpatient Hospital Stay (HOSPITAL_BASED_OUTPATIENT_CLINIC_OR_DEPARTMENT_OTHER): Admitting: Genetic Counselor

## 2023-12-29 ENCOUNTER — Ambulatory Visit
Admission: RE | Admit: 2023-12-29 | Discharge: 2023-12-29 | Disposition: A | Discharge status: Home / Self Care | Source: Ambulatory Visit | Attending: Radiation Oncology | Admitting: Radiation Oncology

## 2023-12-29 ENCOUNTER — Other Ambulatory Visit: Payer: Self-pay | Admitting: General Surgery

## 2023-12-29 ENCOUNTER — Ambulatory Visit: Admitting: Physical Therapy

## 2023-12-29 ENCOUNTER — Inpatient Hospital Stay: Attending: Hematology and Oncology | Admitting: Hematology and Oncology

## 2023-12-29 ENCOUNTER — Inpatient Hospital Stay

## 2023-12-29 ENCOUNTER — Encounter: Payer: Self-pay | Admitting: Radiation Oncology

## 2023-12-29 ENCOUNTER — Encounter: Payer: Self-pay | Admitting: Genetic Counselor

## 2023-12-29 VITALS — BP 139/71 | HR 80 | Temp 97.8°F | Resp 16 | Ht 64.96 in | Wt 150.4 lb

## 2023-12-29 DIAGNOSIS — Z8249 Family history of ischemic heart disease and other diseases of the circulatory system: Secondary | ICD-10-CM | POA: Insufficient documentation

## 2023-12-29 DIAGNOSIS — Z833 Family history of diabetes mellitus: Secondary | ICD-10-CM | POA: Diagnosis not present

## 2023-12-29 DIAGNOSIS — D0512 Intraductal carcinoma in situ of left breast: Secondary | ICD-10-CM | POA: Insufficient documentation

## 2023-12-29 DIAGNOSIS — R232 Flushing: Secondary | ICD-10-CM | POA: Insufficient documentation

## 2023-12-29 DIAGNOSIS — Z90710 Acquired absence of both cervix and uterus: Secondary | ICD-10-CM | POA: Diagnosis not present

## 2023-12-29 DIAGNOSIS — Z7289 Other problems related to lifestyle: Secondary | ICD-10-CM | POA: Diagnosis not present

## 2023-12-29 DIAGNOSIS — Z90722 Acquired absence of ovaries, bilateral: Secondary | ICD-10-CM | POA: Diagnosis not present

## 2023-12-29 DIAGNOSIS — Z803 Family history of malignant neoplasm of breast: Secondary | ICD-10-CM

## 2023-12-29 DIAGNOSIS — Z79899 Other long term (current) drug therapy: Secondary | ICD-10-CM | POA: Insufficient documentation

## 2023-12-29 DIAGNOSIS — E785 Hyperlipidemia, unspecified: Secondary | ICD-10-CM | POA: Diagnosis not present

## 2023-12-29 DIAGNOSIS — Z882 Allergy status to sulfonamides status: Secondary | ICD-10-CM | POA: Insufficient documentation

## 2023-12-29 DIAGNOSIS — Z9079 Acquired absence of other genital organ(s): Secondary | ICD-10-CM | POA: Insufficient documentation

## 2023-12-29 DIAGNOSIS — I10 Essential (primary) hypertension: Secondary | ICD-10-CM | POA: Diagnosis not present

## 2023-12-29 DIAGNOSIS — Z8051 Family history of malignant neoplasm of kidney: Secondary | ICD-10-CM | POA: Diagnosis not present

## 2023-12-29 DIAGNOSIS — Z170 Estrogen receptor positive status [ER+]: Secondary | ICD-10-CM

## 2023-12-29 LAB — CBC WITH DIFFERENTIAL (CANCER CENTER ONLY)
Abs Immature Granulocytes: 0.01 10*3/uL (ref 0.00–0.07)
Basophils Absolute: 0 10*3/uL (ref 0.0–0.1)
Basophils Relative: 1 %
Eosinophils Absolute: 0 10*3/uL (ref 0.0–0.5)
Eosinophils Relative: 1 %
HCT: 42.7 % (ref 36.0–46.0)
Hemoglobin: 14.1 g/dL (ref 12.0–15.0)
Immature Granulocytes: 0 %
Lymphocytes Relative: 37 %
Lymphs Abs: 2.2 10*3/uL (ref 0.7–4.0)
MCH: 28.7 pg (ref 26.0–34.0)
MCHC: 33 g/dL (ref 30.0–36.0)
MCV: 87 fL (ref 80.0–100.0)
Monocytes Absolute: 0.3 10*3/uL (ref 0.1–1.0)
Monocytes Relative: 5 %
Neutro Abs: 3.4 10*3/uL (ref 1.7–7.7)
Neutrophils Relative %: 56 %
Platelet Count: 209 10*3/uL (ref 150–400)
RBC: 4.91 MIL/uL (ref 3.87–5.11)
RDW: 13.1 % (ref 11.5–15.5)
WBC Count: 6 10*3/uL (ref 4.0–10.5)
nRBC: 0 % (ref 0.0–0.2)

## 2023-12-29 LAB — CMP (CANCER CENTER ONLY)
ALT: 10 U/L (ref 0–44)
AST: 15 U/L (ref 15–41)
Albumin: 4.5 g/dL (ref 3.5–5.0)
Alkaline Phosphatase: 106 U/L (ref 38–126)
Anion gap: 8 (ref 5–15)
BUN: 17 mg/dL (ref 6–20)
CO2: 28 mmol/L (ref 22–32)
Calcium: 9.8 mg/dL (ref 8.9–10.3)
Chloride: 105 mmol/L (ref 98–111)
Creatinine: 0.78 mg/dL (ref 0.44–1.00)
GFR, Estimated: >60 mL/min (ref >60)
Glucose, Bld: 91 mg/dL (ref 70–99)
Potassium: 4.1 mmol/L (ref 3.5–5.1)
Sodium: 141 mmol/L (ref 135–145)
Total Bilirubin: 0.5 mg/dL (ref 0.0–1.2)
Total Protein: 7.8 g/dL (ref 6.5–8.1)

## 2023-12-29 LAB — GENETIC SCREENING ORDER

## 2023-12-29 NOTE — Progress Notes (Signed)
 [facility] Clinical Social Work  Initial Assessment   Cheyenne Harris is a 58 y.o. year old female accompanied by mother and husband. Clinical Social Work was referred by  [facility]  for assessment of psychosocial needs.   SDOH (Social Determinants of Health) assessments performed: Yes SDOH Interventions    Flowsheet Row Clinical Support from 12/29/2023 in [facility] - A Dept Of [place] Office Visit from 12/16/2021 in [facility] at [facility]  SDOH Interventions    Food Insecurity Interventions Intervention Not Indicated Intervention Not Indicated  Housing Interventions Intervention Not Indicated Intervention Not Indicated  Transportation Interventions Intervention Not Indicated Intervention Not Indicated  Utilities Interventions Intervention Not Indicated --  Financial Strain Interventions -- Intervention Not Indicated  Physical Activity Interventions -- Intervention Not Indicated  Stress Interventions -- Intervention Not Indicated  Social Connections Interventions -- Intervention Not Indicated       SDOH Screenings   Food Insecurity: No Food Insecurity (12/29/2023)  Housing: Low Risk  (12/29/2023)  Transportation Needs: No Transportation Needs (12/29/2023)  Utilities: Not At Risk (12/29/2023)  Alcohol Screen: Low Risk  (12/16/2021)  Depression (PHQ2-9): Low Risk  (07/06/2023)  Financial Resource Strain: Low Risk  (12/16/2021)  Physical Activity: Sufficiently Active (12/16/2021)  Social Connections: Socially Integrated (12/16/2021)  Stress: No Stress Concern Present (12/16/2021)  Tobacco Use: Low Risk  (07/07/2023)     Distress Screen completed: No     No data to display            Family/Social Information:  Housing Arrangement: patient lives with husband Family members/support persons in your life? 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 Currently on leave for her leg Income source: Employment Financial concerns: No Type of concern: None Food access concerns: no Religious or spiritual practice: Not known Advanced directives: No Services Currently in place:  [facility] & [facility]  Coping/ Adjustment to diagnosis: Patient understands treatment plan and what happens next? yes, feeling calm and confident after meeting with medical team Patient reported stressors: Adjusting to my illness Patient enjoys being outside Current coping skills/ strengths: Ability for insight , Capable of independent living , Communication skills , Motivation for treatment/growth , and Supportive family/friends     SUMMARY: Current SDOH Barriers:  No major barriers identified today  Clinical Social Work Clinical Goal(s):  No clinical social work goals at this time  Interventions: Discussed common feeling and emotions when being diagnosed with cancer, and the importance of support during treatment Informed patient of the support team roles and support services at [facility] Provided CSW contact information and encouraged patient to call with any questions or concerns   Follow Up Plan: Patient will contact CSW with any support or resource needs Patient verbalizes understanding of plan: Yes    Zophia Marrone E Kamdon Reisig, LCSW Clinical Social Worker [facility]

## 2023-12-29 NOTE — Progress Notes (Signed)
 REFERRING PROVIDER: Rachel Moulds, MD [address] [place],  [place] [address]  PRIMARY PROVIDER:  Kerri Perches, MD  PRIMARY REASON FOR VISIT:  1. Ductal carcinoma in situ (DCIS) of left breast   2. Family history of breast cancer     HISTORY OF PRESENT ILLNESS:   Cheyenne Harris, a 58 y.o. female, was seen for a [facility] cancer genetics consultation during the breast multidisciplinary clinic at the request of Dr. Al Pimple due to a personal and family history of breast cancer.  Cheyenne Harris presents to clinic today to discuss the possibility of a hereditary predisposition to cancer, to discuss genetic testing, and to further clarify her future cancer risks, as well as potential cancer risks for family members.   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 The treatment plan is pending.   CANCER HISTORY:  Oncology History  Ductal carcinoma in situ (DCIS) of left breast  12/13/2023 Mammogram   Mammogram showed possible asymmetry and separate calcifications in the left breast. Diagnostic mammogram showed indeterminate 1.2 cm group of calcs in the lower outer left breast.   12/22/2023 Pathology Results   Left breast needle core biopsy showed 1.2 cms group of outer calcs, DCIS, intermediate grade, ER 100%, PR 0%   12/27/2023 Initial Diagnosis   Ductal carcinoma in situ (DCIS) of left breast      Past Medical History:  Diagnosis Date   Complication of anesthesia    Hyperlipidemia    Hypertension    PONV (postoperative nausea and vomiting)     Past Surgical History:  Procedure Laterality Date   ABDOMINAL HYSTERECTOMY     BILATERAL SALPINGECTOMY Bilateral 03/12/2015   Procedure: BILATERAL SALPINGECTOMY;  Surgeon: Tilda Burrow, MD;  Location: [facility];  Service: Gynecology;  Laterality: Bilateral;   BREAST BIOPSY Left 12/22/2023   MM LT BREAST BX W LOC DEV 1ST LESION IMAGE BX SPEC STEREO GUIDE 12/22/2023 [facility]  MAMMOGRAPHY   COLONOSCOPY N/A 04/13/2018   Procedure: COLONOSCOPY;  Surgeon: Malissa Hippo, MD;  Location: [facility] ENDO SUITE;  Service: Endoscopy;  Laterality: N/A;  730   MENISCUS REPAIR Right    SUPRACERVICAL ABDOMINAL HYSTERECTOMY N/A 03/12/2015   Procedure: HYSTERECTOMY SUPRACERVICAL ABDOMINAL;  Surgeon: Tilda Burrow, MD;  Location: [facility] ORS;  Service: Gynecology;  Laterality: N/A;   WISDOM TOOTH EXTRACTION       FAMILY HISTORY:  We obtained a detailed, 4-generation family history.  Significant diagnoses are listed below: Family History  Problem Relation Age of Onset   Throat cancer Father        d. 75   Breast cancer Paternal Aunt        x3 pat aunts; some dx before age 33   Kidney cancer Maternal Grandmother 18   Breast cancer Other        distant maternal cousin w/ breast cancer dx <50     Cheyenne Harris is unaware of previous family history of genetic testing for hereditary cancer risks.  Other relatives are unavailable for genetic testing at this time.   There is no reported Ashkenazi Jewish ancestry. There is no known consanguinity.  GENETIC COUNSELING ASSESSMENT: Cheyenne Harris is a 58 y.o. female with a personal and family history of cancer which is somewhat suggestive of a hereditary cancer syndrome and predisposition to cancer given the presence of related cancers in the family. We, therefore, discussed and recommended the following at  today's visit.   DISCUSSION: We discussed that 5 - 10% of cancer is hereditary, with most cases of hereditary breast cancer associated with mutations in BRCA1/2.  There are other genes that can be associated with hereditary breast  cancer syndromes.  Type of cancer risk and level of risk are gene-specific. We discussed that testing is beneficial for several reasons including knowing how to follow individuals after completing their treatment, identifying whether potential treatment/surgery options would be beneficial, and understanding if other  family members could be at risk for cancer and allowing them to undergo genetic testing.   We reviewed the characteristics, features and inheritance patterns of hereditary cancer syndromes. We also discussed genetic testing, including the appropriate family members to test, the process of testing, insurance coverage and turn-around-time for results. We discussed the implications of a negative, positive and/or variant of uncertain significant result. In order to get genetic test results in a timely manner so that Cheyenne Harris can use these genetic test results for surgical decisions, we recommended Cheyenne Harris pursue genetic testing for the [employer]  The BRCAPlus gene panel offered by [facility] and includes sequencing and rearrangement analysis for the following 13 genes: ATM, BARD1, BRCA1, BRCA2, CDH1, CHEK2, NF1, PALB2, PTEN, RAD51C, RAD51D, STK11 and TP53. Once complete, we recommend Cheyenne Harris pursue reflex genetic testing to a more comprehensive gene panel.   Cheyenne Harris  was offered a common hereditary cancer panel (~40 genes) and an expanded pan-cancer panel (~70 genes). Cheyenne Harris was informed of the benefits and limitations of each panel, including that expanded pan-cancer panels contain genes that do not have clear management guidelines at this point in time.  We also discussed that as the number of genes included on a panel increases, the chances of variants of uncertain significance increases.  After considering the benefits and limitations of each gene panel, Cheyenne Harris  elected to have a common hereditary cancers panel.   The [facility] CustomNext-Cancer +RNAinsight Panel (CancerNext + kidney cancer genes) includes sequencing, deletion/duplication, and RNA analysis for the following 44 genes: APC, ATM, AXIN2, BAP1, BARD1, BMPR1A, BRCA1, BRCA2, BRIP1, CDH1, CDKN2A, CHEK2, EPCAM, FH, FLCN, GREM1, HOXB13, MAX, MBD4, MET, MLH1, MSH2, MSH3, MSH6, MUTYH, NF1, NTHL1, PALB2, PMS2, POLD1, POLE,  PTEN, RAD51C, RAD51D, SDHA, SDHB, SDHC, SDHD, SMAD4, STK11, TP53, TSC1, TSC2, VHL.   Based on Cheyenne Harris's personal history of breast cancer and family history of breast cancer in three paternal aunts, she meets NCCN medical criteria for genetic testing.  She is the most informative relative available for genetic testing. Despite that she meets criteria, she may still have an out of pocket cost. We discussed that if her out of pocket cost for testing is over $100, the laboratory should contact them to discuss self-pay prices, patient pay assistance programs, if applicable, and other billing options.   We discussed the Genetic Information Non-Discrimination Act (GINA) of 2008, which helps protect individuals against genetic discrimination based on their genetic test results.  It impacts both health insurance and employment.  With health insurance, it protects against genetic test results being used for increased premiums or policy termination. For employment, it protects against hiring, firing and promoting decisions based on genetic test results.  GINA does not apply to those in the [employer], those who work for companies with less than 15 employees, and new life insurance or long-term disability insurance policies.  Health status due to a cancer diagnosis is not protected under GINA.  PLAN: After considering  the risks, benefits, and limitations, Cheyenne Harris provided informed consent to pursue genetic testing and the blood sample was sent to [facility] for analysis of the BRCAPlus and CustomNext-Cancer +RNAinsight Panel. Results should be available within approximately 1-2 weeks' time, at which point they will be disclosed by telephone to Cheyenne Harris, as will any additional recommendations warranted by these results. Cheyenne Harris will receive a summary of her genetic counseling visit and a copy of her results once available. This information will also be available in Epic.   Cheyenne Harris questions were  answered to her satisfaction today. Our contact information was provided should additional questions or concerns arise. Thank you for the referral and allowing [place] to share in the care of your patient.   Jerald Hennington M. Rennie Plowman, MS, [facility] Genetic Counselor [email or web address] (P) [phone]    30 minutes were spent on the date of the encounter in service to the patient including preparation, face-to-face consultation, documentation and care coordination.  The patient was accompanied by her mother and husband.  Dr. Al Pimple was available to discuss this case as needed.   _______________________________________________________________________ For Office Staff:  Number of people involved in session: 3 Was an Intern/ student involved with case: no

## 2023-12-30 ENCOUNTER — Other Ambulatory Visit: Payer: Self-pay | Admitting: General Surgery

## 2023-12-30 ENCOUNTER — Other Ambulatory Visit: Payer: Self-pay | Admitting: Family Medicine

## 2023-12-30 DIAGNOSIS — Z170 Estrogen receptor positive status [ER+]: Secondary | ICD-10-CM

## 2023-12-30 LAB — FOLLICLE STIMULATING HORMONE: FSH: 57.7 m[IU]/mL

## 2023-12-30 LAB — ESTRADIOL: Estradiol: 8 pg/mL

## 2023-12-30 LAB — LUTEINIZING HORMONE: LH: 29.7 m[IU]/mL

## 2023-12-31 ENCOUNTER — Other Ambulatory Visit: Payer: Self-pay

## 2023-12-31 ENCOUNTER — Encounter (HOSPITAL_BASED_OUTPATIENT_CLINIC_OR_DEPARTMENT_OTHER)
Admission: RE | Admit: 2023-12-31 | Discharge: 2023-12-31 | Disposition: A | Discharge status: Home / Self Care | Source: Ambulatory Visit | Attending: General Surgery | Admitting: General Surgery

## 2023-12-31 ENCOUNTER — Other Ambulatory Visit: Payer: Self-pay | Admitting: *Deleted

## 2023-12-31 DIAGNOSIS — Z01810 Encounter for preprocedural cardiovascular examination: Secondary | ICD-10-CM | POA: Diagnosis present

## 2023-12-31 DIAGNOSIS — D0512 Intraductal carcinoma in situ of left breast: Secondary | ICD-10-CM

## 2023-12-31 DIAGNOSIS — I10 Essential (primary) hypertension: Secondary | ICD-10-CM | POA: Diagnosis not present

## 2023-12-31 MED ORDER — CHLORHEXIDINE GLUCONATE CLOTH 2 % EX PADS: 6.0000 | MEDICATED_PAD | Freq: Once | CUTANEOUS | Status: DC

## 2023-12-31 NOTE — Progress Notes (Signed)

## 2024-01-03 ENCOUNTER — Telehealth: Payer: Self-pay | Admitting: Radiation Oncology

## 2024-01-03 NOTE — H&P (Signed)
 REFERRING PHYSICIAN:  Hu   PROVIDER:  Matthias Hughs, MD   Care Team: Patient Care Team: Kerri Perches, MD as PCP - General (Family Medicine) Matthias Hughs, MD as Consulting Provider (Surgical Oncology) Iruku, Shawn Stall, MD (Hematology and Oncology) Jonna Coup, MD (Radiation Oncology)    MRN: [id number] DOB: [birth year] DATE OF ENCOUNTER: 12/29/2023   Subjective    Chief Complaint: Breast Cancer       History of Present Illness: Cheyenne Harris is a 58 y.o. female who is seen today as an office consultation at the request of Dr. Al Pimple for evaluation of Breast Cancer .   Patient has a new diagnosis of left breast cancer March 2025.  The patient presented with a screening detected calcs and asymmetry.  Diagnostic imaging showed a 1.2 cm group of linear calcifications in the lower outer quadrant.  A core needle biopsy was performed.  This showed intermediate grade DCIS with necrosis and calcifications.  Prognostic panel was strong ER positivity and negative PR status. History of Present Illness The patient reports a meniscus injury on the right due to injury. She was trying to run when the meniscus injury occurred. The patient is currently not working due to the injury. The patient also mentions having to deal with the breast cancer diagnosis, which was detected through a mammogram. The patient is aware of the non-invasive nature of the cancer and understands the high survival rate associated with this stage. 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 Pt works at English as a second language teacher.       Family cancer history -patient has a history of 3 paternal aunts with breast cancer.  Her father had throat cancer.  One of the paternal aunts with breast cancer also had a cancer in her lung.   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   Diagnostic mammogram/ultrasound: BCG 12/21/23 ACR Breast Density Category c: The breasts are heterogeneously dense, which may obscure small masses.  FINDINGS: Additional tomosynthesis images were performed of the left. The initially questioned possible left breast asymmetry resolves on the additional imaging with findings compatible with an area of overlapping fibroglandular tissue. Spot compression magnification views of the lower outer left breast demonstrate a new 1.2 cm group of linear oriented calcifications, not definitely vascular.  IMPRESSION: Indeterminate 1.2 cm group of calcifications in the lower outer left breast.  RECOMMENDATION: Recommend stereotactic guided core biopsy of the calcifications in the slightly lower outer left breast.  I have discussed the findings and recommendations with the patient. If applicable, a reminder letter will be sent to the patient regarding the next appointment.  BI-RADS CATEGORY  4: Suspicious.     Pathology core needle biopsy: 12/22/23 1. Breast, left, needle core biopsy, 1.2cm group of outer :       - DUCTAL CARCINOMA IN SITU, INTERMEDIATE GRADE       - NECROSIS: PRESENT       - CALCIFICATIONS: PRESENT       - DCIS LENGTH: 0.3 CM    Receptors: Estrogen Receptor:  100%, POSITIVE, STRONG STAINING INTENSITY  Progesterone Receptor:  0%, NEGATIVE      Review of Systems: A complete review of systems was obtained from the patient.  I have reviewed this information and discussed as appropriate with the patient.  See HPI as well for other ROS.   ROS - sinus issues, hot flashes       Medical History: Past Medical History      Past Medical History:  Diagnosis Date   History of cancer     Hyperlipidemia     Hypertension          Problem List     Patient Active Problem List  Diagnosis   Ductal  carcinoma in situ (DCIS) of left breast   Essential hypertension   Malignant neoplasm of lower-outer quadrant of left breast of female, estrogen receptor positive (CMS/HHS-HCC)        Past Surgical History       Past Surgical History:  Procedure Laterality Date   bilateral salpingectomy Bilateral 03/12/2015   .Left Breast Biopsy Left 12/22/2023   HYSTERECTOMY            Allergies      Allergies  Allergen Reactions   Sulfa (Sulfonamide Antibiotics) Hives        Medications Ordered Prior to Encounter        Current Outpatient Medications on File Prior to Visit  Medication Sig Dispense Refill   phentermine (ADIPEX-P) 15 MG capsule Take 15 mg by mouth every morning       polyethylene glycol (MIRALAX) powder Take 17 g by mouth 2 (two) times daily       rosuvastatin (CRESTOR) 5 MG tablet Take 5 mg by mouth once daily       amLODIPine-atorvastatin (CADUET) 10-10 mg tablet Take 1 tablet by mouth once daily       multivitamin with minerals tablet Take 1 tablet by mouth once daily       ondansetron (ZOFRAN-ODT) 4 MG disintegrating tablet Take 4 mg by mouth every 8 (eight) hours as needed for Vomiting or Nausea        No current facility-administered medications on file prior to visit.        Family History       Family History  Problem Relation Age of Onset   High blood pressure (Hypertension) Mother     Hyperlipidemia (Elevated cholesterol) Mother     High blood pressure (Hypertension) Father     Hyperlipidemia (Elevated cholesterol) Father     Obesity Sister     High blood pressure (Hypertension) Sister     Hyperlipidemia (Elevated cholesterol) Sister     High blood pressure (Hypertension) Brother     Hyperlipidemia (Elevated cholesterol) Brother          Tobacco Use History  Social History       Tobacco Use  Smoking Status Never  Smokeless Tobacco Never        Social History  Social History        Socioeconomic History   Marital status: Married  Tobacco  Use   Smoking status: Never   Smokeless tobacco: Never  Vaping Use   Vaping status: Never Used  Substance and Sexual Activity   Alcohol use: Yes   Drug use: Never    Social Drivers of Acupuncturist Strain: Low Risk  (12/16/2021)    Received from [facility]    Overall Financial Resource Strain (CARDIA)     Difficulty of Paying Living Expenses: Not hard at all  Food Insecurity: No Food Insecurity (12/16/2021)    Received from [facility]  Hunger Vital Sign     Worried About Running Out of Food in the Last Year: Never true     Ran Out of Food in the Last Year: Never true  Transportation Needs: No Transportation Needs (12/16/2021)    Received from [facility] - Transportation     Lack of Transportation (Medical): No     Lack of Transportation (Non-Medical): No  Physical Activity: Sufficiently Active (12/16/2021)    Received from [facility]    Exercise Vital Sign     Days of Exercise per Week: 5 days     Minutes of Exercise per Session: 40 min  Stress: No Stress Concern Present (12/16/2021)    Received from [facility] - Occupational Stress Questionnaire     Feeling of Stress : Only a little  Social Connections: Socially Integrated (12/16/2021)    Received from [facility]    Social Connection and Isolation Panel [NHANES]     Frequency of Communication with Friends and Family: More than three times a week     Frequency of Social Gatherings with Friends and Family: Once a week     Attends Religious Services: More than 4 times per year     Active Member of [employer] or Organizations: Yes     Attends Banker Meetings: 1 to 4 times per year     Marital Status: Married        Objective:         Vitals:    12/29/23 1217  BP: 139/71  Pulse: 80  Resp: 16  Temp: 36.6 C (97.8 F)  Weight: 68.2 kg (150 lb 6.4 oz)  Height: 165.1 cm (5\' 5" )    Body mass index is 25.03 kg/m.     Gen:  No  acute distress.  Well nourished and well groomed.   Neurological: Alert and oriented to person, place, and time. Coordination normal.  Head: Normocephalic and atraumatic.  Eyes: Conjunctivae are normal. Pupils are equal, round, and reactive to light. No scleral icterus.  Neck: Normal range of motion. Neck supple. No tracheal deviation or thyromegaly present.  Cardiovascular: Normal rate, regular rhythm, normal heart sounds and intact distal pulses.  Exam reveals no gallop and no friction rub.  No murmur heard. Breast: Breasts are relatively symmetric in size with mild ptosis.  No palpable lymphadenopathy or palpable mass.  Small hematoma inferior laterally on the left.  No nipple retraction or nipple discharge.  Right breast benign. Respiratory: Effort normal.  No respiratory distress. No chest wall tenderness. Breath sounds normal.  No wheezes, rales or rhonchi.  GI: Soft. Bowel sounds are normal. The abdomen is soft and nontender.  There is no rebound and no guarding.  Musculoskeletal: Normal range of motion. Extremities are nontender.  Lymphadenopathy: No cervical, preauricular, postauricular or axillary adenopathy is present Skin: Skin is warm and dry. No rash noted. No diaphoresis. No erythema. No pallor. No clubbing, cyanosis, or edema.   Psychiatric: Normal mood and affect. Behavior is normal. Judgment and thought content normal.      Labs 12/29/23 CMET, CBC normal   Assessment and Plan:        ICD-10-CM    1. Malignant neoplasm of lower-outer quadrant of left breast of female, estrogen receptor positive (CMS/HHS-HCC)  C50.512      Z17.0           Assessment & Plan Left breast cancer lower outer quadrant DCIS  is noninvasive, stage zero breast cancer with a small lesion. Lumpectomy with radiation offers survival rates equivalent to mastectomy. Informed consent covered risks including wound issues, bleeding, infection, and potential need for additional surgery. Discussed cosmetic  changes and reconstructive options post-radiation. - Schedule seed localized lumpectomy at [facility]. - Prescribe postoperative compression bra from [facility] [place]. - Advise on postoperative care: showering, avoiding swimming, wearing a tight bra for at least a week. - Discuss potential for additional surgery if margins are not clear. - Discuss radiation therapy and anti-hormone treatment post-surgery. - Provide information on potential reconstructive surgery if cosmetic issues arise post-radiation.   Meniscus Tear Meniscus tear managed with physical therapy and brace use. - Continue physical therapy for meniscus tear. - Use brace for support during standing activities.

## 2024-01-03 NOTE — Telephone Encounter (Signed)
 3/31 @ 10:20 am Patient is now scheduled for 3/24 with Laurence Aly, also a copy of patient education document for left breast patients, sent to patient inbasket.

## 2024-01-04 ENCOUNTER — Ambulatory Visit (INDEPENDENT_AMBULATORY_CARE_PROVIDER_SITE_OTHER): Payer: 59 | Admitting: Family Medicine

## 2024-01-04 ENCOUNTER — Encounter: Payer: Self-pay | Admitting: *Deleted

## 2024-01-04 VITALS — BP 123/80 | HR 81 | Resp 16 | Ht 64.0 in | Wt 148.0 lb

## 2024-01-04 DIAGNOSIS — D0512 Intraductal carcinoma in situ of left breast: Secondary | ICD-10-CM | POA: Diagnosis not present

## 2024-01-04 DIAGNOSIS — J309 Allergic rhinitis, unspecified: Secondary | ICD-10-CM | POA: Diagnosis not present

## 2024-01-04 DIAGNOSIS — F409 Phobic anxiety disorder, unspecified: Secondary | ICD-10-CM

## 2024-01-04 DIAGNOSIS — I10 Essential (primary) hypertension: Secondary | ICD-10-CM | POA: Diagnosis not present

## 2024-01-04 DIAGNOSIS — E785 Hyperlipidemia, unspecified: Secondary | ICD-10-CM

## 2024-01-04 DIAGNOSIS — F5105 Insomnia due to other mental disorder: Secondary | ICD-10-CM

## 2024-01-04 MED ORDER — AZELASTINE HCL 0.1 % NA SOLN: 2.0000 | Freq: Two times a day (BID) | NASAL | 12 refills | Status: AC

## 2024-01-04 NOTE — Patient Instructions (Addendum)
 F/U in 4 months, call if you need me sooner  PLEASE reach out if you need any help that I am able to provide  Approach disability as we discussed and let me know if I need to be the "co ordinator" I believe that Oncology will be VERY helpful and the main driver of this, but you do not need to worry , it will be taken care of   New additional med for allergies is ASTELIN NOSE SPRAY, YOU MAY ALSO DOUBLE THE ZYRTEC TABLET  FOR SLEEP USE OTC MELATONIN 1 MG , TAKE UP TO 5 MG DOSE ( 5 TABLETS) AT BEDTIME IF NEEDED FOR SLEEP  All THE BEST   Thanks for choosing [facility], we consider it a privelige to serve you.

## 2024-01-05 ENCOUNTER — Ambulatory Visit
Admission: RE | Admit: 2024-01-05 | Discharge: 2024-01-05 | Disposition: A | Discharge status: Home / Self Care | Source: Ambulatory Visit | Attending: General Surgery | Admitting: General Surgery

## 2024-01-05 ENCOUNTER — Encounter: Payer: Self-pay | Admitting: Genetic Counselor

## 2024-01-05 ENCOUNTER — Encounter: Payer: Self-pay | Admitting: Family Medicine

## 2024-01-05 ENCOUNTER — Telehealth: Payer: Self-pay | Admitting: Genetic Counselor

## 2024-01-05 DIAGNOSIS — F409 Phobic anxiety disorder, unspecified: Secondary | ICD-10-CM | POA: Insufficient documentation

## 2024-01-05 DIAGNOSIS — C50512 Malignant neoplasm of lower-outer quadrant of left female breast: Secondary | ICD-10-CM

## 2024-01-05 DIAGNOSIS — Z1379 Encounter for other screening for genetic and chromosomal anomalies: Secondary | ICD-10-CM | POA: Insufficient documentation

## 2024-01-05 HISTORY — PX: BREAST BIOPSY: SHX20

## 2024-01-05 HISTORY — DX: Phobic anxiety disorder, unspecified: F40.9

## 2024-01-05 NOTE — Assessment & Plan Note (Signed)
 Hyperlipidemia:Low fat diet discussed and encouraged.   Lipid Panel  Lab Results  Component Value Date   CHOL 169 07/12/2023   HDL 61 07/12/2023   LDLCALC 97 07/12/2023   TRIG 53 07/12/2023   CHOLHDL 2.8 07/12/2023     No med change , will update lab at next visit

## 2024-01-05 NOTE — Assessment & Plan Note (Signed)
 Increased symptom with season change, double zyrtec and Astelin added if needed

## 2024-01-05 NOTE — Progress Notes (Signed)
   Cheyenne Harris     MRN: [id number]      DOB: [birth year]  Chief Complaint  Patient presents with   Hypertension    Follow up on hypertension    HPI Cheyenne Harris is here for follow up and re-evaluation of chronic medical conditions, medication management and review of any available recent lab and radiology data.  Preventive health is updated, specifically  Cancer screening and Immunization.   New dx of  left breast cancer 12/2023, has lumpectomy, radiation and hormonal therapy lined up starting in 2 days Dealing with new dx has its mental challenges, sleep is somewhat difficult  at times as is to  be expected,  has great family support and is very comnfortable with her Oncology team   Recent surgery on RLE in Februaury currently immobilized  ROS Denies recent fever or chills. Denies sinus pressure, nasal congestion, ear pain or sore throat. Denies chest congestion, productive cough or wheezing. Denies chest pains, palpitations and leg swelling Denies abdominal pain, nausea, vomiting,diarrhea or constipation.   Denies dysuria, frequency, hesitancy or incontinence. Denies joint pain, swelling and limitation in mobility. Denies headaches, seizures, numbness, or tingling. . Denies skin break down or rash.   PE  BP 123/80   Pulse 81   Resp 16   Ht 5\' 4"  (1.626 m)   Wt 148 lb 0.6 oz (67.2 kg)   LMP 10/11/2014 Comment: hysterectomy  SpO2 98%   BMI 25.41 kg/m   Patient alert and oriented and in no cardiopulmonary distress.  HEENT: No facial asymmetry, EOMI,     Neck supple .  Chest: Clear to auscultation bilaterally.  CVS: S1, S2 no murmurs, no S3.Regular rate.  ABD: Soft non tender.   Ext: No edema  MS: Adequate ROM spine, shoulders,  reduced in right hip and knee.  Skin: Intact, no ulcerations or rash noted.  Psych: Good eye contact, normal affect. Memory intact not anxious or depressed appearing.  CNS: CN 2-12 intact, power,  normal throughout.no focal deficits  noted.   Assessment & Plan  Essential hypertension Controlled, no change in medication   Allergic rhinitis Increased symptom with season change, double zyrtec and Astelin added if needed  Hyperlipidemia LDL goal <100 Hyperlipidemia:Low fat diet discussed and encouraged.   Lipid Panel  Lab Results  Component Value Date   CHOL 169 07/12/2023   HDL 61 07/12/2023   LDLCALC 97 07/12/2023   TRIG 53 07/12/2023   CHOLHDL 2.8 07/12/2023     No med change , will update lab at next visit  Ductal carcinoma in situ (DCIS) of left breast New dx 12/2023,lumpectomy to be followed by radiation thn hormonal treatment . Pt states she had a 3 hour session with the team which was very informative and made her feel comfortable with her care Disability will be determined by Oncology  Insomnia due to anxiety and fear Mild in past month with new cancer dx Sleep hygiene reviewed, melatonin 1mg  to 5 mg at bedtime as needed, recommended.

## 2024-01-05 NOTE — Telephone Encounter (Signed)
 Disclosed negative genetics.  Pan-cancer panel is pending

## 2024-01-05 NOTE — Assessment & Plan Note (Signed)
 New dx 12/2023,lumpectomy to be followed by radiation thn hormonal treatment . Pt states she had a 3 hour session with the team which was very informative and made her feel comfortable with her care Disability will be determined by Oncology

## 2024-01-05 NOTE — Assessment & Plan Note (Signed)
 Controlled, no change in medication

## 2024-01-05 NOTE — Assessment & Plan Note (Signed)
 Mild in past month with new cancer dx Sleep hygiene reviewed, melatonin 1mg  to 5 mg at bedtime as needed, recommended.

## 2024-01-06 ENCOUNTER — Encounter (HOSPITAL_BASED_OUTPATIENT_CLINIC_OR_DEPARTMENT_OTHER)
Admission: RE | Disposition: A | Discharge status: Home / Self Care | Payer: Self-pay | Source: Home / Self Care | Attending: General Surgery

## 2024-01-06 ENCOUNTER — Ambulatory Visit (HOSPITAL_BASED_OUTPATIENT_CLINIC_OR_DEPARTMENT_OTHER)
Admission: RE | Admit: 2024-01-06 | Discharge: 2024-01-06 | Disposition: A | Discharge status: Home / Self Care | Attending: General Surgery | Admitting: General Surgery

## 2024-01-06 ENCOUNTER — Ambulatory Visit (HOSPITAL_BASED_OUTPATIENT_CLINIC_OR_DEPARTMENT_OTHER): Admitting: Anesthesiology

## 2024-01-06 ENCOUNTER — Ambulatory Visit
Admission: RE | Admit: 2024-01-06 | Discharge: 2024-01-06 | Disposition: A | Discharge status: Home / Self Care | Source: Ambulatory Visit | Attending: General Surgery | Admitting: General Surgery

## 2024-01-06 ENCOUNTER — Encounter (HOSPITAL_BASED_OUTPATIENT_CLINIC_OR_DEPARTMENT_OTHER): Payer: Self-pay | Admitting: General Surgery

## 2024-01-06 ENCOUNTER — Telehealth: Payer: Self-pay | Admitting: *Deleted

## 2024-01-06 ENCOUNTER — Other Ambulatory Visit: Payer: Self-pay

## 2024-01-06 ENCOUNTER — Encounter: Payer: Self-pay | Admitting: *Deleted

## 2024-01-06 DIAGNOSIS — Z79899 Other long term (current) drug therapy: Secondary | ICD-10-CM | POA: Insufficient documentation

## 2024-01-06 DIAGNOSIS — Z1722 Progesterone receptor negative status: Secondary | ICD-10-CM | POA: Diagnosis not present

## 2024-01-06 DIAGNOSIS — Z170 Estrogen receptor positive status [ER+]: Secondary | ICD-10-CM

## 2024-01-06 DIAGNOSIS — Z803 Family history of malignant neoplasm of breast: Secondary | ICD-10-CM | POA: Diagnosis not present

## 2024-01-06 DIAGNOSIS — I10 Essential (primary) hypertension: Secondary | ICD-10-CM | POA: Insufficient documentation

## 2024-01-06 DIAGNOSIS — C50512 Malignant neoplasm of lower-outer quadrant of left female breast: Secondary | ICD-10-CM

## 2024-01-06 DIAGNOSIS — D0592 Unspecified type of carcinoma in situ of left breast: Secondary | ICD-10-CM | POA: Insufficient documentation

## 2024-01-06 HISTORY — PX: BREAST LUMPECTOMY WITH RADIOACTIVE SEED LOCALIZATION: SHX6424

## 2024-01-06 SURGERY — BREAST LUMPECTOMY WITH RADIOACTIVE SEED LOCALIZATION
Anesthesia: General | Site: Breast | Laterality: Left

## 2024-01-06 MED ORDER — KETOROLAC TROMETHAMINE 30 MG/ML IJ SOLN
INTRAMUSCULAR | Status: AC
  Filled 2024-01-06: qty 1

## 2024-01-06 MED ORDER — PROPOFOL 500 MG/50ML IV EMUL
INTRAVENOUS | Status: DC | PRN
  Administered 2024-01-06: 200 ug/kg/min via INTRAVENOUS

## 2024-01-06 MED ORDER — LIDOCAINE-EPINEPHRINE (PF) 1 %-1:200000 IJ SOLN
INTRAMUSCULAR | Status: AC
  Filled 2024-01-06: qty 30

## 2024-01-06 MED ORDER — FENTANYL CITRATE (PF) 100 MCG/2ML IJ SOLN
INTRAMUSCULAR | Status: DC | PRN
  Administered 2024-01-06 (×2): 50 ug via INTRAVENOUS

## 2024-01-06 MED ORDER — ACETAMINOPHEN 500 MG PO TABS
1000.0000 mg | ORAL_TABLET | ORAL | Status: AC
  Administered 2024-01-06: 1000 mg via ORAL

## 2024-01-06 MED ORDER — MIDAZOLAM HCL 2 MG/2ML IJ SOLN
INTRAMUSCULAR | Status: AC
  Filled 2024-01-06: qty 2

## 2024-01-06 MED ORDER — FENTANYL CITRATE (PF) 100 MCG/2ML IJ SOLN
INTRAMUSCULAR | Status: AC
  Filled 2024-01-06: qty 2

## 2024-01-06 MED ORDER — CEFAZOLIN SODIUM-DEXTROSE 2-4 GM/100ML-% IV SOLN
INTRAVENOUS | Status: AC
  Filled 2024-01-06: qty 100

## 2024-01-06 MED ORDER — LACTATED RINGERS IV SOLN: INTRAVENOUS | Status: DC

## 2024-01-06 MED ORDER — HYDROCODONE-ACETAMINOPHEN 5-325 MG PO TABS
1.0000 | ORAL_TABLET | Freq: Four times a day (QID) | ORAL | 0 refills | Status: DC | PRN
Start: 2024-01-06 — End: 2024-05-05

## 2024-01-06 MED ORDER — FENTANYL CITRATE (PF) 100 MCG/2ML IJ SOLN
25.0000 ug | INTRAMUSCULAR | Status: DC | PRN
  Administered 2024-01-06 (×2): 50 ug via INTRAVENOUS

## 2024-01-06 MED ORDER — DEXAMETHASONE SODIUM PHOSPHATE 10 MG/ML IJ SOLN
INTRAMUSCULAR | Status: AC
  Filled 2024-01-06: qty 1

## 2024-01-06 MED ORDER — OXYCODONE HCL 5 MG/5ML PO SOLN: 5.0000 mg | Freq: Once | ORAL | Status: DC | PRN

## 2024-01-06 MED ORDER — ONDANSETRON HCL 4 MG/2ML IJ SOLN
INTRAMUSCULAR | Status: DC | PRN
Start: 2024-01-06 — End: 2024-01-06
  Administered 2024-01-06: 4 mg via INTRAVENOUS

## 2024-01-06 MED ORDER — OXYCODONE HCL 5 MG PO TABS: 5.0000 mg | ORAL_TABLET | Freq: Once | ORAL | Status: DC | PRN

## 2024-01-06 MED ORDER — MIDAZOLAM HCL 5 MG/5ML IJ SOLN
INTRAMUSCULAR | Status: DC | PRN
  Administered 2024-01-06: 2 mg via INTRAVENOUS

## 2024-01-06 MED ORDER — LACTATED RINGERS IV SOLN: INTRAVENOUS | Status: DC | PRN

## 2024-01-06 MED ORDER — CEFAZOLIN SODIUM-DEXTROSE 2-4 GM/100ML-% IV SOLN
2.0000 g | INTRAVENOUS | Status: AC
  Administered 2024-01-06: 2 g via INTRAVENOUS

## 2024-01-06 MED ORDER — PROPOFOL 10 MG/ML IV BOLUS
INTRAVENOUS | Status: DC | PRN
  Administered 2024-01-06: 170 mg via INTRAVENOUS
  Administered 2024-01-06 (×2): 50 mg via INTRAVENOUS

## 2024-01-06 MED ORDER — LIDOCAINE 2% (20 MG/ML) 5 ML SYRINGE
INTRAMUSCULAR | Status: AC
  Filled 2024-01-06: qty 5

## 2024-01-06 MED ORDER — LIDOCAINE-EPINEPHRINE (PF) 1 %-1:200000 IJ SOLN
INTRAMUSCULAR | Status: DC | PRN
  Administered 2024-01-06: 30 mL

## 2024-01-06 MED ORDER — LIDOCAINE 2% (20 MG/ML) 5 ML SYRINGE
INTRAMUSCULAR | Status: DC | PRN
  Administered 2024-01-06: 100 mg via INTRAVENOUS

## 2024-01-06 MED ORDER — BUPIVACAINE HCL (PF) 0.25 % IJ SOLN
INTRAMUSCULAR | Status: AC
  Filled 2024-01-06: qty 30

## 2024-01-06 MED ORDER — DROPERIDOL 2.5 MG/ML IJ SOLN: 0.6250 mg | Freq: Once | INTRAMUSCULAR | Status: DC | PRN

## 2024-01-06 MED ORDER — ONDANSETRON HCL 4 MG/2ML IJ SOLN
INTRAMUSCULAR | Status: AC
  Filled 2024-01-06: qty 2

## 2024-01-06 MED ORDER — DEXAMETHASONE SODIUM PHOSPHATE 10 MG/ML IJ SOLN
INTRAMUSCULAR | Status: DC | PRN
  Administered 2024-01-06: 8 mg via INTRAVENOUS

## 2024-01-06 MED ORDER — SCOPOLAMINE 1 MG/3DAYS TD PT72
1.0000 | MEDICATED_PATCH | Freq: Once | TRANSDERMAL | Status: DC
  Administered 2024-01-06: 1.5 mg via TRANSDERMAL

## 2024-01-06 MED ORDER — ACETAMINOPHEN 500 MG PO TABS
ORAL_TABLET | ORAL | Status: AC
  Filled 2024-01-06: qty 2

## 2024-01-06 MED ORDER — SCOPOLAMINE 1 MG/3DAYS TD PT72
MEDICATED_PATCH | TRANSDERMAL | Status: AC
  Filled 2024-01-06: qty 1

## 2024-01-06 MED ORDER — DEXMEDETOMIDINE HCL IN NACL 80 MCG/20ML IV SOLN
INTRAVENOUS | Status: DC | PRN
  Administered 2024-01-06: 8 ug via INTRAVENOUS

## 2024-01-06 SURGICAL SUPPLY — 45 items
BINDER BREAST LRG (GAUZE/BANDAGES/DRESSINGS) IMPLANT
BINDER BREAST MEDIUM (GAUZE/BANDAGES/DRESSINGS) IMPLANT
BINDER BREAST XLRG (GAUZE/BANDAGES/DRESSINGS) IMPLANT
BINDER BREAST XXLRG (GAUZE/BANDAGES/DRESSINGS) IMPLANT
BLADE SURG 10 STRL SS (BLADE) ×1 IMPLANT
BLADE SURG 15 STRL LF DISP TIS (BLADE) IMPLANT
CANISTER SUC SOCK COL 7IN (MISCELLANEOUS) IMPLANT
CANISTER SUCT 1200ML W/VALVE (MISCELLANEOUS) IMPLANT
CHLORAPREP W/TINT 26 (MISCELLANEOUS) ×1 IMPLANT
CLIP TI LARGE 6 (CLIP) ×1 IMPLANT
CLIP TI MEDIUM 6 (CLIP) IMPLANT
COVER BACK TABLE 60X90IN (DRAPES) ×1 IMPLANT
COVER MAYO STAND STRL (DRAPES) ×1 IMPLANT
COVER PROBE CYLINDRICAL 5X96 (MISCELLANEOUS) ×1 IMPLANT
DERMABOND ADVANCED .7 DNX12 (GAUZE/BANDAGES/DRESSINGS) ×1 IMPLANT
DRAPE LAPAROSCOPIC ABDOMINAL (DRAPES) ×1 IMPLANT
DRAPE UTILITY XL STRL (DRAPES) ×1 IMPLANT
ELECT COATED BLADE 2.86 ST (ELECTRODE) ×1 IMPLANT
ELECT REM PT RETURN 9FT ADLT (ELECTROSURGICAL) ×1 IMPLANT
ELECTRODE REM PT RTRN 9FT ADLT (ELECTROSURGICAL) ×1 IMPLANT
GAUZE SPONGE 4X4 12PLY STRL LF (GAUZE/BANDAGES/DRESSINGS) ×1 IMPLANT
GLOVE BIO SURGEON STRL SZ 6 (GLOVE) ×1 IMPLANT
GLOVE BIOGEL PI IND STRL 6.5 (GLOVE) ×1 IMPLANT
GOWN STRL REUS W/ TWL LRG LVL3 (GOWN DISPOSABLE) ×1 IMPLANT
GOWN STRL REUS W/ TWL XL LVL3 (GOWN DISPOSABLE) ×1 IMPLANT
KIT MARKER MARGIN INK (KITS) ×1 IMPLANT
LIGHT WAVEGUIDE WIDE FLAT (MISCELLANEOUS) IMPLANT
NDL HYPO 25X1 1.5 SAFETY (NEEDLE) ×1 IMPLANT
NEEDLE HYPO 25X1 1.5 SAFETY (NEEDLE) ×1 IMPLANT
NS IRRIG 1000ML POUR BTL (IV SOLUTION) ×1 IMPLANT
PACK BASIN DAY SURGERY FS (CUSTOM PROCEDURE TRAY) ×1 IMPLANT
PENCIL SMOKE EVACUATOR (MISCELLANEOUS) ×1 IMPLANT
SLEEVE SCD COMPRESS KNEE MED (STOCKING) ×1 IMPLANT
SPIKE FLUID TRANSFER (MISCELLANEOUS) IMPLANT
SPONGE T-LAP 18X18 ~~LOC~~+RFID (SPONGE) ×1 IMPLANT
STRIP CLOSURE SKIN 1/2X4 (GAUZE/BANDAGES/DRESSINGS) ×1 IMPLANT
SUT MNCRL AB 4-0 PS2 18 (SUTURE) ×1 IMPLANT
SUT SILK 2 0 SH (SUTURE) IMPLANT
SUT VIC AB 2-0 SH 27XBRD (SUTURE) ×1 IMPLANT
SUT VIC AB 3-0 SH 27X BRD (SUTURE) ×1 IMPLANT
SYR CONTROL 10ML LL (SYRINGE) ×1 IMPLANT
TOWEL GREEN STERILE FF (TOWEL DISPOSABLE) ×1 IMPLANT
TRAY FAXITRON CT DISP (TRAY / TRAY PROCEDURE) ×1 IMPLANT
TUBE CONNECTING 20X1/4 (TUBING) IMPLANT
YANKAUER SUCT BULB TIP NO VENT (SUCTIONS) IMPLANT

## 2024-01-06 NOTE — Interval H&P Note (Signed)
 History and Physical Interval Note:  01/06/2024 12:27 PM  Cheyenne Harris  has presented today for surgery, with the diagnosis of LEFT BREAST CANCER.  The various methods of treatment have been discussed with the patient and family. After consideration of risks, benefits and other options for treatment, the patient has consented to  Procedure(s): BREAST LUMPECTOMY WITH RADIOACTIVE SEED LOCALIZATION (Left) as a surgical intervention.  The patient's history has been reviewed, patient examined, no change in status, stable for surgery.  I have reviewed the patient's chart and labs.  Questions were answered to the patient's satisfaction.     Almond Lint

## 2024-01-06 NOTE — Transfer of Care (Signed)
 Immediate Anesthesia Transfer of Care Note  Patient: Cheyenne Harris  Procedure(s) Performed: Procedure(s) (LRB): BREAST LUMPECTOMY WITH RADIOACTIVE SEED LOCALIZATION (Left)  Patient Location: PACU  Anesthesia Type: General  Level of Consciousness: awake, oriented, sedated and patient cooperative  Airway & Oxygen Therapy: Patient Spontanous Breathing and Patient connected to face mask oxygen  Post-op Assessment: Report given to PACU RN and Post -op Vital signs reviewed and stable  Post vital signs: Reviewed and stable  Complications: No apparent anesthesia complications  Last Vitals:  Vitals Value Taken Time  BP 137/92 01/06/24 1407  Temp 36.7 C 01/06/24 1407  Pulse 77 01/06/24 1411  Resp 9 01/06/24 1411  SpO2 100 % 01/06/24 1411  Vitals shown include unfiled device data.  Last Pain:  Vitals:   01/06/24 1135  TempSrc: Tympanic  PainSc: 0-No pain      Patients Stated Pain Goal: 8 (01/06/24 1135)  Complications: No notable events documented.

## 2024-01-06 NOTE — Anesthesia Preprocedure Evaluation (Addendum)
 Anesthesia Evaluation  Patient identified by MRN, date of birth, ID band Patient awake    Reviewed: Allergy & Precautions, NPO status , Patient's Chart, lab work & pertinent test results  History of Anesthesia Complications (+) PONV and history of anesthetic complications  Airway Mallampati: II  TM Distance: >3 FB Neck ROM: Full    Dental no notable dental hx.    Pulmonary neg pulmonary ROS   Pulmonary exam normal        Cardiovascular hypertension, Pt. on medications Normal cardiovascular exam     Neuro/Psych  Headaches    GI/Hepatic negative GI ROS, Neg liver ROS,,,  Endo/Other  negative endocrine ROS    Renal/GU negative Renal ROS     Musculoskeletal negative musculoskeletal ROS (+)    Abdominal   Peds  Hematology negative hematology ROS (+)   Anesthesia Other Findings Malignant neoplasm of lower-outer quadrant of left breast  Reproductive/Obstetrics                             Anesthesia Physical Anesthesia Plan  ASA: 2  Anesthesia Plan: General   Post-op Pain Management: Tylenol PO (pre-op)*   Induction: Intravenous  PONV Risk Score and Plan: 4 or greater and Treatment may vary due to age or medical condition, Ondansetron, Dexamethasone, Midazolam, Propofol infusion, TIVA and Scopolamine patch - Pre-op  Airway Management Planned: LMA  Additional Equipment: None  Intra-op Plan:   Post-operative Plan: Extubation in OR  Informed Consent: I have reviewed the patients History and Physical, chart, labs and discussed the procedure including the risks, benefits and alternatives for the proposed anesthesia with the patient or authorized representative who has indicated his/her understanding and acceptance.     Dental advisory given  Plan Discussed with: CRNA  Anesthesia Plan Comments:        Anesthesia Quick Evaluation

## 2024-01-06 NOTE — Op Note (Signed)
 Left Breast Radioactive seed localized lumpectomy  Indications: This patient presents with history of left breast cancer, lower outer quadrant, intermediate grade DCIS,  receptors +/-  Pre-operative Diagnosis: cTis left breast cancer  Post-operative Diagnosis: Same  Surgeon: Almond Lint   Anesthesia: General endotracheal anesthesia  ASA Class: 2  Procedure Details  The patient was seen in the Holding Room. The risks, benefits, complications, treatment options, and expected outcomes were discussed with the patient. The possibilities of bleeding, infection, the need for additional procedures, failure to diagnose a condition, and creating a complication requiring other procedures or operations were discussed with the patient. The patient concurred with the proposed plan, giving informed consent.  The site of surgery properly noted/marked. The patient was taken to Operating Room # 2, identified, and the procedure verified as left breast seed localized lumpectomy.  The left breast and chest were prepped and draped in standard fashion. A lateral circumareolar incision was made near the previously placed radioactive seed.  Dissection was carried down around the point of maximum signal intensity. The cautery was used to perform the dissection.   The specimen was inked with the margin marker paint kit.    Specimen radiography confirmed inclusion of part of the mammographic lesion. The inferior margin was taken and this showed the seed and the clip. The background signal in the breast was zero.  Hemostasis was achieved with cautery.  The cavity was marked with clips on each border other than the anterior border.  The wound was irrigated and closed with 3-0 vicryl interrupted deep dermal sutures and 4-0 monocryl running subcuticular suture.      Sterile dressings were applied. At the end of the operation, all sponge, instrument, and needle counts were correct.   Findings: Seed, clip in specimen.   anterior margin is skin.    Estimated Blood Loss:  min         Specimens: left breast tissue, inferior margin with seed         Complications:  None; patient tolerated the procedure well.         Disposition: PACU - hemodynamically stable.         Condition: stable

## 2024-01-06 NOTE — Discharge Instructions (Addendum)
 [facility] Office Phone Number [phone]  BREAST BIOPSY/ PARTIAL MASTECTOMY: POST OP INSTRUCTIONS  Always review your discharge instruction sheet given to you by the facility where your surgery was performed.  IF YOU HAVE DISABILITY OR FAMILY LEAVE FORMS, YOU MUST BRING THEM TO THE OFFICE FOR PROCESSING.  DO NOT GIVE THEM TO YOUR DOCTOR.  Take 2 tylenol (acetominophen) three times a day for 3 days. Next dose at 7:45pm this evening.  If you still have pain, add ibuprofen with food in between if able to take this (if you have kidney issues or stomach issues, do not take ibuprofen). May take ibuprofen whenever needed. If both of those are not enough, add the narcotic pain pill.  If you find you are needing a lot of this overnight after surgery, call the next morning for a refill.  Your prescription pain medicine has 325 mg tylenol in it with the narcotic, so please monitor your tylenol (acetaminophen) intake. Do not exceed 3000mg  tylenol in a 24 hour period.  Prescriptions will not be filled after 5pm or on week-ends. Take your usually prescribed medications unless otherwise directed You should eat very light the first 24 hours after surgery, such as soup, crackers, pudding, etc.  Resume your normal diet the day after surgery. Most patients will experience some swelling and bruising in the breast.  Ice packs and a good support bra will help.  Swelling and bruising can take several days to resolve.  It is common to experience some constipation if taking pain medication after surgery.  Increasing fluid intake and taking a stool softener will usually help or prevent this problem from occurring.  A mild laxative (Milk of Magnesia or Miralax) should be taken according to package directions if there are no bowel movements after 48 hours. Unless discharge instructions indicate otherwise, you may remove your bandages 48 hours after surgery, and you may shower at that time.  You may have  steri-strips (small skin tapes) in place directly over the incision.  These strips should be left on the skin at least for for 7-10 days.    ACTIVITIES:  You may resume regular daily activities (gradually increasing) beginning the next day.  Wearing a good support bra or sports bra (or the breast binder) minimizes pain and swelling.  You may have sexual intercourse when it is comfortable. No heavy lifting for 1-2 weeks (not over around 10 pounds).  You may drive when you no longer are taking prescription pain medication, you can comfortably wear a seatbelt, and you can safely maneuver your car and apply brakes. RETURN TO WORK:  __________3-14 days depending on job. _______________ Bonita Quin should see your doctor in the office for a follow-up appointment approximately two weeks after your surgery.  Your doctor's nurse will typically make your follow-up appointment when she calls you with your pathology report.  Expect your pathology report 3-4 business days after your surgery.  You may call to check if you do not hear from [place] after three days.   WHEN TO CALL YOUR DOCTOR: Fever over 101.0 Nausea and/or vomiting. Extreme swelling or bruising. Continued bleeding from incision. Increased pain, redness, or drainage from the incision.  The clinic staff is available to answer your questions during regular business hours.  Please don't hesitate to call and ask to speak to one of the nurses for clinical concerns.  If you have a medical emergency, go to the nearest emergency room or call 911.  A surgeon from [facility]  is always on call at the hospital.  For further questions, please visit [email or web address]    Post Anesthesia Home Care Instructions  Activity: Get plenty of rest for the remainder of the day. A responsible individual must stay with you for 24 hours following the procedure.  For the next 24 hours, DO NOT: -Drive a car -Advertising copywriter -Drink alcoholic  beverages -Take any medication unless instructed by your physician -Make any legal decisions or sign important papers.  Meals: Start with liquid foods such as gelatin or soup. Progress to regular foods as tolerated. Avoid greasy, spicy, heavy foods. If nausea and/or vomiting occur, drink only clear liquids until the nausea and/or vomiting subsides. Call your physician if vomiting continues.  Special Instructions/Symptoms: Your throat may feel dry or sore from the anesthesia or the breathing tube placed in your throat during surgery. If this causes discomfort, gargle with warm salt water. The discomfort should disappear within 24 hours.  If you had a scopolamine patch placed behind your ear for the management of post- operative nausea and/or vomiting:  1. The medication in the patch is effective for 72 hours, after which it should be removed.  Wrap patch in a tissue and discard in the trash. Wash hands thoroughly with soap and water. 2. You may remove the patch earlier than 72 hours if you experience unpleasant side effects which may include dry mouth, dizziness or visual disturbances. 3. Avoid touching the patch. Wash your hands with soap and water after contact with the patch.

## 2024-01-06 NOTE — Anesthesia Postprocedure Evaluation (Signed)
 Anesthesia Post Note  Patient: Cheyenne Harris  Procedure(s) Performed: BREAST LUMPECTOMY WITH RADIOACTIVE SEED LOCALIZATION (Left: Breast)     Patient location during evaluation: PACU Anesthesia Type: General Level of consciousness: awake and alert Pain management: pain level controlled Vital Signs Assessment: post-procedure vital signs reviewed and stable Respiratory status: spontaneous breathing, nonlabored ventilation and respiratory function stable Cardiovascular status: blood pressure returned to baseline Postop Assessment: no apparent nausea or vomiting Anesthetic complications: no   No notable events documented.  Last Vitals:  Vitals:   01/06/24 1500 01/06/24 1515  BP: 122/79 125/80  Pulse: 64 71  Resp: 11 12  Temp:  (!) 36.4 C  SpO2: 95% 100%    Last Pain:  Vitals:   01/06/24 1515  TempSrc:   PainSc: 3                  Shanda Howells

## 2024-01-06 NOTE — Anesthesia Procedure Notes (Signed)
 Procedure Name: LMA Insertion Date/Time: 01/06/2024 12:54 PM  Performed by: Yolanda Bonine, CRNAPre-anesthesia Checklist: Patient identified, Emergency Drugs available, Suction available, Patient being monitored and Timeout performed Patient Re-evaluated:Patient Re-evaluated prior to induction Oxygen Delivery Method: Circle system utilized Preoxygenation: Pre-oxygenation with 100% oxygen Induction Type: IV induction Ventilation: Mask ventilation without difficulty LMA: LMA inserted LMA Size: 4.0 Number of attempts: 1 Placement Confirmation: positive ETCO2 Dental Injury: Teeth and Oropharynx as per pre-operative assessment

## 2024-01-06 NOTE — Telephone Encounter (Signed)
 Spoke with patient to follow up from [facility] 3/26 and assess navigation needs. Patient denies any questions or concerns at this time.  Encouraged her to call should anything arise.

## 2024-01-07 ENCOUNTER — Encounter (HOSPITAL_BASED_OUTPATIENT_CLINIC_OR_DEPARTMENT_OTHER): Payer: Self-pay | Admitting: General Surgery

## 2024-01-10 LAB — SURGICAL PATHOLOGY

## 2024-01-11 ENCOUNTER — Encounter: Payer: Self-pay | Admitting: *Deleted

## 2024-01-18 ENCOUNTER — Ambulatory Visit: Payer: Self-pay | Admitting: Genetic Counselor

## 2024-01-18 DIAGNOSIS — Z803 Family history of malignant neoplasm of breast: Secondary | ICD-10-CM

## 2024-01-18 DIAGNOSIS — D0512 Intraductal carcinoma in situ of left breast: Secondary | ICD-10-CM

## 2024-01-18 DIAGNOSIS — Z1379 Encounter for other screening for genetic and chromosomal anomalies: Secondary | ICD-10-CM

## 2024-01-18 NOTE — Progress Notes (Signed)
 HPI:   Cheyenne Harris was previously seen in the [facility] due to a personal history of breast cancer and concerns regarding a hereditary predisposition to cancer.    Ms. Molony recent genetic test results were disclosed to her by telephone (STAT results) and MyChart (full panel). These results and recommendations are discussed in more detail below.  CANCER HISTORY:  Oncology History  Ductal carcinoma in situ (DCIS) of left breast  12/13/2023 Mammogram   Mammogram showed possible asymmetry and separate calcifications in the left breast. Diagnostic mammogram showed indeterminate 1.2 cm group of calcs in the lower outer left breast.   12/22/2023 Pathology Results   Left breast needle core biopsy showed 1.2 cms group of outer calcs, DCIS, intermediate grade, ER 100%, PR 0%   12/27/2023 Initial Diagnosis   Ductal carcinoma in situ (DCIS) of left breast   01/04/2024 Genetic Testing   Negative [facility] CustomNext-Cancer +RNAinsight Panel.  Report date is 01/12/2024.   The [facility] CustomNext-Cancer +RNAinsight Panel (CancerNext + kidney cancer genes) includes sequencing, deletion/duplication, and RNA analysis for the following 44 genes:  APC, ATM, BAP1, BARD1, BMPR1A, BRCA1, BRCA2, BRIP1, CDH1, CDKN2A, CHEK2, FH, FLCN, MAX, MET, MLH1, MSH2, MSH6, MUTYH, NF1, NTHL1, PALB2, PMS2, PTEN, RAD51C, RAD51D, SDHA, SDHB, SDHC, SDHD, SMAD4, STK11, TP53, TSC1, TSC2 and VHL (sequencing and deletion/duplication); AXIN2, HOXB13, MBD4, MSH3, POLD1 and POLE (sequencing only); EPCAM and GREM1 (deletion/duplication only).     FAMILY HISTORY:  We obtained a detailed, 4-generation family history.  Significant diagnoses are listed below:      Family History  Problem Relation Age of Onset   Throat cancer Father          d. 29   Breast cancer Paternal Aunt          x3 pat aunts; some dx before age 29   Kidney cancer Maternal Grandmother 19   Breast cancer Other          distant maternal cousin w/  breast cancer dx <50       Ms. Elie is unaware of previous family history of genetic testing for hereditary cancer risks.  Other relatives are unavailable for genetic testing at this time.    There is no reported Ashkenazi Jewish ancestry. There is no known consanguinity.  GENETIC TEST RESULTS:  The [facility] CustomNext-Cancer +RNAinsight Panel found no pathogenic mutations.  The [facility] CustomNext-Cancer +RNAinsight Panel (CancerNext + kidney cancer genes) includes sequencing, deletion/duplication, and RNA analysis for the following 44 genes:  APC, ATM, BAP1, BARD1, BMPR1A, BRCA1, BRCA2, BRIP1, CDH1, CDKN2A, CHEK2, FH, FLCN, MAX, MET, MLH1, MSH2, MSH6, MUTYH, NF1, NTHL1, PALB2, PMS2, PTEN, RAD51C, RAD51D, SDHA, SDHB, SDHC, SDHD, SMAD4, STK11, TP53, TSC1, TSC2 and VHL (sequencing and deletion/duplication); AXIN2, HOXB13, MBD4, MSH3, POLD1 and POLE (sequencing only); EPCAM and GREM1 (deletion/duplication only).  The test report has been scanned into EPIC and is located under the Molecular Pathology section of the Results Review tab.  A portion of the result report is included below for reference. Genetic testing reported out on January 12, 2024.    Even though a pathogenic variant was not identified, possible explanations for the cancer in the family may include: There may be no hereditary risk for cancer in the family. The cancers in Ms. Masri and/or her family may be sporadic/familial or due to other genetic and environmental factors.  Most cancer is not hereditary.  There may be a gene mutation in one of these genes that current testing methods cannot  detect but that chance is small. There could be another gene that has not yet been discovered, or that we have not yet tested, that is responsible for the cancer diagnoses in the family.  It is also possible there is a hereditary cause for the cancer in the family that Ms. Bugay did not inherit.   Therefore, it is important to remain in touch with  cancer genetics in the future so that we can continue to offer Ms. Tuite the most up to date genetic testing.   ADDITIONAL GENETIC TESTING:   Ms. Marxen genetic testing was fairly extensive.  If there are additional relevant genes identified to increase cancer risk that can be analyzed in the future, we would be happy to discuss and coordinate this testing at that time.    CANCER SCREENING RECOMMENDATIONS:  Ms. Kopke test result is considered negative (normal).  This means that we have not identified a hereditary cause for her personal history of breast cancer at this time.   An individual's cancer risk and medical management are not determined by genetic test results alone. Overall cancer risk assessment incorporates additional factors, including personal medical history, family history, and any available genetic information that may result in a personalized plan for cancer prevention and surveillance. Therefore, it is recommended she continue to follow the cancer management and screening guidelines provided by her oncology and primary healthcare provider.  RECOMMENDATIONS FOR FAMILY MEMBERS:   Since she did not inherit a identifiable mutation in a cancer predisposition gene included on this panel, her children could not have inherited a known mutation from her in one of these genes. Individuals in this family might be at some increased risk of developing cancer, over the general population risk, due to the family history of cancer.  Individuals in the family should notify their providers of the family history of cancer. We recommend women in this family have a yearly mammogram beginning at age 90, or 71 years younger than the earliest onset of cancer, an annual clinical breast exam, and perform monthly breast self-exams.  Risk models that take into account family history and hormonal history may be helpful in determining appropriate breast cancer screening options for family members. Other  members of the family may still carry a pathogenic variant in one of these genes that Ms. Alamillo did not inherit. Based on the family history, we recommend her paternal aunts, who were diagnosed with breast cancer, have genetic counseling and testing. Ms. Calles can let us  know if we can be of any assistance in coordinating genetic counseling and/or testing for these family members.     FOLLOW-UP:  Cancer genetics is a rapidly advancing field and it is possible that new genetic tests will be appropriate for her and/or her family members in the future. We encourage Ms. Hosein to remain in contact with cancer genetics, so we can update her personal and family histories and let her know of advances in cancer genetics that may benefit this family.   Our contact number was provided.  They are welcome to call us  at anytime with additional questions or concerns.   Tomer Chalmers M. Ora Billing, MS, [facility] Genetic Counselor [email or web address] (P) [phone]

## 2024-01-25 NOTE — Progress Notes (Incomplete)
 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  Patient identity verified x2.  Intent: Curative Location: LT breast  Patient diagnosed with ductal carcinoma in situ (DCIS) of the left breast after a mammogram revealed a cluster of calcifications in the lower outer quadrant, measuring 12 millimeters. A biopsy confirmed estrogen receptor-positive DCIS.   Patient states "occasional, sharp tenderness at incision line of LT breast 4/10 w/ mild bruising."   Pain: 4/10 LT breast ROM: Full bilaterally Lymphedema: Denies Cardiac issue: Denies Lung issue: Denies Appetite: Good  All pertinent body systems reviewed w/ patient. Patient denies any other related issues at this time.  Meaningful use complete.  Vitals- BP 132/70 (BP Location: Left Arm, Patient Position: Sitting, Cuff Size: Normal)   Pulse 75   Temp (!) 97.1 F (36.2 C) (Temporal)   Resp 18   Ht 5\' 4"  (1.626 m)   Wt 153 lb 4 oz (69.5 kg)   LMP 10/11/2014 Comment: hysterectomy  SpO2 100%   BMI 26.31 kg/m   This concludes the interaction.   Additional Complaints / other details:   SAFETY ISSUES: Prior radiation? no Pacemaker/ICD? no Possible current pregnancy? NO- Hysterectomy (partial-retained ovaries) Is the patient on methotrexate? No  Family History of Breast/Ovarian/Prostate Cancer: Yes- Breast cancer (father's side) / Kidney cancer (Mother's side).  Medical oncologist, treatment if any:  Dr. Arno Bibles 12/29/2023 Treatment plan: - Perform lumpectomy on the left breast. - Administer radiation therapy post-lumpectomy. - Conduct blood work to determine menopausal status. (Labs 12/29/2023 are suggestive of post menopausal status.)  - Initiate anti-estrogen therapy post-radiation based on menopausal status. - Discussed potential anti-estrogen therapies: Tamoxifen or aromatase inhibitors. - Educated about side effects of tamoxifen (hot flashes, vaginal discharge, risk of  blood clots) and aromatase inhibitors (hot flashes, vaginal dryness, bone density loss).    Screening Mammogram: 12/13/2023 IMPRESSION: Showed possible asymmetry and separate calcifications in the left breast.   Diagnostic Mammogram: 12/21/2023: IMPRESSION: Showed indeterminate 1.2 cm group of calcs in the lower outer left breast.   Surgeon and surgical plan, if any: Left Breast Radioactive seed localized lumpectomy, performed on 01/06/2024 by Dr. Cherlynn Cornfield   12/22/2023 Pathology Results Left breast needle core biopsy showed 1.2 cms group of outer calcs, DCIS, intermediate grade, ER 100%, PR 0%  Histology per Pathology Report: Intermediate to high-grade Ductal carcinoma in situ (DCIS), of LT breast.  SURGICAL PATHOLOGY/ FINAL MICROSCOPIC DIAGNOSIS: 01/06/2024 A. BREAST, LEFT, LUMPECTOMY:  - Ductal carcinoma in situ, intermediate to high-grade, with rare  calcifications and pagetoid spared  - Evidence of invasive carcinoma is not seen  - Focal biopsy site changes  - Fibrocystic change  - See oncology table   B. BREAST, LEFT INFERIOR MARGIN, EXCISION:  - Small focus of intermediate-grade ductal carcinoma in situ with  pagetoid spread  - No evidence of invasive carcinoma  - Inked new inferior margin is negative for DCIS  - Biopsy site change  - Fibrocystic change    Avery Bodo, LPN

## 2024-01-27 ENCOUNTER — Ambulatory Visit
Admission: RE | Admit: 2024-01-27 | Discharge: 2024-01-27 | Disposition: A | Discharge status: Home / Self Care | Source: Ambulatory Visit | Attending: Radiation Oncology | Admitting: Radiation Oncology

## 2024-01-27 ENCOUNTER — Encounter: Payer: Self-pay | Admitting: Radiation Oncology

## 2024-01-27 VITALS — BP 132/70 | HR 75 | Temp 97.1°F | Resp 18 | Ht 64.0 in | Wt 153.2 lb

## 2024-01-27 DIAGNOSIS — Z170 Estrogen receptor positive status [ER+]: Secondary | ICD-10-CM | POA: Diagnosis not present

## 2024-01-27 DIAGNOSIS — Z8051 Family history of malignant neoplasm of kidney: Secondary | ICD-10-CM | POA: Insufficient documentation

## 2024-01-27 DIAGNOSIS — I10 Essential (primary) hypertension: Secondary | ICD-10-CM | POA: Insufficient documentation

## 2024-01-27 DIAGNOSIS — Z79899 Other long term (current) drug therapy: Secondary | ICD-10-CM | POA: Diagnosis not present

## 2024-01-27 DIAGNOSIS — Z801 Family history of malignant neoplasm of trachea, bronchus and lung: Secondary | ICD-10-CM | POA: Diagnosis not present

## 2024-01-27 DIAGNOSIS — D0512 Intraductal carcinoma in situ of left breast: Secondary | ICD-10-CM

## 2024-01-27 DIAGNOSIS — E785 Hyperlipidemia, unspecified: Secondary | ICD-10-CM | POA: Insufficient documentation

## 2024-01-27 DIAGNOSIS — Z803 Family history of malignant neoplasm of breast: Secondary | ICD-10-CM | POA: Diagnosis not present

## 2024-01-27 DIAGNOSIS — Z90722 Acquired absence of ovaries, bilateral: Secondary | ICD-10-CM | POA: Diagnosis not present

## 2024-01-27 DIAGNOSIS — Z1722 Progesterone receptor negative status: Secondary | ICD-10-CM | POA: Diagnosis not present

## 2024-01-27 DIAGNOSIS — Z90710 Acquired absence of both cervix and uterus: Secondary | ICD-10-CM | POA: Diagnosis not present

## 2024-01-27 NOTE — Progress Notes (Signed)
 Radiation Oncology         [phone]) [phone] ________________________________  Name: Cheyenne Harris        MRN: [id number]  Date of Service: 01/27/2024 DOB: 01-31-1966  WU:[id number], Cain Castillo, MD  Murleen Arms, MD     REFERRING PHYSICIAN: Murleen Arms, MD   DIAGNOSIS: The encounter diagnosis was Ductal carcinoma in situ (DCIS) of left breast.   HISTORY OF PRESENT ILLNESS: Cheyenne Harris is a 58 y.o. female originally seen in the multidisciplinary breast clinic for a new diagnosis of left breast cancer. The patient was noted to have screening detected left breast calcifications and asymmetry on 12/13/23. She returned for diagnostic work up on 12/21/23 and this confirmed the persistence of calcifications though the asymmetry resolved. The calcifications were indeterminate, and measured 1.2 cm in the lower outer quadrant of the left breast. A biopsy on 12/22/23 showed  intermediate grade DCIS that was ER positive, PR negative.   Since her last visit, the patient underwent left lumpectomy with Dr. Cherlynn Cornfield on 01/06/2024, this revealed intermediate to high-grade DCIS with rare calcifications and pagetoid appearance with no invasive carcinoma was appreciated.  An additional inferior margin excision showed a small focus of intermediate grade DCIS also with pagetoid spread again no invasive carcinoma was noted and the new inferior margin was negative.  It was 2 mm when measured to the inked margin.  She has been healing from her surgery and is seen today to discuss adjuvant radiation.  She has plans to begin antiestrogen therapy with Dr. Arno Bibles as well following radiation therapy.   PREVIOUS RADIATION THERAPY: No   PAST MEDICAL HISTORY:  Past Medical History:  Diagnosis Date   Complication of anesthesia    Hyperlipidemia    Hypertension    PONV (postoperative nausea and vomiting)        PAST SURGICAL HISTORY: Past Surgical History:  Procedure Laterality Date   ABDOMINAL HYSTERECTOMY      BILATERAL SALPINGECTOMY Bilateral 03/12/2015   Procedure: BILATERAL SALPINGECTOMY;  Surgeon: Albino Hum, MD;  Location: [facility];  Service: Gynecology;  Laterality: Bilateral;   BREAST BIOPSY Left 12/22/2023   MM LT BREAST BX W LOC DEV 1ST LESION IMAGE BX SPEC STEREO GUIDE 12/22/2023 [facility]   BREAST BIOPSY  01/05/2024   MM LT RADIOACTIVE SEED LOC MAMMO GUIDE 01/05/2024 [facility]   BREAST LUMPECTOMY WITH RADIOACTIVE SEED LOCALIZATION Left 01/06/2024   Procedure: BREAST LUMPECTOMY WITH RADIOACTIVE SEED LOCALIZATION;  Surgeon: Lockie Rima, MD;  Location: [facility];  Service: General;  Laterality: Left;   COLONOSCOPY N/A 04/13/2018   Procedure: COLONOSCOPY;  Surgeon: Ruby Corporal, MD;  Location: [facility];  Service: Endoscopy;  Laterality: N/A;  730   MENISCUS REPAIR Right 11/24/2023   [facility]   SUPRACERVICAL ABDOMINAL HYSTERECTOMY N/A 03/12/2015   Procedure: HYSTERECTOMY SUPRACERVICAL ABDOMINAL;  Surgeon: Albino Hum, MD;  Location: [facility];  Service: Gynecology;  Laterality: N/A;   WISDOM TOOTH EXTRACTION       FAMILY HISTORY:  Family History  Problem Relation Age of Onset   Hypertension Mother    Throat cancer Father        d. 46   Hypertension Sister    Hypertension Sister    Hypertension Brother    Breast cancer Paternal Aunt        x3 pat aunts; some dx before age 28   Diabetes Maternal Grandmother    Heart disease Maternal Grandmother    Kidney cancer  Maternal Grandmother 97   Breast cancer Other        distant maternal cousin w/ breast cancer dx <50     SOCIAL HISTORY:  reports that she has never smoked. She has never used smokeless tobacco. She reports current alcohol use. She reports that she does not use drugs. The patient is married and lives in [place]. She works in [employer] for [employer]. She's currently on disability for recovering from right knee surgery.   She enjoys walking is back to walking a mile, but hopes to  get back to her baseline of 5 miles per day.   ALLERGIES: Sulfa antibiotics   MEDICATIONS:  Current Outpatient Medications  Medication Sig Dispense Refill   amLODipine  (NORVASC ) 2.5 MG tablet TAKE 1 TABLET BY MOUTH EVERY DAY 90 tablet 3   amLODipine  (NORVASC ) 5 MG tablet TAKE 1 TABLET (5 MG TOTAL) BY MOUTH DAILY. 90 tablet 3   azelastine  (ASTELIN ) 0.1 % nasal spray Place 2 sprays into both nostrils 2 (two) times daily. Use in each nostril as directed 30 mL 12   cetirizine  (ZYRTEC ) 10 MG tablet Take 10 mg by mouth daily.     polyethylene glycol powder (GLYCOLAX /MIRALAX ) 17 GM/SCOOP powder Take 17 g by mouth in the morning and at bedtime. 510 g 5   rosuvastatin  (CRESTOR ) 5 MG tablet TAKE 1 TABLET (5 MG TOTAL) BY MOUTH DAILY. 90 tablet 1   HYDROcodone -acetaminophen  (NORCO/VICODIN) 5-325 MG tablet Take 1 tablet by mouth every 6 (six) hours as needed for moderate pain (pain score 4-6). (Patient not taking: Reported on 01/27/2024) 5 tablet 0   No current facility-administered medications for this encounter.     REVIEW OF SYSTEMS: On review of systems, the patient reports that she is doing well without concerns for her breast. She is still seeing PT for her knee for the next two weeks. She feels like her recovery from her knee surgery is ongoing as well. No other complaints are verbalized.      PHYSICAL EXAM:  Wt Readings from Last 3 Encounters:  01/27/24 153 lb 4 oz (69.5 kg)  01/06/24 147 lb 0.8 oz (66.7 kg)  01/04/24 148 lb 0.6 oz (67.2 kg)   Temp Readings from Last 3 Encounters:  01/27/24 (!) 97.1 F (36.2 C) (Temporal)  01/06/24 (!) 97.5 F (36.4 C)  12/29/23 97.8 F (36.6 C) (Temporal)   BP Readings from Last 3 Encounters:  01/27/24 132/70  01/06/24 125/80  01/04/24 123/80   Pulse Readings from Last 3 Encounters:  01/27/24 75  01/06/24 71  01/04/24 81    In general this is a well appearing African American female in no acute distress. She's alert and oriented x4 and  appropriate throughout the examination. Cardiopulmonary assessment is negative for acute distress and she exhibits normal effort.  Her left breast incision is well-healed without erythema, separation or drainage.    ECOG = 1  0 - Asymptomatic (Fully active, able to carry on all predisease activities without restriction)  1 - Symptomatic but completely ambulatory (Restricted in physically strenuous activity but ambulatory and able to carry out work of a light or sedentary nature. For example, light housework, office work)  2 - Symptomatic, <50% in bed during the day (Ambulatory and capable of all self care but unable to carry out any work activities. Up and about more than 50% of waking hours)  3 - Symptomatic, >50% in bed, but not bedbound (Capable of only limited self-care, confined to bed or  chair 50% or more of waking hours)  4 - Bedbound (Completely disabled. Cannot carry on any self-care. Totally confined to bed or chair)  5 - Death   Aurea Blossom MM, Creech RH, Tormey DC, et al. [phone]). "Toxicity and response criteria of the [facility]". Am. Hillard Lowes. Oncol. 5 (6): 649-55    LABORATORY DATA:  Lab Results  Component Value Date   WBC 6.0 12/29/2023   HGB 14.1 12/29/2023   HCT 42.7 12/29/2023   MCV 87.0 12/29/2023   PLT 209 12/29/2023   Lab Results  Component Value Date   NA 141 12/29/2023   K 4.1 12/29/2023   CL 105 12/29/2023   CO2 28 12/29/2023   Lab Results  Component Value Date   ALT 10 12/29/2023   AST 15 12/29/2023   ALKPHOS 106 12/29/2023   BILITOT 0.5 12/29/2023      RADIOGRAPHY: MM Breast Surgical Specimen Result Date: 01/06/2024 CLINICAL DATA:  Post excision of a left breast lesion following radioactive seed localization. Assess surgical specimen. EXAM: SPECIMEN RADIOGRAPH OF THE LEFT BREAST COMPARISON:  Previous exam(s). FINDINGS: Status post excision of the left breast. The radioactive seed and biopsy marker clip are present, completely  intact, and were marked for pathology. IMPRESSION: Specimen radiograph of the left breast. Electronically Signed   By: Yoali Jungling M.D.   On: 01/06/2024 13:38   MM LT RADIOACTIVE SEED LOC MAMMO GUIDE Result Date: 01/05/2024 CLINICAL DATA:  58 year old with biopsy-proven intermediate grade DCIS involving the outer LEFT breast. Radioactive seed localization is performed in anticipation of lumpectomy which is scheduled for tomorrow. EXAM: MAMMOGRAPHIC GUIDED RADIOACTIVE SEED LOCALIZATION OF THE LEFT BREAST COMPARISON:  Previous exam(s). FINDINGS: Patient presents for radioactive seed localization prior to LEFT breast lumpectomy. I met with the patient and we discussed the procedure of seed localization including benefits and alternatives. We discussed the high likelihood of a successful procedure. We discussed the risks of the procedure including infection, bleeding, tissue injury and further surgery. We discussed the low dose of radioactivity involved in the procedure. Informed, written consent was given. The usual time-out protocol was performed immediately prior to the procedure. Using mammographic guidance, sterile technique with chlorhexidine  as skin antisepsis, 1% lidocaine  as local anesthesia, an I-125 radioactive seed was used to localize the X shaped tissue marking clip and the residual calcifications at the site of biopsy-proven DCIS using a lateral approach. The follow-up mammogram images confirm that the seed is appropriately positioned immediately adjacent to the X clip; residual calcifications extend approximately 0.7 cm anterior to the seed. The images are marked for Dr. Cherlynn Cornfield. Follow-up survey of the patient confirms the presence of the radioactive seed. Order number of I-125 seed: [id number] Total activity: 0.254 mCi Reference Date: 10/14/2023 The patient tolerated the procedure well without apparent immediate complications. She was [facility] from the [facility] with instructions regarding seed  removal. IMPRESSION: Radioactive seed localization of biopsy-proven DCIS involving the outer LEFT breast. Electronically Signed   By: Rinda Cheers M.D.   On: 01/05/2024 15:06       IMPRESSION/PLAN: 1. Intermediate-High Grade, ER positive DCIS of the left breast. Dr. Jeryl Moris has reviewed her final pathology findings and today we discussed the nature of noninvasive  breast disease.  She has done well since surgery and is ready to proceed with external radiotherapy to the breast  to reduce risks of local recurrence. Dr. Arno Bibles anticipates adjuvant antiestrogen therapy to follow. We discussed the risks, benefits, short, and long term effects  of radiotherapy, as well as the curative intent, and the patient is interested in proceeding.  I discussed the delivery and logistics of radiotherapy and Dr. Jeryl Moris recommends 4 weeks of radiotherapy to the left breast with deep inspiration breath hold technique. Written consent is obtained and placed in the chart, a copy was provided to the patient.  She will simulate today. 2. Meniscal injury. The patient will continue her PT and we will follow this expectantly.   In a visit lasting 45 minutes, greater than 50% of the time was spent face to face discussing the patient's condition, in preparation for the discussion, and coordinating the patient's care.     Shelvia Dick, [facility]    **Disclaimer: This note was dictated with voice recognition software. Similar sounding words can inadvertently be transcribed and this note may contain transcription errors which may not have been corrected upon publication of note.**

## 2024-01-27 NOTE — Addendum Note (Signed)
 Encounter addended by: Espiridion Heft, LPN on: 1/61/0960 9:59 AM  Actions taken: Chief Complaint modified, Flowsheet accepted

## 2024-02-02 ENCOUNTER — Encounter: Payer: Self-pay | Admitting: *Deleted

## 2024-02-02 DIAGNOSIS — D0512 Intraductal carcinoma in situ of left breast: Secondary | ICD-10-CM

## 2024-02-08 ENCOUNTER — Ambulatory Visit
Admission: RE | Admit: 2024-02-08 | Discharge: 2024-02-08 | Disposition: A | Discharge status: Home / Self Care | Source: Ambulatory Visit | Attending: Radiation Oncology | Admitting: Radiation Oncology

## 2024-02-08 DIAGNOSIS — D0512 Intraductal carcinoma in situ of left breast: Secondary | ICD-10-CM | POA: Insufficient documentation

## 2024-02-09 ENCOUNTER — Other Ambulatory Visit: Payer: Self-pay

## 2024-02-09 DIAGNOSIS — D0512 Intraductal carcinoma in situ of left breast: Secondary | ICD-10-CM | POA: Diagnosis not present

## 2024-02-09 LAB — RAD ONC ARIA SESSION SUMMARY
Course Elapsed Days: 0
Plan Fractions Treated to Date: 1
Plan Prescribed Dose Per Fraction: 2.66 Gy
Plan Total Fractions Prescribed: 16
Plan Total Prescribed Dose: 42.56 Gy
Reference Point Dosage Given to Date: 2.66 Gy
Reference Point Session Dosage Given: 2.66 Gy
Session Number: 1

## 2024-02-10 ENCOUNTER — Other Ambulatory Visit: Payer: Self-pay

## 2024-02-10 ENCOUNTER — Ambulatory Visit
Admission: RE | Admit: 2024-02-10 | Discharge: 2024-02-10 | Disposition: A | Discharge status: Home / Self Care | Source: Ambulatory Visit | Attending: Radiation Oncology | Admitting: Radiation Oncology

## 2024-02-10 DIAGNOSIS — D0512 Intraductal carcinoma in situ of left breast: Secondary | ICD-10-CM | POA: Diagnosis not present

## 2024-02-10 LAB — RAD ONC ARIA SESSION SUMMARY
Course Elapsed Days: 1
Plan Fractions Treated to Date: 2
Plan Prescribed Dose Per Fraction: 2.66 Gy
Plan Total Fractions Prescribed: 16
Plan Total Prescribed Dose: 42.56 Gy
Reference Point Dosage Given to Date: 5.32 Gy
Reference Point Session Dosage Given: 2.66 Gy
Session Number: 2

## 2024-02-11 ENCOUNTER — Other Ambulatory Visit: Payer: Self-pay

## 2024-02-11 ENCOUNTER — Ambulatory Visit
Admission: RE | Admit: 2024-02-11 | Discharge: 2024-02-11 | Disposition: A | Discharge status: Home / Self Care | Source: Ambulatory Visit | Attending: Radiation Oncology | Admitting: Radiation Oncology

## 2024-02-11 ENCOUNTER — Ambulatory Visit
Admission: RE | Admit: 2024-02-11 | Discharge: 2024-02-11 | Disposition: A | Discharge status: Home / Self Care | Source: Ambulatory Visit | Attending: Radiation Oncology

## 2024-02-11 DIAGNOSIS — D0512 Intraductal carcinoma in situ of left breast: Secondary | ICD-10-CM | POA: Diagnosis not present

## 2024-02-11 LAB — RAD ONC ARIA SESSION SUMMARY
Course Elapsed Days: 2
Plan Fractions Treated to Date: 3
Plan Prescribed Dose Per Fraction: 2.66 Gy
Plan Total Fractions Prescribed: 16
Plan Total Prescribed Dose: 42.56 Gy
Reference Point Dosage Given to Date: 7.98 Gy
Reference Point Session Dosage Given: 2.66 Gy
Session Number: 3

## 2024-02-11 MED ORDER — RADIAPLEXRX EX GEL: Freq: Once | CUTANEOUS | Status: AC

## 2024-02-11 MED ORDER — ALRA NON-METALLIC DEODORANT (RAD-ONC)
1.0000 | Freq: Once | TOPICAL | Status: AC
  Administered 2024-02-11: 1 via TOPICAL

## 2024-02-13 ENCOUNTER — Ambulatory Visit

## 2024-02-14 ENCOUNTER — Other Ambulatory Visit: Payer: Self-pay

## 2024-02-14 ENCOUNTER — Ambulatory Visit
Admission: RE | Admit: 2024-02-14 | Discharge: 2024-02-14 | Disposition: A | Discharge status: Home / Self Care | Source: Ambulatory Visit | Attending: Radiation Oncology

## 2024-02-14 DIAGNOSIS — D0512 Intraductal carcinoma in situ of left breast: Secondary | ICD-10-CM | POA: Diagnosis not present

## 2024-02-14 LAB — RAD ONC ARIA SESSION SUMMARY
Course Elapsed Days: 5
Plan Fractions Treated to Date: 4
Plan Prescribed Dose Per Fraction: 2.66 Gy
Plan Total Fractions Prescribed: 16
Plan Total Prescribed Dose: 42.56 Gy
Reference Point Dosage Given to Date: 10.64 Gy
Reference Point Session Dosage Given: 2.66 Gy
Session Number: 4

## 2024-02-15 ENCOUNTER — Ambulatory Visit
Admission: RE | Admit: 2024-02-15 | Discharge: 2024-02-15 | Disposition: A | Discharge status: Home / Self Care | Source: Ambulatory Visit | Attending: Radiation Oncology

## 2024-02-15 ENCOUNTER — Other Ambulatory Visit: Payer: Self-pay

## 2024-02-15 DIAGNOSIS — D0512 Intraductal carcinoma in situ of left breast: Secondary | ICD-10-CM | POA: Diagnosis not present

## 2024-02-15 LAB — RAD ONC ARIA SESSION SUMMARY
Course Elapsed Days: 6
Plan Fractions Treated to Date: 5
Plan Prescribed Dose Per Fraction: 2.66 Gy
Plan Total Fractions Prescribed: 16
Plan Total Prescribed Dose: 42.56 Gy
Reference Point Dosage Given to Date: 13.3 Gy
Reference Point Session Dosage Given: 2.66 Gy
Session Number: 5

## 2024-02-16 ENCOUNTER — Ambulatory Visit
Admission: RE | Admit: 2024-02-16 | Discharge: 2024-02-16 | Disposition: A | Discharge status: Home / Self Care | Source: Ambulatory Visit | Attending: Radiation Oncology | Admitting: Radiation Oncology

## 2024-02-16 ENCOUNTER — Telehealth: Payer: Self-pay

## 2024-02-16 ENCOUNTER — Other Ambulatory Visit: Payer: Self-pay

## 2024-02-16 DIAGNOSIS — D0512 Intraductal carcinoma in situ of left breast: Secondary | ICD-10-CM | POA: Diagnosis not present

## 2024-02-16 LAB — RAD ONC ARIA SESSION SUMMARY
Course Elapsed Days: 7
Plan Fractions Treated to Date: 6
Plan Prescribed Dose Per Fraction: 2.66 Gy
Plan Total Fractions Prescribed: 16
Plan Total Prescribed Dose: 42.56 Gy
Reference Point Dosage Given to Date: 15.96 Gy
Reference Point Session Dosage Given: 2.66 Gy
Session Number: 6

## 2024-02-16 NOTE — Telephone Encounter (Signed)
 LVM for pt regarding her disability forms being completed, faxed, and confirmation received. No questions or concerns at this time. I mailed pt hard copy to her address.

## 2024-02-17 ENCOUNTER — Other Ambulatory Visit: Payer: Self-pay

## 2024-02-17 ENCOUNTER — Ambulatory Visit
Admission: RE | Admit: 2024-02-17 | Discharge: 2024-02-17 | Disposition: A | Discharge status: Home / Self Care | Source: Ambulatory Visit | Attending: Radiation Oncology | Admitting: Radiation Oncology

## 2024-02-17 DIAGNOSIS — D0512 Intraductal carcinoma in situ of left breast: Secondary | ICD-10-CM | POA: Diagnosis not present

## 2024-02-17 LAB — RAD ONC ARIA SESSION SUMMARY
Course Elapsed Days: 8
Plan Fractions Treated to Date: 7
Plan Prescribed Dose Per Fraction: 2.66 Gy
Plan Total Fractions Prescribed: 16
Plan Total Prescribed Dose: 42.56 Gy
Reference Point Dosage Given to Date: 18.62 Gy
Reference Point Session Dosage Given: 2.66 Gy
Session Number: 7

## 2024-02-18 ENCOUNTER — Ambulatory Visit
Admission: RE | Admit: 2024-02-18 | Discharge: 2024-02-18 | Disposition: A | Discharge status: Home / Self Care | Source: Ambulatory Visit | Attending: Radiation Oncology | Admitting: Radiation Oncology

## 2024-02-18 ENCOUNTER — Other Ambulatory Visit: Payer: Self-pay

## 2024-02-18 DIAGNOSIS — D0512 Intraductal carcinoma in situ of left breast: Secondary | ICD-10-CM | POA: Diagnosis not present

## 2024-02-18 LAB — RAD ONC ARIA SESSION SUMMARY
Course Elapsed Days: 9
Plan Fractions Treated to Date: 8
Plan Prescribed Dose Per Fraction: 2.66 Gy
Plan Total Fractions Prescribed: 16
Plan Total Prescribed Dose: 42.56 Gy
Reference Point Dosage Given to Date: 21.28 Gy
Reference Point Session Dosage Given: 2.66 Gy
Session Number: 8

## 2024-02-21 ENCOUNTER — Other Ambulatory Visit: Payer: Self-pay

## 2024-02-21 ENCOUNTER — Ambulatory Visit
Admission: RE | Admit: 2024-02-21 | Discharge: 2024-02-21 | Disposition: A | Discharge status: Home / Self Care | Source: Ambulatory Visit | Attending: Radiation Oncology | Admitting: Radiation Oncology

## 2024-02-21 DIAGNOSIS — D0512 Intraductal carcinoma in situ of left breast: Secondary | ICD-10-CM | POA: Diagnosis not present

## 2024-02-21 LAB — RAD ONC ARIA SESSION SUMMARY
Course Elapsed Days: 12
Plan Fractions Treated to Date: 9
Plan Prescribed Dose Per Fraction: 2.66 Gy
Plan Total Fractions Prescribed: 16
Plan Total Prescribed Dose: 42.56 Gy
Reference Point Dosage Given to Date: 23.94 Gy
Reference Point Session Dosage Given: 2.66 Gy
Session Number: 9

## 2024-02-22 ENCOUNTER — Ambulatory Visit
Admission: RE | Admit: 2024-02-22 | Discharge: 2024-02-22 | Disposition: A | Discharge status: Home / Self Care | Source: Ambulatory Visit | Attending: Radiation Oncology | Admitting: Radiation Oncology

## 2024-02-22 ENCOUNTER — Other Ambulatory Visit: Payer: Self-pay

## 2024-02-22 DIAGNOSIS — D0512 Intraductal carcinoma in situ of left breast: Secondary | ICD-10-CM | POA: Diagnosis not present

## 2024-02-22 LAB — RAD ONC ARIA SESSION SUMMARY
Course Elapsed Days: 13
Plan Fractions Treated to Date: 10
Plan Prescribed Dose Per Fraction: 2.66 Gy
Plan Total Fractions Prescribed: 16
Plan Total Prescribed Dose: 42.56 Gy
Reference Point Dosage Given to Date: 26.6 Gy
Reference Point Session Dosage Given: 2.66 Gy
Session Number: 10

## 2024-02-23 ENCOUNTER — Other Ambulatory Visit: Payer: Self-pay

## 2024-02-23 ENCOUNTER — Ambulatory Visit
Admission: RE | Admit: 2024-02-23 | Discharge: 2024-02-23 | Disposition: A | Discharge status: Home / Self Care | Source: Ambulatory Visit | Attending: Radiation Oncology | Admitting: Radiation Oncology

## 2024-02-23 DIAGNOSIS — D0512 Intraductal carcinoma in situ of left breast: Secondary | ICD-10-CM | POA: Diagnosis not present

## 2024-02-23 LAB — RAD ONC ARIA SESSION SUMMARY
Course Elapsed Days: 14
Plan Fractions Treated to Date: 11
Plan Prescribed Dose Per Fraction: 2.66 Gy
Plan Total Fractions Prescribed: 16
Plan Total Prescribed Dose: 42.56 Gy
Reference Point Dosage Given to Date: 29.26 Gy
Reference Point Session Dosage Given: 2.66 Gy
Session Number: 11

## 2024-02-24 ENCOUNTER — Other Ambulatory Visit: Payer: Self-pay

## 2024-02-24 ENCOUNTER — Ambulatory Visit
Admission: RE | Admit: 2024-02-24 | Discharge: 2024-02-24 | Disposition: A | Discharge status: Home / Self Care | Source: Ambulatory Visit | Attending: Radiation Oncology | Admitting: Radiation Oncology

## 2024-02-24 DIAGNOSIS — D0512 Intraductal carcinoma in situ of left breast: Secondary | ICD-10-CM | POA: Diagnosis not present

## 2024-02-24 LAB — RAD ONC ARIA SESSION SUMMARY
Course Elapsed Days: 15
Plan Fractions Treated to Date: 12
Plan Prescribed Dose Per Fraction: 2.66 Gy
Plan Total Fractions Prescribed: 16
Plan Total Prescribed Dose: 42.56 Gy
Reference Point Dosage Given to Date: 31.92 Gy
Reference Point Session Dosage Given: 2.66 Gy
Session Number: 12

## 2024-02-25 ENCOUNTER — Ambulatory Visit
Admission: RE | Admit: 2024-02-25 | Discharge: 2024-02-25 | Disposition: A | Discharge status: Home / Self Care | Source: Ambulatory Visit | Attending: Radiation Oncology | Admitting: Radiation Oncology

## 2024-02-25 ENCOUNTER — Ambulatory Visit: Admitting: Radiation Oncology

## 2024-02-25 ENCOUNTER — Other Ambulatory Visit: Payer: Self-pay

## 2024-02-25 DIAGNOSIS — D0512 Intraductal carcinoma in situ of left breast: Secondary | ICD-10-CM | POA: Diagnosis not present

## 2024-02-25 LAB — RAD ONC ARIA SESSION SUMMARY
Course Elapsed Days: 16
Plan Fractions Treated to Date: 13
Plan Prescribed Dose Per Fraction: 2.66 Gy
Plan Total Fractions Prescribed: 16
Plan Total Prescribed Dose: 42.56 Gy
Reference Point Dosage Given to Date: 34.58 Gy
Reference Point Session Dosage Given: 2.66 Gy
Session Number: 13

## 2024-02-25 MED ORDER — RADIAPLEXRX EX GEL: Freq: Once | CUTANEOUS | Status: AC

## 2024-02-29 ENCOUNTER — Other Ambulatory Visit: Payer: Self-pay

## 2024-02-29 ENCOUNTER — Ambulatory Visit
Admission: RE | Admit: 2024-02-29 | Discharge: 2024-02-29 | Disposition: A | Discharge status: Home / Self Care | Source: Ambulatory Visit | Attending: Radiation Oncology

## 2024-02-29 DIAGNOSIS — D0512 Intraductal carcinoma in situ of left breast: Secondary | ICD-10-CM | POA: Diagnosis not present

## 2024-02-29 LAB — RAD ONC ARIA SESSION SUMMARY
Course Elapsed Days: 20
Plan Fractions Treated to Date: 14
Plan Prescribed Dose Per Fraction: 2.66 Gy
Plan Total Fractions Prescribed: 16
Plan Total Prescribed Dose: 42.56 Gy
Reference Point Dosage Given to Date: 37.24 Gy
Reference Point Session Dosage Given: 2.66 Gy
Session Number: 14

## 2024-03-01 ENCOUNTER — Ambulatory Visit
Admission: RE | Admit: 2024-03-01 | Discharge: 2024-03-01 | Disposition: A | Discharge status: Home / Self Care | Source: Ambulatory Visit | Attending: Radiation Oncology

## 2024-03-01 ENCOUNTER — Other Ambulatory Visit: Payer: Self-pay

## 2024-03-01 DIAGNOSIS — D0512 Intraductal carcinoma in situ of left breast: Secondary | ICD-10-CM | POA: Diagnosis not present

## 2024-03-01 LAB — RAD ONC ARIA SESSION SUMMARY
Course Elapsed Days: 21
Plan Fractions Treated to Date: 15
Plan Prescribed Dose Per Fraction: 2.66 Gy
Plan Total Fractions Prescribed: 16
Plan Total Prescribed Dose: 42.56 Gy
Reference Point Dosage Given to Date: 39.9 Gy
Reference Point Session Dosage Given: 2.66 Gy
Session Number: 15

## 2024-03-02 ENCOUNTER — Ambulatory Visit
Admission: RE | Admit: 2024-03-02 | Discharge: 2024-03-02 | Disposition: A | Discharge status: Home / Self Care | Source: Ambulatory Visit | Attending: Radiation Oncology | Admitting: Radiation Oncology

## 2024-03-02 ENCOUNTER — Other Ambulatory Visit: Payer: Self-pay

## 2024-03-02 ENCOUNTER — Ambulatory Visit: Admitting: Radiation Oncology

## 2024-03-02 DIAGNOSIS — D0512 Intraductal carcinoma in situ of left breast: Secondary | ICD-10-CM | POA: Diagnosis not present

## 2024-03-02 LAB — RAD ONC ARIA SESSION SUMMARY
Course Elapsed Days: 22
Plan Fractions Treated to Date: 16
Plan Prescribed Dose Per Fraction: 2.66 Gy
Plan Total Fractions Prescribed: 16
Plan Total Prescribed Dose: 42.56 Gy
Reference Point Dosage Given to Date: 42.56 Gy
Reference Point Session Dosage Given: 2.66 Gy
Session Number: 16

## 2024-03-03 ENCOUNTER — Ambulatory Visit
Admission: RE | Admit: 2024-03-03 | Discharge: 2024-03-03 | Disposition: A | Discharge status: Home / Self Care | Source: Ambulatory Visit | Attending: Radiation Oncology | Admitting: Radiation Oncology

## 2024-03-03 ENCOUNTER — Other Ambulatory Visit: Payer: Self-pay

## 2024-03-03 DIAGNOSIS — D0512 Intraductal carcinoma in situ of left breast: Secondary | ICD-10-CM | POA: Diagnosis not present

## 2024-03-03 LAB — RAD ONC ARIA SESSION SUMMARY
Course Elapsed Days: 23
Plan Fractions Treated to Date: 1
Plan Prescribed Dose Per Fraction: 2 Gy
Plan Total Fractions Prescribed: 4
Plan Total Prescribed Dose: 8 Gy
Reference Point Dosage Given to Date: 2 Gy
Reference Point Session Dosage Given: 2 Gy
Session Number: 17

## 2024-03-06 ENCOUNTER — Other Ambulatory Visit: Payer: Self-pay

## 2024-03-06 ENCOUNTER — Ambulatory Visit
Admission: RE | Admit: 2024-03-06 | Discharge: 2024-03-06 | Disposition: A | Discharge status: Home / Self Care | Source: Ambulatory Visit | Attending: Radiation Oncology | Admitting: Radiation Oncology

## 2024-03-06 DIAGNOSIS — D0512 Intraductal carcinoma in situ of left breast: Secondary | ICD-10-CM | POA: Diagnosis present

## 2024-03-06 LAB — RAD ONC ARIA SESSION SUMMARY
Course Elapsed Days: 26
Plan Fractions Treated to Date: 2
Plan Prescribed Dose Per Fraction: 2 Gy
Plan Total Fractions Prescribed: 4
Plan Total Prescribed Dose: 8 Gy
Reference Point Dosage Given to Date: 4 Gy
Reference Point Session Dosage Given: 2 Gy
Session Number: 18

## 2024-03-07 ENCOUNTER — Ambulatory Visit
Admission: RE | Admit: 2024-03-07 | Discharge: 2024-03-07 | Disposition: A | Discharge status: Home / Self Care | Source: Ambulatory Visit | Attending: Radiation Oncology | Admitting: Radiation Oncology

## 2024-03-07 ENCOUNTER — Other Ambulatory Visit: Payer: Self-pay

## 2024-03-07 DIAGNOSIS — D0512 Intraductal carcinoma in situ of left breast: Secondary | ICD-10-CM | POA: Diagnosis not present

## 2024-03-07 LAB — RAD ONC ARIA SESSION SUMMARY
Course Elapsed Days: 27
Plan Fractions Treated to Date: 3
Plan Prescribed Dose Per Fraction: 2 Gy
Plan Total Fractions Prescribed: 4
Plan Total Prescribed Dose: 8 Gy
Reference Point Dosage Given to Date: 6 Gy
Reference Point Session Dosage Given: 2 Gy
Session Number: 19

## 2024-03-08 ENCOUNTER — Ambulatory Visit
Admission: RE | Admit: 2024-03-08 | Discharge: 2024-03-08 | Disposition: A | Discharge status: Home / Self Care | Source: Ambulatory Visit | Attending: Radiation Oncology | Admitting: Radiation Oncology

## 2024-03-08 ENCOUNTER — Other Ambulatory Visit: Payer: Self-pay

## 2024-03-08 DIAGNOSIS — D0512 Intraductal carcinoma in situ of left breast: Secondary | ICD-10-CM | POA: Diagnosis not present

## 2024-03-08 LAB — RAD ONC ARIA SESSION SUMMARY
Course Elapsed Days: 28
Plan Fractions Treated to Date: 4
Plan Prescribed Dose Per Fraction: 2 Gy
Plan Total Fractions Prescribed: 4
Plan Total Prescribed Dose: 8 Gy
Reference Point Dosage Given to Date: 8 Gy
Reference Point Session Dosage Given: 2 Gy
Session Number: 20

## 2024-03-09 ENCOUNTER — Ambulatory Visit

## 2024-03-09 NOTE — Radiation Completion Notes (Addendum)
  Radiation Oncology         [phone] ________________________________  Name: Cheyenne Harris MRN: [id number]  Date of Service: 03/08/2024  DOB: 1966/04/10  End of Treatment Note   Diagnosis: Intermediate-High Grade, ER positive DCIS of the left breast.   Intent: Curative     ==========DELIVERED PLANS==========  First Treatment Date: 2024-02-09 Last Treatment Date: 2024-03-08   Plan Name: Breast_L_BH Site: Breast, Left Technique: 3D Mode: Photon Dose Per Fraction: 2.66 Gy Prescribed Dose (Delivered / Prescribed): 42.56 Gy / 42.56 Gy Prescribed Fxs (Delivered / Prescribed): 16 / 16   Plan Name: Brst_L_Bst_BH Site: Breast, Left Technique: 3D Mode: Photon Dose Per Fraction: 2 Gy Prescribed Dose (Delivered / Prescribed): 8 Gy / 8 Gy Prescribed Fxs (Delivered / Prescribed): 4 / 4     ==========ON TREATMENT VISIT DATES========== 2024-02-11, 2024-02-18, 2024-02-25, 2024-03-03   See weekly On Treatment Notes in Epic for details in the Media tab (listed as Progress notes on the On Treatment Visit Dates listed above). The patient tolerated radiation. She developed fatigue and anticipated skin changes in the treatment field.   The patient will receive a call in about one month from the radiation oncology department. She will continue follow up with Dr. Arno Bibles as well.      Shelvia Dick, PAC

## 2024-03-23 ENCOUNTER — Other Ambulatory Visit: Payer: Self-pay | Admitting: Family Medicine

## 2024-04-06 NOTE — Progress Notes (Signed)
  Radiation Oncology         [phone] ________________________________  Name: Cheyenne Harris MRN: [id number]  Date of Service: 04/10/2024  DOB: Nov 06, 1965  Post Treatment Telephone Note  Diagnosis:   Intermediate-High Grade, ER positive DCIS of the left breast. (as documented in provider EOT note)  The patient was available for call today.   Symptoms of fatigue have improved since completing therapy.  Symptoms of skin changes have improved since completing therapy.  The patient was encouraged to avoid sun exposure in the area of prior treatment for up to one year following radiation with either sunscreen or by the style of clothing worn in the sun.  The patient has scheduled follow up with her medical oncologist Dr. Loretha for ongoing surveillance, and was encouraged to call if she develops concerns or questions regarding radiation.   This concludes the interaction.  Rosaline Minerva, LPN

## 2024-04-10 ENCOUNTER — Ambulatory Visit
Admission: RE | Admit: 2024-04-10 | Discharge: 2024-04-10 | Disposition: A | Discharge status: Home / Self Care | Source: Ambulatory Visit | Attending: Hematology and Oncology | Admitting: Hematology and Oncology

## 2024-04-10 DIAGNOSIS — D0512 Intraductal carcinoma in situ of left breast: Secondary | ICD-10-CM | POA: Insufficient documentation

## 2024-05-05 ENCOUNTER — Encounter: Payer: Self-pay | Admitting: Family Medicine

## 2024-05-05 ENCOUNTER — Ambulatory Visit (INDEPENDENT_AMBULATORY_CARE_PROVIDER_SITE_OTHER): Admitting: Family Medicine

## 2024-05-05 VITALS — BP 113/72 | HR 82 | Ht 64.0 in | Wt 151.5 lb

## 2024-05-05 DIAGNOSIS — I10 Essential (primary) hypertension: Secondary | ICD-10-CM

## 2024-05-05 DIAGNOSIS — E663 Overweight: Secondary | ICD-10-CM

## 2024-05-05 DIAGNOSIS — J3089 Other allergic rhinitis: Secondary | ICD-10-CM

## 2024-05-05 DIAGNOSIS — E559 Vitamin D deficiency, unspecified: Secondary | ICD-10-CM

## 2024-05-05 DIAGNOSIS — E785 Hyperlipidemia, unspecified: Secondary | ICD-10-CM | POA: Diagnosis not present

## 2024-05-05 DIAGNOSIS — R6882 Decreased libido: Secondary | ICD-10-CM | POA: Insufficient documentation

## 2024-05-05 DIAGNOSIS — D0512 Intraductal carcinoma in situ of left breast: Secondary | ICD-10-CM | POA: Diagnosis not present

## 2024-05-05 NOTE — Assessment & Plan Note (Signed)
 Noted since dx and treatment of breast cancer with radiation, will discuss further with Oncology, encouraged her to work with her spouse as this is likely s/e of her recent treatment and to continuwe good health habits

## 2024-05-05 NOTE — Progress Notes (Signed)
 Cheyenne Harris     MRN: [id number]      DOB: 21-Mar-1966  Chief Complaint  Patient presents with   Hypertension   Hyperlipidemia    HPI Cheyenne Harris is here for follow up and re-evaluation of chronic medical conditions, medication management and review of any available recent lab and radiology data.  Preventive health is updated, specifically  Cancer screening and Immunization.   Cheyenne Harris has successfully completed 1 month of radiation treatment for breast cancer and is looking to retiring in the next month.  She states her main side effect was fatigue generally she did well.  She does have a new concern that her sex drive is reduced and anticipates having to start tablets that will reduce his even more and these are concerned she is voicing.  She is also wanting to know when she will actually start the oral medication that she thinks is being planned for her she does have an upcoming appointment with the navigator and I have also encouraged her to send a message with these questions since they are on her mind.  She has been maintaining an excellent lifestyle exercising 5 days a week, getting at least 8 hours of good rest, and has a healthy food choices.  She is also committed to taking medications as prescribed.  She is holding off on vaccines that are being recommended at this time. ROS Denies recent fever or chills. Denies sinus pressure, nasal congestion, ear pain or sore throat. Denies chest congestion, productive cough or wheezing. Denies chest pains, palpitations and leg swelling Denies abdominal pain, nausea, vomiting,diarrhea or constipation.   Denies dysuria, frequency, hesitancy or incontinence. Denies joint pain, swelling and limitation in mobility. Denies headaches, seizures, numbness, or tingling. Denies depression, anxiety or insomnia. Denies skin break down or rash.   PE  BP 113/72   Pulse 82   Wt 151 lb 8 oz (68.7 kg)   LMP 10/11/2014 Comment: hysterectomy   SpO2 98%   BMI 26.00 kg/m   Patient alert and oriented and in no cardiopulmonary distress.  HEENT: No facial asymmetry, EOMI,     Neck supple .  Chest: Clear to auscultation bilaterally.  CVS: S1, S2 no murmurs, no S3.Regular rate.  ABD: Soft non tender.   Ext: No edema  MS: Adequate ROM spine, shoulders, hips and knees.  Skin: Intact, no ulcerations or rash noted.  Psych: Good eye contact, normal affect. Memory intact not anxious or depressed appearing.  CNS: CN 2-12 intact, power,  normal throughout.no focal deficits noted.   Assessment & Plan  Ductal carcinoma in situ (DCIS) of left breast Completed radiaition treatment in 03/2024, has upcoming appt with oncology navigator, concern re starting t medication, question as to whemn , also notes reduced libido  Allergic rhinitis Controlled, no change in medication   Essential hypertension Controlled, no change in medication DASH diet and commitment to daily physical activity for a minimum of 30 minutes discussed and encouraged, as a part of hypertension management. The importance of attaining a healthy weight is also discussed.     05/05/2024    8:07 AM 01/27/2024    7:34 AM 01/06/2024    3:15 PM 01/06/2024    3:00 PM 01/06/2024    2:45 PM 01/06/2024    2:30 PM 01/06/2024    2:15 PM  BP/Weight  Systolic BP 113 132 125 122 122 129 131  Diastolic BP 72 70 80 79 79 89 86  Wt. (Lbs) 151.5 153.25  BMI 26 kg/m2 26.31 kg/m2            Hyperlipidemia LDL goal <100 Hyperlipidemia:Low fat diet discussed and encouraged.   Lipid Panel  Lab Results  Component Value Date   CHOL 169 07/12/2023   HDL 61 07/12/2023   LDLCALC 97 07/12/2023   TRIG 53 07/12/2023   CHOLHDL 2.8 07/12/2023     Updated lab needed at/ before next visit.   Overweight (BMI 25.0-29.9)  Patient re-educated about  the importance of commitment to a  minimum of 150 minutes of exercise per week as able.  The importance of healthy food choices  with portion control discussed, as well as eating regularly and within a 12 hour window most days. The need to choose clean , green food 50 to 75% of the time is discussed, as well as to make water the primary drink and set a goal of 64 ounces water daily.       05/05/2024    8:07 AM 01/27/2024    7:34 AM 01/06/2024   11:35 AM  Weight /BMI  Weight 151 lb 8 oz 153 lb 4 oz 147 lb 0.8 oz  Height  5' 4 (1.626 m) 5' 4 (1.626 m)  BMI 26 kg/m2 26.31 kg/m2 25.24 kg/m2      Decreased libido Noted since dx and treatment of breast cancer with radiation, will discuss further with Oncology, encouraged her to work with her spouse as this is likely s/e of her recent treatment and to continuwe good health habits

## 2024-05-05 NOTE — Patient Instructions (Signed)
 Annual exam in November  Fasting CBC lipid CMP and EGFR TSH and vitamin D  to be drawn next week.  Blood pressure is excellent no change in medication.Exam is normal  Immunization needs to be started.  Thankful that you have done extremely well with your treatments please reach out to the navigator with your specific questions and concerns.  Keep up good health habits and all the best with your retirement.  Thanks for choosing [facility], we consider it a privelige to serve you.

## 2024-05-05 NOTE — Assessment & Plan Note (Signed)
 Hyperlipidemia:Low fat diet discussed and encouraged.   Lipid Panel  Lab Results  Component Value Date   CHOL 169 07/12/2023   HDL 61 07/12/2023   LDLCALC 97 07/12/2023   TRIG 53 07/12/2023   CHOLHDL 2.8 07/12/2023     Updated lab needed at/ before next visit.

## 2024-05-05 NOTE — Assessment & Plan Note (Signed)
 Controlled, no change in medication DASH diet and commitment to daily physical activity for a minimum of 30 minutes discussed and encouraged, as a part of hypertension management. The importance of attaining a healthy weight is also discussed.     05/05/2024    8:07 AM 01/27/2024    7:34 AM 01/06/2024    3:15 PM 01/06/2024    3:00 PM 01/06/2024    2:45 PM 01/06/2024    2:30 PM 01/06/2024    2:15 PM  BP/Weight  Systolic BP 113 132 125 122 122 129 131  Diastolic BP 72 70 80 79 79 89 86  Wt. (Lbs) 151.5 153.25       BMI 26 kg/m2 26.31 kg/m2

## 2024-05-05 NOTE — Assessment & Plan Note (Signed)
 Controlled, no change in medication

## 2024-05-05 NOTE — Assessment & Plan Note (Signed)
  Patient re-educated about  the importance of commitment to a  minimum of 150 minutes of exercise per week as able.  The importance of healthy food choices with portion control discussed, as well as eating regularly and within a 12 hour window most days. The need to choose clean , green food 50 to 75% of the time is discussed, as well as to make water the primary drink and set a goal of 64 ounces water daily.       05/05/2024    8:07 AM 01/27/2024    7:34 AM 01/06/2024   11:35 AM  Weight /BMI  Weight 151 lb 8 oz 153 lb 4 oz 147 lb 0.8 oz  Height  5' 4 (1.626 m) 5' 4 (1.626 m)  BMI 26 kg/m2 26.31 kg/m2 25.24 kg/m2

## 2024-05-05 NOTE — Assessment & Plan Note (Signed)
 Completed radiaition treatment in 03/2024, has upcoming appt with oncology navigator, concern re starting t medication, question as to whemn , also notes reduced libido

## 2024-05-08 ENCOUNTER — Other Ambulatory Visit: Payer: Self-pay

## 2024-05-08 DIAGNOSIS — E785 Hyperlipidemia, unspecified: Secondary | ICD-10-CM

## 2024-05-08 DIAGNOSIS — E559 Vitamin D deficiency, unspecified: Secondary | ICD-10-CM

## 2024-05-08 DIAGNOSIS — I10 Essential (primary) hypertension: Secondary | ICD-10-CM

## 2024-05-08 DIAGNOSIS — E663 Overweight: Secondary | ICD-10-CM

## 2024-05-09 ENCOUNTER — Encounter: Payer: Self-pay | Admitting: Hematology and Oncology

## 2024-05-09 ENCOUNTER — Telehealth: Payer: Self-pay | Admitting: *Deleted

## 2024-05-09 ENCOUNTER — Ambulatory Visit: Payer: Self-pay | Admitting: Family Medicine

## 2024-05-09 LAB — CMP14+EGFR
ALT: 15 IU/L (ref 0–32)
AST: 16 IU/L (ref 0–40)
Albumin: 4.3 g/dL (ref 3.8–4.9)
Alkaline Phosphatase: 109 IU/L (ref 44–121)
BUN/Creatinine Ratio: 18 (ref 9–23)
BUN: 15 mg/dL (ref 6–24)
Bilirubin Total: 0.5 mg/dL (ref 0.0–1.2)
CO2: 22 mmol/L (ref 20–29)
Calcium: 9.8 mg/dL (ref 8.7–10.2)
Chloride: 104 mmol/L (ref 96–106)
Creatinine, Ser: 0.83 mg/dL (ref 0.57–1.00)
Globulin, Total: 2.4 g/dL (ref 1.5–4.5)
Glucose: 84 mg/dL (ref 70–99)
Potassium: 4.1 mmol/L (ref 3.5–5.2)
Sodium: 141 mmol/L (ref 134–144)
Total Protein: 6.7 g/dL (ref 6.0–8.5)
eGFR: 82 mL/min/1.73 (ref >59)

## 2024-05-09 LAB — LIPID PANEL
Chol/HDL Ratio: 2.8 ratio (ref 0.0–4.4)
Cholesterol, Total: 192 mg/dL (ref 100–199)
HDL: 68 mg/dL (ref >39)
LDL Chol Calc (NIH): 114 mg/dL — ABNORMAL HIGH (ref 0–99)
Triglycerides: 54 mg/dL (ref 0–149)
VLDL Cholesterol Cal: 10 mg/dL (ref 5–40)

## 2024-05-09 LAB — CBC WITH DIFFERENTIAL/PLATELET
Basophils Absolute: 0 x10E3/uL (ref 0.0–0.2)
Basos: 1 %
EOS (ABSOLUTE): 0.1 x10E3/uL (ref 0.0–0.4)
Eos: 2 %
Hematocrit: 42 % (ref 34.0–46.6)
Hemoglobin: 13.4 g/dL (ref 11.1–15.9)
Immature Grans (Abs): 0 x10E3/uL (ref 0.0–0.1)
Immature Granulocytes: 0 %
Lymphocytes Absolute: 1.2 x10E3/uL (ref 0.7–3.1)
Lymphs: 36 %
MCH: 28.3 pg (ref 26.6–33.0)
MCHC: 31.9 g/dL (ref 31.5–35.7)
MCV: 89 fL (ref 79–97)
Monocytes Absolute: 0.3 x10E3/uL (ref 0.1–0.9)
Monocytes: 9 %
Neutrophils Absolute: 1.8 x10E3/uL (ref 1.4–7.0)
Neutrophils: 52 %
Platelets: 199 x10E3/uL (ref 150–450)
RBC: 4.73 x10E6/uL (ref 3.77–5.28)
RDW: 12.7 % (ref 11.7–15.4)
WBC: 3.3 x10E3/uL — ABNORMAL LOW (ref 3.4–10.8)

## 2024-05-09 LAB — VITAMIN D 25 HYDROXY (VIT D DEFICIENCY, FRACTURES): Vit D, 25-Hydroxy: 39.9 ng/mL (ref 30.0–100.0)

## 2024-05-09 LAB — TSH: TSH: 2.21 u[IU]/mL (ref 0.450–4.500)

## 2024-05-09 MED ORDER — ANASTROZOLE 1 MG PO TABS: 1.0000 mg | ORAL_TABLET | Freq: Every day | ORAL | 12 refills | Status: AC

## 2024-05-09 NOTE — Telephone Encounter (Signed)
 This RN spoke with pt per her My Chart message per review by MD with recommendation to proceed to use of anastrozole .  Above medication discussed on mechanism of action and potential side effects.  Pt verbalized understanding and agreement to use,  Pharmacy verified and prescription sent.  Pt is scheduled for follow up in 1 month and understands to call if she has further questions or occurs side effects that she is concerned about.

## 2024-06-06 ENCOUNTER — Inpatient Hospital Stay: Attending: Adult Health | Admitting: Adult Health

## 2024-06-06 ENCOUNTER — Encounter: Payer: Self-pay | Admitting: Adult Health

## 2024-06-06 VITALS — BP 120/67 | HR 79 | Temp 97.2°F | Resp 16 | Wt 153.8 lb

## 2024-06-06 DIAGNOSIS — Z808 Family history of malignant neoplasm of other organs or systems: Secondary | ICD-10-CM | POA: Insufficient documentation

## 2024-06-06 DIAGNOSIS — Z923 Personal history of irradiation: Secondary | ICD-10-CM | POA: Diagnosis not present

## 2024-06-06 DIAGNOSIS — Z90710 Acquired absence of both cervix and uterus: Secondary | ICD-10-CM | POA: Diagnosis not present

## 2024-06-06 DIAGNOSIS — R232 Flushing: Secondary | ICD-10-CM | POA: Insufficient documentation

## 2024-06-06 DIAGNOSIS — Z79899 Other long term (current) drug therapy: Secondary | ICD-10-CM | POA: Diagnosis not present

## 2024-06-06 DIAGNOSIS — Z8051 Family history of malignant neoplasm of kidney: Secondary | ICD-10-CM | POA: Insufficient documentation

## 2024-06-06 DIAGNOSIS — I10 Essential (primary) hypertension: Secondary | ICD-10-CM | POA: Insufficient documentation

## 2024-06-06 DIAGNOSIS — Z8249 Family history of ischemic heart disease and other diseases of the circulatory system: Secondary | ICD-10-CM | POA: Diagnosis not present

## 2024-06-06 DIAGNOSIS — Z9079 Acquired absence of other genital organ(s): Secondary | ICD-10-CM | POA: Insufficient documentation

## 2024-06-06 DIAGNOSIS — Z1721 Progesterone receptor positive status: Secondary | ICD-10-CM | POA: Diagnosis not present

## 2024-06-06 DIAGNOSIS — D0512 Intraductal carcinoma in situ of left breast: Secondary | ICD-10-CM | POA: Diagnosis not present

## 2024-06-06 DIAGNOSIS — Z833 Family history of diabetes mellitus: Secondary | ICD-10-CM | POA: Diagnosis not present

## 2024-06-06 DIAGNOSIS — Z170 Estrogen receptor positive status [ER+]: Secondary | ICD-10-CM | POA: Diagnosis not present

## 2024-06-06 DIAGNOSIS — Z79811 Long term (current) use of aromatase inhibitors: Secondary | ICD-10-CM | POA: Diagnosis not present

## 2024-06-06 DIAGNOSIS — Z882 Allergy status to sulfonamides status: Secondary | ICD-10-CM | POA: Diagnosis not present

## 2024-06-06 DIAGNOSIS — Z803 Family history of malignant neoplasm of breast: Secondary | ICD-10-CM | POA: Diagnosis not present

## 2024-06-06 DIAGNOSIS — Z13820 Encounter for screening for osteoporosis: Secondary | ICD-10-CM | POA: Diagnosis not present

## 2024-06-06 NOTE — Progress Notes (Unsigned)
 SURVIVORSHIP VISIT:  BRIEF ONCOLOGIC HISTORY:  Oncology History  Ductal carcinoma in situ (DCIS) of left breast  12/13/2023 Mammogram   Mammogram showed possible asymmetry and separate calcifications in the left breast. Diagnostic mammogram showed indeterminate 1.2 cm group of calcs in the lower outer left breast.   12/22/2023 Pathology Results   Left breast needle core biopsy showed 1.2 cms group of outer calcs, DCIS, intermediate grade, ER 100%, PR 0%   01/04/2024 Genetic Testing   Negative [facility] CustomNext-Cancer +RNAinsight Panel.  Report date is 01/12/2024.   The [facility] CustomNext-Cancer +RNAinsight Panel (CancerNext + kidney cancer genes) includes sequencing, deletion/duplication, and RNA analysis for the following 44 genes:  APC, ATM, BAP1, BARD1, BMPR1A, BRCA1, BRCA2, BRIP1, CDH1, CDKN2A, CHEK2, FH, FLCN, MAX, MET, MLH1, MSH2, MSH6, MUTYH, NF1, NTHL1, PALB2, PMS2, PTEN, RAD51C, RAD51D, SDHA, SDHB, SDHC, SDHD, SMAD4, STK11, TP53, TSC1, TSC2 and VHL (sequencing and deletion/duplication); AXIN2, HOXB13, MBD4, MSH3, POLD1 and POLE (sequencing only); EPCAM and GREM1 (deletion/duplication only).     02/09/2024 - 03/08/2024 Radiation Therapy   Plan Name: Breast_L_BH Site: Breast, Left Technique: 3D Mode: Photon Dose Per Fraction: 2.66 Gy Prescribed Dose (Delivered / Prescribed): 42.56 Gy / 42.56 Gy Prescribed Fxs (Delivered / Prescribed): 16 / 16   Plan Name: Brst_L_Bst_BH Site: Breast, Left Technique: 3D Mode: Photon Dose Per Fraction: 2 Gy Prescribed Dose (Delivered / Prescribed): 8 Gy / 8 Gy Prescribed Fxs (Delivered / Prescribed): 4 / 4   05/2024 -  Anti-estrogen oral therapy   Anastrozole      INTERVAL HISTORY:  Discussed the use of AI scribe software for clinical note transcription with the patient, who gave verbal consent to proceed.  History of Present Illness Cheyenne Harris is a 58 year old female with a history of left breast ductal carcinoma in situ who presents for a  follow-up on her post-treatment care plan. She is accompanied by her husband, Cheyenne Harris.  She started anastrozole  two to three weeks ago and experiences hot flashes approximately once a week. Her breast cancer was estrogen receptor-positive, and she underwent a lumpectomy without lymph node removal. Skin discoloration at the lumpectomy site improved with cream use but darkened again after discontinuation. Post-radiation therapy side effects include tiredness, skin discoloration, and breast swelling.  She has undergone genetic testing, which was negative. Her mammogram is due in March, and she has not yet completed bone density testing, which is recommended due to her anastrozole  use.   REVIEW OF SYSTEMS:  Review of Systems  Constitutional:  Negative for appetite change, chills, fatigue, fever and unexpected weight change.  HENT:   Negative for hearing loss, lump/mass and trouble swallowing.   Eyes:  Negative for eye problems and icterus.  Respiratory:  Negative for chest tightness, cough and shortness of breath.   Cardiovascular:  Negative for chest pain, leg swelling and palpitations.  Gastrointestinal:  Negative for abdominal distention, abdominal pain, constipation, diarrhea, nausea and vomiting.  Endocrine: Positive for hot flashes.  Genitourinary:  Negative for difficulty urinating.   Musculoskeletal:  Negative for arthralgias.  Skin:  Negative for itching and rash.  Neurological:  Negative for dizziness, extremity weakness, headaches and numbness.  Hematological:  Negative for adenopathy. Does not bruise/bleed easily.  Psychiatric/Behavioral:  Negative for depression. The patient is not nervous/anxious.    Breast: Denies any new nodularity, masses, tenderness, nipple changes, or nipple discharge.       PAST MEDICAL/SURGICAL HISTORY:  Past Medical History:  Diagnosis Date   Complication of anesthesia  Hyperlipidemia    Hypertension    Insomnia due to anxiety and fear 01/05/2024    PONV (postoperative nausea and vomiting)    Past Surgical History:  Procedure Laterality Date   ABDOMINAL HYSTERECTOMY     BILATERAL SALPINGECTOMY Bilateral 03/12/2015   Procedure: BILATERAL SALPINGECTOMY;  Surgeon: Norleen Edsel GAILS, MD;  Location: [facility];  Service: Gynecology;  Laterality: Bilateral;   BREAST BIOPSY Left 12/22/2023   MM LT BREAST BX W LOC DEV 1ST LESION IMAGE BX SPEC STEREO GUIDE 12/22/2023 [facility]   BREAST BIOPSY  01/05/2024   MM LT RADIOACTIVE SEED LOC MAMMO GUIDE 01/05/2024 [facility]   BREAST LUMPECTOMY WITH RADIOACTIVE SEED LOCALIZATION Left 01/06/2024   Procedure: BREAST LUMPECTOMY WITH RADIOACTIVE SEED LOCALIZATION;  Surgeon: Aron Shoulders, MD;  Location: [facility];  Service: General;  Laterality: Left;   COLONOSCOPY N/A 04/13/2018   Procedure: COLONOSCOPY;  Surgeon: Golda Claudis PENNER, MD;  Location: [facility];  Service: Endoscopy;  Laterality: N/A;  730   MENISCUS REPAIR Right 11/24/2023   [facility]   SUPRACERVICAL ABDOMINAL HYSTERECTOMY N/A 03/12/2015   Procedure: HYSTERECTOMY SUPRACERVICAL ABDOMINAL;  Surgeon: Norleen Edsel GAILS, MD;  Location: [facility];  Service: Gynecology;  Laterality: N/A;   WISDOM TOOTH EXTRACTION       ALLERGIES:  Allergies  Allergen Reactions   Sulfa Antibiotics Hives     CURRENT MEDICATIONS:  Outpatient Encounter Medications as of 06/06/2024  Medication Sig   amLODipine  (NORVASC ) 2.5 MG tablet TAKE 1 TABLET BY MOUTH EVERY DAY   amLODipine  (NORVASC ) 5 MG tablet TAKE 1 TABLET (5 MG TOTAL) BY MOUTH DAILY.   anastrozole  (ARIMIDEX ) 1 MG tablet Take 1 tablet (1 mg total) by mouth daily.   azelastine  (ASTELIN ) 0.1 % nasal spray Place 2 sprays into both nostrils 2 (two) times daily. Use in each nostril as directed   cetirizine  (ZYRTEC ) 10 MG tablet Take 10 mg by mouth daily.   polyethylene glycol powder (GLYCOLAX /MIRALAX ) 17 GM/SCOOP powder Take 17 g by mouth in the morning and at bedtime.   rosuvastatin   (CRESTOR ) 5 MG tablet TAKE 1 TABLET (5 MG TOTAL) BY MOUTH DAILY.   No facility-administered encounter medications on file as of 06/06/2024.     ONCOLOGIC FAMILY HISTORY:  Family History  Problem Relation Age of Onset   Hypertension Mother    Throat cancer Father        d. 36   Hypertension Sister    Hypertension Sister    Hypertension Brother    Breast cancer Paternal Aunt        x3 pat aunts; some dx before age 87   Diabetes Maternal Grandmother    Heart disease Maternal Grandmother    Kidney cancer Maternal Grandmother 70   Breast cancer Other        distant maternal cousin w/ breast cancer dx <50     SOCIAL HISTORY:  Social History   Socioeconomic History   Marital status: Married    Spouse name: Not on file   Number of children: Not on file   Years of education: Not on file   Highest education level: Associate degree: occupational, Scientist, product/process development, or vocational program  Occupational History   Not on file  Tobacco Use   Smoking status: Never   Smokeless tobacco: Never  Vaping Use   Vaping status: Never Used  Substance and Sexual Activity   Alcohol use: Yes    Alcohol/week: 0.0 standard drinks of alcohol    Comment: occasionally  Drug use: No   Sexual activity: Yes    Birth control/protection: Surgical  Other Topics Concern   Not on file  Social History Narrative   Not on file   Social Drivers of Health   Financial Resource Strain: Low Risk  (05/01/2024)   Overall Financial Resource Strain (CARDIA)    Difficulty of Paying Living Expenses: Not hard at all  Food Insecurity: No Food Insecurity (05/01/2024)   Hunger Vital Sign    Worried About Running Out of Food in the Last Year: Never true    Ran Out of Food in the Last Year: Never true  Transportation Needs: No Transportation Needs (05/01/2024)   PRAPARE - Administrator, Civil Service (Medical): No    Lack of Transportation (Non-Medical): No  Physical Activity: Sufficiently Active (05/01/2024)    Exercise Vital Sign    Days of Exercise per Week: 5 days    Minutes of Exercise per Session: 40 min  Stress: No Stress Concern Present (05/01/2024)   [employer] of Occupational Health - Occupational Stress Questionnaire    Feeling of Stress: Only a little  Social Connections: Socially Integrated (05/01/2024)   Social Connection and Isolation Panel    Frequency of Communication with Friends and Family: More than three times a week    Frequency of Social Gatherings with Friends and Family: Once a week    Attends Religious Services: More than 4 times per year    Active Member of [employer] or Organizations: Yes    Attends Banker Meetings: 1 to 4 times per year    Marital Status: Married  Catering manager Violence: Not At Risk (01/27/2024)   Humiliation, Afraid, Rape, and Kick questionnaire    Fear of Current or Ex-Partner: No    Emotionally Abused: No    Physically Abused: No    Sexually Abused: No     OBSERVATIONS/OBJECTIVE:  BP 120/67 (BP Location: Left Arm, Patient Position: Sitting)   Pulse 79   Temp (!) 97.2 F (36.2 C) (Temporal)   Resp 16   Wt 153 lb 12.8 oz (69.8 kg)   LMP 10/11/2014 Comment: hysterectomy  SpO2 97%   BMI 26.40 kg/m  GENERAL: Patient is a well appearing female in no acute distress HEENT:  Sclerae anicteric.  Oropharynx clear and moist. No ulcerations or evidence of oropharyngeal candidiasis. Neck is supple.  NODES:  No cervical, supraclavicular, or axillary lymphadenopathy palpated.  BREAST EXAM:  left breast s/p lumpectomy and radiation, no sign of local recurrence, right breast benign LUNGS:  Clear to auscultation bilaterally.  No wheezes or rhonchi. HEART:  Regular rate and rhythm. No murmur appreciated. ABDOMEN:  Soft, nontender.  Positive, normoactive bowel sounds. No organomegaly palpated. MSK:  No focal spinal tenderness to palpation. Full range of motion bilaterally in the upper extremities. EXTREMITIES:  No peripheral edema.    SKIN:  Clear with no obvious rashes or skin changes. No nail dyscrasia. NEURO:  Nonfocal. Well oriented.  Appropriate affect.   LABORATORY DATA:  None for this visit.  DIAGNOSTIC IMAGING:  None for this visit.      ASSESSMENT AND PLAN:  Ms.. Guadarrama is a pleasant 58 y.o. female with Stage 0 left breast DCIS, ER+/PR+, diagnosed in 12/2023, treated with lumpectomy, adjuvant radiation therapy, and anti-estrogen therapy with Anastrozole  beginning in 05/2024.  She presents to the Survivorship Clinic for our initial meeting and routine follow-up post-completion of treatment for breast cancer.    1. Stage 0 left breast cancer:  Ms. Scheff is continuing to recover from definitive treatment for breast cancer. She will follow-up with her medical oncologist, Dr. Loretha in 7 months with history and physical exam per surveillance protocol.  She will continue her anti-estrogen therapy with Anastrozole . Thus far, she is tolerating the Anastrozole  well, with minimal side effects. Her mammogram is due 12/2024; orders placed today.   Today, a comprehensive survivorship care plan and treatment summary was reviewed with the patient today detailing her breast cancer diagnosis, treatment course, potential late/long-term effects of treatment, appropriate follow-up care with recommendations for the future, and patient education resources.  A copy of this summary, along with a letter will be sent to the patient's primary care provider via mail/fax/In Basket message after today's visit.    2. Bone health:  Given Ms. Wedekind's age/history of breast cancer and her current treatment regimen including anti-estrogen therapy with Anastrozole , she is at risk for bone demineralization. She has not undergone bone density testing and this was ordered to occur sometime in the next few weeks. She was given education on specific activities to promote bone health.  3. Cancer screening:  Due to Ms. Creswell's history and her age, she should  receive screening for skin cancers, colon cancer, and gynecologic cancers.  The information and recommendations are listed on the patient's comprehensive care plan/treatment summary and were reviewed in detail with the patient.    4. Health maintenance and wellness promotion: Ms. Urbani was encouraged to consume 5-7 servings of fruits and vegetables per day. We reviewed the Nutrition Rainbow handout.  She was also encouraged to engage in moderate to vigorous exercise for 30 minutes per day most days of the week.  She was instructed to limit her alcohol consumption and continue to abstain from tobacco use.     5. Support services/counseling: It is not uncommon for this period of the patient's cancer care trajectory to be one of many emotions and stressors.   She was given information regarding our available services and encouraged to contact me with any questions or for help enrolling in any of our support group/programs.    Follow up instructions:    -Return to cancer center 01/2025 for f/u with Dr. Loretha -Follow up with Dr. Aron in 07/2024  -Mammogram due in 12/2024 -She is welcome to return back to the Survivorship Clinic at any time; no additional follow-up needed at this time.  -Consider referral back to survivorship as a long-term survivor for continued surveillance  The patient was provided an opportunity to ask questions and all were answered. The patient agreed with the plan and demonstrated an understanding of the instructions.   Total encounter time:45 minutes*in face-to-face visit time, chart review, lab review, care coordination, order entry, and documentation of the encounter time.    Morna Kendall, NP 06/07/24 3:42 PM Medical Oncology and Hematology [facility] [address] [place], [place] [address] Tel. [phone]    Fax. [phone]  *Total Encounter Time as defined by the Centers for Medicare and Medicaid Services includes, in addition to the  face-to-face time of a patient visit (documented in the note above) non-face-to-face time: obtaining and reviewing outside history, ordering and reviewing medications, tests or procedures, care coordination (communications with other health care professionals or caregivers) and documentation in the medical record.

## 2024-06-19 ENCOUNTER — Ambulatory Visit (HOSPITAL_COMMUNITY)
Admission: RE | Admit: 2024-06-19 | Discharge: 2024-06-19 | Disposition: A | Discharge status: Home / Self Care | Source: Ambulatory Visit | Attending: Adult Health | Admitting: Adult Health

## 2024-06-19 DIAGNOSIS — Z780 Asymptomatic menopausal state: Secondary | ICD-10-CM | POA: Diagnosis not present

## 2024-06-19 DIAGNOSIS — Z79811 Long term (current) use of aromatase inhibitors: Secondary | ICD-10-CM | POA: Insufficient documentation

## 2024-06-19 DIAGNOSIS — Z13820 Encounter for screening for osteoporosis: Secondary | ICD-10-CM | POA: Insufficient documentation

## 2024-06-21 ENCOUNTER — Ambulatory Visit: Payer: Self-pay | Admitting: *Deleted

## 2024-06-21 NOTE — Telephone Encounter (Signed)
 Per Morna Kendall, NP, called pt with message below. Left message to call back to schedule phone visit to discuss bone density results.

## 2024-07-03 ENCOUNTER — Inpatient Hospital Stay: Admitting: Adult Health

## 2024-07-03 DIAGNOSIS — D0512 Intraductal carcinoma in situ of left breast: Secondary | ICD-10-CM

## 2024-07-03 NOTE — Patient Instructions (Signed)
 Zoledronic Acid Injection (Bone Disorders) What is this medication? ZOLEDRONIC ACID (ZOE le dron ik AS id) prevents and treats osteoporosis. It may also be used to treat Paget's disease of the bone. It works by Interior and spatial designer stronger and less likely to break (fracture). It belongs to a group of medications called bisphosphonates. This medicine may be used for other purposes; ask your health care provider or pharmacist if you have questions. COMMON BRAND NAME(S): Reclast What should I tell my care team before I take this medication? They need to know if you have any of these conditions: Bleeding disorder Cancer Dental disease Kidney disease Low levels of calcium  in the blood Low red blood cell counts Lung or breathing disease, such as asthma Receiving steroids, such as dexamethasone  or prednisone  An unusual or allergic reaction to zoledronic acid, other medications, foods, dyes, or preservatives Pregnant or trying to get pregnant Breast-feeding How should I use this medication? This medication is injected into a vein. It is given by your care team in a hospital or clinic setting. A special MedGuide will be given to you before each treatment. Be sure to read this information carefully each time. Talk to your care team about the use of this medication in children. Special care may be needed. Overdosage: If you think you have taken too much of this medicine contact a poison control center or emergency room at once. NOTE: This medicine is only for you. Do not share this medicine with others. What if I miss a dose? Keep appointments for follow-up doses. It is important not to miss your dose. Call your care team if you are unable to keep an appointment. What may interact with this medication? Certain antibiotics given by injection Medications for pain and inflammation, such as ibuprofen , naproxen, NSAIDs Some diuretics, such as bumetanide, furosemide  Teriparatide This list may not  describe all possible interactions. Give your health care provider a list of all the medicines, herbs, non-prescription drugs, or dietary supplements you use. Also tell them if you smoke, drink alcohol, or use illegal drugs. Some items may interact with your medicine. What should I watch for while using this medication? Visit your care team for regular checks on your progress. It may be some time before you see the benefit from this medication. Some people who take this medication have severe bone, joint, or muscle pain. This medication may also increase your risk for jaw problems or a broken thigh bone. Tell your care team right away if you have severe pain in your jaw, bones, joints, or muscles. Tell your care team if you have any pain that does not go away or that gets worse. You should make sure you get enough calcium  and vitamin D  while you are taking this medication. Discuss the foods you eat and the vitamins you take with your care team. You may need bloodwork while taking this medication. Tell your dentist and dental surgeon that you are taking this medication. You should not have major dental surgery while on this medication. See your dentist to have a dental exam and fix any dental problems before starting this medication. Take good care of your teeth while on this medication. Make sure you see your dentist for regular follow-up appointments. What side effects may I notice from receiving this medication? Side effects that you should report to your care team as soon as possible: Allergic reactions--skin rash, itching, hives, swelling of the face, lips, tongue, or throat Kidney injury--decrease in the amount of urine,  swelling of the ankles, hands, or feet Low calcium  level--muscle pain or cramps, confusion, tingling, or numbness in the hands or feet Osteonecrosis of the jaw--pain, swelling, or redness in the mouth, numbness of the jaw, poor healing after dental work, unusual discharge from the  mouth, visible bones in the mouth Severe bone, joint, or muscle pain Side effects that usually do not require medical attention (report to your care team if they continue or are bothersome): Diarrhea Dizziness Headache Nausea Stomach pain Vomiting This list may not describe all possible side effects. Call your doctor for medical advice about side effects. You may report side effects to FDA at 1-800-FDA-1088. Where should I keep my medication? This medication is given in a hospital or clinic. It will not be stored at home. NOTE: This sheet is a summary. It may not cover all possible information. If you have questions about this medicine, talk to your doctor, pharmacist, or health care provider.  2024 Elsevier/Gold Standard (2021-11-07 00:00:00)Alendronate Weekly Tablets What is this medication? ALENDRONATE (a LEN droe nate) treats osteoporosis. It works by Interior and spatial designer stronger and less likely to break (fracture). It belongs to a group of medications called bisphosphonates. This medicine may be used for other purposes; ask your health care provider or pharmacist if you have questions. COMMON BRAND NAME(S): Fosamax What should I tell my care team before I take this medication? They need to know if you have any of these conditions: Bleeding disorder Cancer Dental disease Difficulty swallowing Infection (fever, chills, cough, sore throat, pain or trouble passing urine) Kidney disease Low levels of calcium  or other minerals in the blood Low red blood cell counts Receiving steroids like dexamethasone  or prednisone  Stomach or intestine problems Trouble sitting or standing for 30 minutes An unusual or allergic reaction to alendronate, other medications, foods, dyes or preservatives Pregnant or trying to get pregnant Breast-feeding How should I use this medication? Take this medication by mouth with a full glass of water. Take it as directed on the prescription label at the same day  of each week. Take the dose right after waking up. Do not eat or drink anything before taking it. Do not take it with any other drink except water. Do not chew or crush the tablet. After taking it, do not eat breakfast, drink, or take any other medications or vitamins for at least 30 minutes. Sit or stand up for at least 30 minutes after you take it. Do not lie down. Keep taking it unless your care team tells you to stop. A special MedGuide will be given to you by the pharmacist with each prescription and refill. Be sure to read this information carefully each time. Talk to your care team about the use of this medication in children. Special care may be needed. Overdosage: If you think you have taken too much of this medicine contact a poison control center or emergency room at once. NOTE: This medicine is only for you. Do not share this medicine with others. What if I miss a dose? If you take your medication once a day, skip it. Take your next dose at the scheduled time the next morning. Do not take two doses on the same day. If you take your medication once a week, take the missed dose on the morning after you remember. Do not take two doses on the same day. What may interact with this medication? Aluminum hydroxide Antacids Aspirin Calcium  supplements Iron supplements Medications for inflammation like ibuprofen , naproxen, and others  Magnesium supplements Vitamins with minerals This list may not describe all possible interactions. Give your health care provider a list of all the medicines, herbs, non-prescription drugs, or dietary supplements you use. Also tell them if you smoke, drink alcohol, or use illegal drugs. Some items may interact with your medicine. What should I watch for while using this medication? Visit your care team for regular checks on your progress. It may be some time before you see the benefit from this medication. Some people who take this medication have severe bone,  joint, or muscle pain. This medication may also increase your risk for jaw problems or a broken thigh bone. Tell your care team right away if you have severe pain in your jaw, bones, joints, or muscles. Tell you care team if you have any pain that does not go away or that gets worse. Tell your dentist and dental surgeon that you are taking this medication. You should not have major dental surgery while on this medication. See your dentist to have a dental exam and fix any dental problems before starting this medication. Take good care of your teeth while on this medication. Make sure you see your dentist for regular follow-up appointments. You should make sure you get enough calcium  and vitamin D  while you are taking this medication. Discuss the foods you eat and the vitamins you take with your care team. You may need blood work done while you are taking this medication. What side effects may I notice from receiving this medication? Side effects that you should report to your care team as soon as possible: Allergic reactions--skin rash, itching, hives, swelling of the face, lips, tongue, or throat Low calcium  level--muscle pain or cramps, confusion, tingling, or numbness in the hands or feet Osteonecrosis of the jaw--pain, swelling, or redness in the mouth, numbness of the jaw, poor healing after dental work, unusual discharge from the mouth, visible bones in the mouth Pain or trouble swallowing Severe bone, joint, or muscle pain Stomach bleeding--bloody or black, tar-like stools, vomiting blood or brown material that looks like coffee grounds Side effects that usually do not require medical attention (report to your care team if they continue or are bothersome): Constipation Diarrhea Nausea Stomach pain This list may not describe all possible side effects. Call your doctor for medical advice about side effects. You may report side effects to FDA at 1-800-FDA-1088. Where should I keep my  medication? Keep out of the reach of children and pets. Store at room temperature between 15 and 30 degrees C (59 and 86 degrees F). Throw away any unused medication after the expiration date. NOTE: This sheet is a summary. It may not cover all possible information. If you have questions about this medicine, talk to your doctor, pharmacist, or health care provider.  2024 Elsevier/Gold Standard (2020-09-16 00:00:00)Bone Health Bones protect organs, store calcium , anchor muscles, and support the whole body. Keeping your bones strong is important, especially as you get older. You can take actions to help keep your bones strong and healthy. Why is keeping my bones healthy important?  Keeping your bones healthy is important because your body constantly replaces bone cells. Cells get old, and new cells take their place. As we age, we lose bone cells because the body may not be able to make enough new cells to replace the old cells. The amount of bone cells and bone tissue you have is referred to as bone mass. The higher your bone mass, the stronger your bones.  The aging process leads to an overall loss of bone mass in the body, which can increase the likelihood of: Broken bones. A condition in which the bones become weak and brittle (osteoporosis). A large decline in bone mass occurs in older adults. In women, it occurs about the time of menopause. What actions can I take to keep my bones healthy? Good health habits are important for maintaining healthy bones. This includes eating nutritious foods and exercising regularly. To have healthy bones, you need to get enough of the right minerals and vitamins. Most nutrition experts recommend getting these nutrients from the foods that you eat. In some cases, taking supplements may also be recommended. Doing certain types of exercise is also important for bone health. What are the nutritional recommendations for healthy bones?  Eating a well-balanced diet with  plenty of calcium  and vitamin D  will help to protect your bones. Nutritional recommendations vary from person to person. Ask your health care provider what is healthy for you. Here are some general guidelines. Get enough calcium  Calcium  is the most important (essential) mineral for bone health. Most people can get enough calcium  from their diet, but supplements may be recommended for people who are at risk for osteoporosis. Good sources of calcium  include: Dairy products, such as low-fat or nonfat milk, cheese, and yogurt. Dark green leafy vegetables, such as bok choy and broccoli. Foods that have calcium  added to them (are fortified). Foods that may be fortified with calcium  include orange juice, cereal, bread, soy beverages, and tofu products. Nuts, such as almonds. Follow these recommended amounts for daily calcium  intake: Infants, 0-6 months: 200 mg. Infants, 6-12 months: 260 mg. Children, age 34-3: 700 mg. Children, age 32-8: 1,000 mg. Children, age 39-13: 1,300 mg. Teens, age 60-18: 1,300 mg. Adults, age 75-50: 1,000 mg. Adults, age 99-70: Men: 1,000 mg. Women: 1,200 mg. Adults, age 343 or older: 1,200 mg. Pregnant and breastfeeding females: Teens: 1,300 mg. Adults: 1,000 mg. Get enough vitamin D  Vitamin D  is the most essential vitamin for bone health. It helps the body absorb calcium . Sunlight stimulates the skin to make vitamin D , so be sure to get enough sunlight. If you live in a cold climate or you do not get outside often, your health care provider may recommend that you take vitamin D  supplements. Good sources of vitamin D  in your diet include: Egg yolks. Saltwater fish. Milk and cereal fortified with vitamin D . Follow these recommended amounts for daily vitamin D  intake: Infants, 0-12 months: 400 international units (IU). Children and teens, age 34-18: 600 international units. Adults, age 73 or younger: 600 international units. Adults, age 21 or older: 600-1,000 international  units. Get other important nutrients Other nutrients that are important for bone health include: Phosphorus. This mineral is found in meat, poultry, dairy foods, nuts, and legumes. The recommended daily intake for adult men and adult women is 700 mg. Magnesium. This mineral is found in seeds, nuts, dark green vegetables, and legumes. The recommended daily intake for adult men is 400-420 mg. For adult women, it is 310-320 mg. Vitamin K. This vitamin is found in green leafy vegetables. The recommended daily intake is 120 mcg for adult men and 90 mcg for adult women. What type of physical activity is best for building and maintaining healthy bones? Weight-bearing and strength-building activities are important for building and maintaining healthy bones. Weight-bearing activities cause muscles and bones to work against gravity. Strength-building activities increase the strength of the muscles that support bones. Weight-bearing  and muscle-building activities include: Walking and hiking. Jogging and running. Dancing. Gym exercises. Lifting weights. Tennis and racquetball. Climbing stairs. Aerobics. Adults should get at least 30 minutes of moderate physical activity on most days. Children should get at least 60 minutes of moderate physical activity on most days. Ask your health care provider what type of exercise is best for you. How can I find out if my bone mass is low? Bone mass can be measured with an X-ray test called a bone mineral density (BMD) test. This test is recommended for all women who are age 72 or older. It may also be recommended for: Men who are age 72 or older. People who are at risk for osteoporosis because of: Having a long-term disease that weakens bones, such as kidney disease or rheumatoid arthritis. Having menopause earlier than normal. Taking medicine that weakens bones, such as steroids, thyroid  hormones, or hormone treatment for breast cancer or prostate  cancer. Smoking. Drinking three or more alcoholic drinks a day. Being underweight. Sedentary lifestyle. If you find that you have a low bone mass, you may be able to prevent osteoporosis or further bone loss by changing your diet and lifestyle. Where can I find more information? Bone Health & Osteoporosis Foundation: https://carlson-fletcher.info/ [employer]: www.bones.http://www.myers.net/ International Osteoporosis Foundation: [employer], operational.iofbonehealth.org Summary The aging process leads to an overall loss of bone mass in the body, which can increase the likelihood of broken bones and osteoporosis. Eating a well-balanced diet with plenty of calcium  and vitamin D  will help to protect your bones. Weight-bearing and strength-building activities are also important for building and maintaining strong bones. Bone mass can be measured with an X-ray test called a bone mineral density (BMD) test. This information is not intended to replace advice given to you by your health care provider. Make sure you discuss any questions you have with your health care provider. Document Revised: 03/05/2021 Document Reviewed: 03/05/2021 Elsevier Patient Education  2024 [employer].

## 2024-07-03 NOTE — Progress Notes (Signed)
  Cancer Center Cancer Follow up:    Cheyenne Rollene BRAVO, MD [address] [place] [address]   DIAGNOSIS:  Cancer Staging  Ductal carcinoma in situ (DCIS) of left breast Staging form: Breast, AJCC 8th Edition - Clinical stage from 12/29/2023: Stage 0 (cTis (DCIS), cN0, cM0, ER+, PR-, HER2: Not Assessed) - Unsigned Stage prefix: Initial diagnosis Nuclear grade: G2 Laterality: Left Staged by: Pathologist and managing physician Stage used in treatment planning: Yes National guidelines used in treatment planning: Yes Type of national guideline used in treatment planning: NCCN  I connected with Alan CHRISTELLA Portugal on 07/03/24 at  1:40 PM EDT by telephone and verified that I am speaking with the correct person using two identifiers.  I discussed the limitations, risks, security and privacy concerns of performing an evaluation and management service by telephone and the availability of in person appointments.  I also discussed with the patient that there may be a patient responsible charge related to this service. The patient expressed understanding and agreed to proceed.  Patient location: home Provider location: [facility] office  SUMMARY OF ONCOLOGIC HISTORY: Oncology History  Ductal carcinoma in situ (DCIS) of left breast  12/13/2023 Mammogram   Mammogram showed possible asymmetry and separate calcifications in the left breast. Diagnostic mammogram showed indeterminate 1.2 cm group of calcs in the lower outer left breast.   12/22/2023 Pathology Results   Left breast needle core biopsy showed 1.2 cms group of outer calcs, DCIS, intermediate grade, ER 100%, PR 0%   01/04/2024 Genetic Testing   Negative [facility] CustomNext-Cancer +RNAinsight Panel.  Report date is 01/12/2024.   The [facility] CustomNext-Cancer +RNAinsight Panel (CancerNext + kidney cancer genes) includes sequencing, deletion/duplication, and RNA analysis for the following 44 genes:  APC, ATM, BAP1, BARD1, BMPR1A,  BRCA1, BRCA2, BRIP1, CDH1, CDKN2A, CHEK2, FH, FLCN, MAX, MET, MLH1, MSH2, MSH6, MUTYH, NF1, NTHL1, PALB2, PMS2, PTEN, RAD51C, RAD51D, SDHA, SDHB, SDHC, SDHD, SMAD4, STK11, TP53, TSC1, TSC2 and VHL (sequencing and deletion/duplication); AXIN2, HOXB13, MBD4, MSH3, POLD1 and POLE (sequencing only); EPCAM and GREM1 (deletion/duplication only).     02/09/2024 - 03/08/2024 Radiation Therapy   Plan Name: Breast_L_BH Site: Breast, Left Technique: 3D Mode: Photon Dose Per Fraction: 2.66 Gy Prescribed Dose (Delivered / Prescribed): 42.56 Gy / 42.56 Gy Prescribed Fxs (Delivered / Prescribed): 16 / 16   Plan Name: Brst_L_Bst_BH Site: Breast, Left Technique: 3D Mode: Photon Dose Per Fraction: 2 Gy Prescribed Dose (Delivered / Prescribed): 8 Gy / 8 Gy Prescribed Fxs (Delivered / Prescribed): 4 / 4   05/2024 -  Anti-estrogen oral therapy   Anastrozole      CURRENT THERAPY:  INTERVAL HISTORY:  Discussed the use of AI scribe software for clinical note transcription with the patient, who gave verbal consent to proceed.  History of Present Illness [name] is a 58 year old female with stage zero left breast DCIS who presents with concerns about bone density results.  She was diagnosed with stage zero left breast ductal carcinoma in situ (DCIS) in March 2025 and underwent a lumpectomy followed by adjuvant radiation and antiestrogen therapy with anastrozole .  Recent bone density testing on December 18, 2023, shows a T score of -2.9 in the right hip and -2.3 in the left hip, with osteopenia in the spine. She is concerned about the risk of fractures associated with these results.  She engages in weightlifting exercises two to three times a week for her lower body. She does not take a vitamin  D supplement and inquires about the need for supplementation and the potential use of bisphosphonates for bone health.     Patient Active Problem List   Diagnosis Date Noted   Decreased libido 05/05/2024    Genetic testing 01/05/2024   Ductal carcinoma in situ (DCIS) of left breast 12/27/2023   Encounter for annual health examination 07/07/2023   Dyspepsia 07/05/2017   Motion sickness 02/16/2017   Allergic rhinitis 12/13/2015   Overweight (BMI 25.0-29.9) 05/26/2015   Essential hypertension 05/23/2015   Hyperlipidemia LDL goal <100 01/11/2008    is allergic to sulfa antibiotics.  MEDICAL HISTORY: Past Medical History:  Diagnosis Date   Complication of anesthesia    Hyperlipidemia    Hypertension    Insomnia due to anxiety and fear 01/05/2024   PONV (postoperative nausea and vomiting)     SURGICAL HISTORY: Past Surgical History:  Procedure Laterality Date   ABDOMINAL HYSTERECTOMY     BILATERAL SALPINGECTOMY Bilateral 03/12/2015   Procedure: BILATERAL SALPINGECTOMY;  Surgeon: Norleen Edsel GAILS, MD;  Location: [facility];  Service: Gynecology;  Laterality: Bilateral;   BREAST BIOPSY Left 12/22/2023   MM LT BREAST BX W LOC DEV 1ST LESION IMAGE BX SPEC STEREO GUIDE 12/22/2023 [facility]   BREAST BIOPSY  01/05/2024   MM LT RADIOACTIVE SEED LOC MAMMO GUIDE 01/05/2024 [facility]   BREAST LUMPECTOMY WITH RADIOACTIVE SEED LOCALIZATION Left 01/06/2024   Procedure: BREAST LUMPECTOMY WITH RADIOACTIVE SEED LOCALIZATION;  Surgeon: Aron Shoulders, MD;  Location: [facility];  Service: General;  Laterality: Left;   COLONOSCOPY N/A 04/13/2018   Procedure: COLONOSCOPY;  Surgeon: Golda Claudis PENNER, MD;  Location: [facility];  Service: Endoscopy;  Laterality: N/A;  730   MENISCUS REPAIR Right 11/24/2023   [facility]   SUPRACERVICAL ABDOMINAL HYSTERECTOMY N/A 03/12/2015   Procedure: HYSTERECTOMY SUPRACERVICAL ABDOMINAL;  Surgeon: Norleen Edsel GAILS, MD;  Location: [facility];  Service: Gynecology;  Laterality: N/A;   WISDOM TOOTH EXTRACTION      SOCIAL HISTORY: Social History   Socioeconomic History   Marital status: Married    Spouse name: Not on file   Number of children: Not  on file   Years of education: Not on file   Highest education level: Associate degree: occupational, Scientist, product/process development, or vocational program  Occupational History   Not on file  Tobacco Use   Smoking status: Never   Smokeless tobacco: Never  Vaping Use   Vaping status: Never Used  Substance and Sexual Activity   Alcohol use: Yes    Alcohol/week: 0.0 standard drinks of alcohol    Comment: occasionally   Drug use: No   Sexual activity: Yes    Birth control/protection: Surgical  Other Topics Concern   Not on file  Social History Narrative   Not on file   Social Drivers of Health   Financial Resource Strain: Low Risk  (05/01/2024)   Overall Financial Resource Strain (CARDIA)    Difficulty of Paying Living Expenses: Not hard at all  Food Insecurity: No Food Insecurity (05/01/2024)   Hunger Vital Sign    Worried About Running Out of Food in the Last Year: Never true    Ran Out of Food in the Last Year: Never true  Transportation Needs: No Transportation Needs (05/01/2024)   PRAPARE - Administrator, Civil Service (Medical): No    Lack of Transportation (Non-Medical): No  Physical Activity: Sufficiently Active (05/01/2024)   Exercise Vital Sign    Days of Exercise per  Week: 5 days    Minutes of Exercise per Session: 40 min  Stress: No Stress Concern Present (05/01/2024)   [employer] of Occupational Health - Occupational Stress Questionnaire    Feeling of Stress: Only a little  Social Connections: Socially Integrated (05/01/2024)   Social Connection and Isolation Panel    Frequency of Communication with Friends and Family: More than three times a week    Frequency of Social Gatherings with Friends and Family: Once a week    Attends Religious Services: More than 4 times per year    Active Member of [employer] or Organizations: Yes    Attends Banker Meetings: 1 to 4 times per year    Marital Status: Married  Catering manager Violence: Not At Risk (01/27/2024)    Humiliation, Afraid, Rape, and Kick questionnaire    Fear of Current or Ex-Partner: No    Emotionally Abused: No    Physically Abused: No    Sexually Abused: No    FAMILY HISTORY: Family History  Problem Relation Age of Onset   Hypertension Mother    Throat cancer Father        d. 31   Hypertension Sister    Hypertension Sister    Hypertension Brother    Breast cancer Paternal Aunt        x3 pat aunts; some dx before age 54   Diabetes Maternal Grandmother    Heart disease Maternal Grandmother    Kidney cancer Maternal Grandmother 46   Breast cancer Other        distant maternal cousin w/ breast cancer dx <50    Review of Systems  Constitutional:  Negative for appetite change, chills, fatigue, fever and unexpected weight change.  HENT:   Negative for hearing loss, lump/mass and trouble swallowing.   Eyes:  Negative for eye problems and icterus.  Respiratory:  Negative for chest tightness, cough and shortness of breath.   Cardiovascular:  Negative for chest pain, leg swelling and palpitations.  Gastrointestinal:  Negative for abdominal distention, abdominal pain, constipation, diarrhea, nausea and vomiting.  Endocrine: Negative for hot flashes.  Genitourinary:  Negative for difficulty urinating.   Musculoskeletal:  Negative for arthralgias.  Skin:  Negative for itching and rash.  Neurological:  Negative for dizziness, extremity weakness, headaches and numbness.  Hematological:  Negative for adenopathy. Does not bruise/bleed easily.  Psychiatric/Behavioral:  Negative for depression. The patient is not nervous/anxious.       PHYSICAL EXAMINATION patient sounds well, in no apparent     ASSESSMENT and THERAPY PLAN:   Assessment and Plan Assessment & Plan Osteoporosis of right hip and osteopenia of spine and left hip Osteoporosis in right hip (T score -2.9) and osteopenia in spine and left hip (left hip T score -2.3). Discussed fracture risk, T score implications, and  treatment options including lifestyle changes and pharmacotherapy. Explained bisphosphonate therapy and its side effects. Recommended dental evaluation before therapy. She opted for lifestyle modifications with reassessment in two years. - Recommend vitamin D3 supplementation (1000-2000 units daily). - Continue weight-bearing exercises. - Optimize calcium  intake through diet. - Conduct fall risk assessment and mitigation. - Avoid tobacco and excessive alcohol use. - Provide bone health information via MyChart. - Reassess bone density in two years.    Follow up instructions:    -Return to cancer center 01/2025 for f/u with Dr. Iruku  -DEXA in 2 years  The patient was provided an opportunity to ask questions and all were answered. The patient  agreed with the plan and demonstrated an understanding of the instructions.   The patient was advised to call back or seek an in-person evaluation if the symptoms worsen or if the condition fails to improve as anticipated.   I provided 10 minutes of non face-to-face telephone visit time during this encounter, and > 50% was spent counseling as documented under my assessment & plan.   Morna Kendall, NP 07/03/24 2:34 PM Medical Oncology and Hematology [facility] [address] [place], [place] [address] Tel. [phone]    Fax. [phone]  *Total Encounter Time as defined by the Centers for Medicare and Medicaid Services includes, in addition to the face-to-face time of a patient visit (documented in the note above) non-face-to-face time: obtaining and reviewing outside history, ordering and reviewing medications, tests or procedures, care coordination (communications with other health care professionals or caregivers) and documentation in the medical record.

## 2024-09-07 ENCOUNTER — Ambulatory Visit: Admitting: Family Medicine

## 2024-09-07 ENCOUNTER — Encounter: Payer: Self-pay | Admitting: Family Medicine

## 2024-09-07 VITALS — BP 130/79 | HR 70 | Resp 16 | Ht 64.0 in | Wt 153.0 lb

## 2024-09-07 DIAGNOSIS — E559 Vitamin D deficiency, unspecified: Secondary | ICD-10-CM

## 2024-09-07 DIAGNOSIS — Z23 Encounter for immunization: Secondary | ICD-10-CM

## 2024-09-07 DIAGNOSIS — Z0000 Encounter for general adult medical examination without abnormal findings: Secondary | ICD-10-CM

## 2024-09-07 DIAGNOSIS — E785 Hyperlipidemia, unspecified: Secondary | ICD-10-CM | POA: Diagnosis not present

## 2024-09-07 DIAGNOSIS — I10 Essential (primary) hypertension: Secondary | ICD-10-CM | POA: Diagnosis not present

## 2024-09-07 MED ORDER — ACETAMINOPHEN ER 650 MG PO TBCR: EXTENDED_RELEASE_TABLET | ORAL | 11 refills | Status: AC

## 2024-09-07 MED ORDER — AMLODIPINE BESYLATE 2.5 MG PO TABS: ORAL_TABLET | ORAL | 11 refills | Status: AC

## 2024-09-07 MED ORDER — ALENDRONATE SODIUM 70 MG PO TABS: 70.0000 mg | ORAL_TABLET | ORAL | 11 refills | Status: AC

## 2024-09-07 MED ORDER — OYSTER SHELL CALCIUM/D3 500-5 MG-MCG PO TABS: 1.0000 | ORAL_TABLET | Freq: Two times a day (BID) | ORAL | 11 refills | Status: AC

## 2024-09-07 NOTE — Patient Instructions (Signed)
 F/U in 5 months  Pneumonia 20 today  New for bone building is daily calcium  with vit d and once weekl alendronate  Commitment to weight bearing exercise like walking for atleast 30 minutes every dauy will also help to strengthen your bones and reduce fracture risk  Fasting lipid, cmp and EGFR and vit D in 8 weeks  New is amlodipine  2.5 mg THREE tabs daily for blood pressure  Thanks for choosing  Primary Care, we consider it a privelige to serve you.

## 2024-09-10 DIAGNOSIS — Z23 Encounter for immunization: Secondary | ICD-10-CM | POA: Insufficient documentation

## 2024-09-10 NOTE — Assessment & Plan Note (Signed)
 After obtaining informed consent, the  pneumonia vaccine is  administered , with no adverse effect noted at the time of administration.

## 2024-09-10 NOTE — Progress Notes (Signed)
    Cheyenne Harris     MRN: [id number]      DOB: [birth year]  Chief Complaint  Patient presents with   Annual Exam    Cpe     HPI: Patient is in for annual physical exam. No other health concerns are expressed or addressed at the visit. Recent labs,  are reviewed. Immunization is reviewed , and  updated if needed.   PE: BP 130/79   Pulse 70   Resp 16   Ht 5' 4 (1.626 m)   Wt 153 lb (69.4 kg)   LMP 10/11/2014 Comment: hysterectomy  SpO2 99%   BMI 26.26 kg/m   Pleasant  female, alert and oriented x 3, in no cardio-pulmonary distress. Afebrile. HEENT No facial trauma or asymetry. Sinuses non tender.  Extra occullar muscles intact.. External ears normal, . Neck: supple, no adenopathy,JVD or thyromegaly.No bruits.  Chest: Clear to ascultation bilaterally.No crackles or wheezes. Non tender to palpation  Cardiovascular system; Heart sounds normal,  S1 and  S2 ,no S3.  No murmur, or thrill. Apical beat not displaced Peripheral pulses normal.  Abdomen: Soft, non tender, no organomegaly or masses. No bruits. Bowel sounds normal. No guarding, tenderness or rebound.   Musculoskeletal exam: Full ROM of spine, hips , shoulders and knees. No deformity ,swelling or crepitus noted. No muscle wasting or atrophy.   Neurologic: Cranial nerves 2 to 12 intact. Power, tone ,sensation and reflexes normal throughout. No disturbance in gait. No tremor.  Skin: Intact, no ulceration, erythema , scaling or rash noted. Pigmentation normal throughout  Psych; Normal mood and affect. Judgement and concentration normal   Assessment & Plan:  Encounter for annual health examination Annual exam as documented. Counseling done  re healthy lifestyle involving commitment to 150 minutes exercise per week, heart healthy diet, and attaining healthy weight.The importance of adequate sleep also discussed. Immunization and cancer screening needs are specifically addressed at this  visit.   Immunization due After obtaining informed consent, the pneumonia  vaccine is  administered , with no adverse effect noted at the time of administration.

## 2024-09-10 NOTE — Assessment & Plan Note (Addendum)
 Annual exam as documented. Counseling done  re healthy lifestyle involving commitment to 150 minutes exercise per week, heart healthy diet, and attaining healthy weight.The importance of adequate sleep also discussed.  Immunization and cancer screening needs are specifically addressed at this visit.

## 2024-09-27 ENCOUNTER — Other Ambulatory Visit: Payer: Self-pay | Admitting: Family Medicine

## 2024-12-25 ENCOUNTER — Encounter

## 2025-01-05 ENCOUNTER — Inpatient Hospital Stay: Admitting: Hematology and Oncology

## 2025-02-21 ENCOUNTER — Ambulatory Visit: Admitting: Family Medicine
